# Patient Record
Sex: Male | Born: 1985 | Race: White | Hispanic: No | Marital: Married | Smoking: Never smoker
Health system: Southern US, Community
[De-identification: ages and names within clinical notes are randomized; demographics above are authoritative.]

## PROBLEM LIST (undated history)

## (undated) DIAGNOSIS — F419 Anxiety disorder, unspecified: Secondary | ICD-10-CM

## (undated) DIAGNOSIS — Z87442 Personal history of urinary calculi: Secondary | ICD-10-CM

## (undated) DIAGNOSIS — I10 Essential (primary) hypertension: Secondary | ICD-10-CM

## (undated) DIAGNOSIS — R112 Nausea with vomiting, unspecified: Secondary | ICD-10-CM

## (undated) DIAGNOSIS — F32A Depression, unspecified: Secondary | ICD-10-CM

## (undated) DIAGNOSIS — D649 Anemia, unspecified: Secondary | ICD-10-CM

## (undated) DIAGNOSIS — E559 Vitamin D deficiency, unspecified: Secondary | ICD-10-CM

## (undated) DIAGNOSIS — Z9889 Other specified postprocedural states: Secondary | ICD-10-CM

## (undated) DIAGNOSIS — E785 Hyperlipidemia, unspecified: Secondary | ICD-10-CM

## (undated) DIAGNOSIS — K219 Gastro-esophageal reflux disease without esophagitis: Secondary | ICD-10-CM

## (undated) HISTORY — DX: Hyperlipidemia, unspecified: E78.5

## (undated) HISTORY — PX: OTHER SURGICAL HISTORY: SHX169

## (undated) HISTORY — DX: Essential (primary) hypertension: I10

## (undated) HISTORY — PX: WISDOM TOOTH EXTRACTION: SHX21

## (undated) HISTORY — PX: KNEE SURGERY: SHX244

---

## 2007-01-22 ENCOUNTER — Emergency Department (HOSPITAL_COMMUNITY): Admission: EM | Admit: 2007-01-22 | Discharge: 2007-01-23 | Payer: Self-pay | Admitting: Emergency Medicine

## 2007-02-14 ENCOUNTER — Encounter: Admission: RE | Admit: 2007-02-14 | Discharge: 2007-02-14 | Payer: Self-pay | Admitting: Orthopedic Surgery

## 2009-05-25 ENCOUNTER — Ambulatory Visit: Payer: Self-pay | Admitting: Internal Medicine

## 2009-05-25 DIAGNOSIS — E669 Obesity, unspecified: Secondary | ICD-10-CM | POA: Insufficient documentation

## 2009-05-25 DIAGNOSIS — R519 Headache, unspecified: Secondary | ICD-10-CM | POA: Insufficient documentation

## 2009-05-25 DIAGNOSIS — E785 Hyperlipidemia, unspecified: Secondary | ICD-10-CM | POA: Insufficient documentation

## 2009-05-25 DIAGNOSIS — R030 Elevated blood-pressure reading, without diagnosis of hypertension: Secondary | ICD-10-CM

## 2009-05-25 DIAGNOSIS — R51 Headache: Secondary | ICD-10-CM

## 2009-05-25 DIAGNOSIS — J45909 Unspecified asthma, uncomplicated: Secondary | ICD-10-CM | POA: Insufficient documentation

## 2009-08-15 ENCOUNTER — Ambulatory Visit: Payer: Self-pay | Admitting: Internal Medicine

## 2009-08-15 DIAGNOSIS — R1011 Right upper quadrant pain: Secondary | ICD-10-CM

## 2009-08-18 ENCOUNTER — Encounter: Admission: RE | Admit: 2009-08-18 | Discharge: 2009-08-18 | Payer: Self-pay | Admitting: Internal Medicine

## 2009-08-31 ENCOUNTER — Ambulatory Visit: Payer: Self-pay | Admitting: Internal Medicine

## 2010-06-29 ENCOUNTER — Emergency Department (HOSPITAL_COMMUNITY)
Admission: EM | Admit: 2010-06-29 | Discharge: 2010-06-29 | Payer: Self-pay | Source: Home / Self Care | Admitting: Emergency Medicine

## 2010-07-03 LAB — URINALYSIS, ROUTINE W REFLEX MICROSCOPIC
Nitrite: NEGATIVE
Specific Gravity, Urine: 1.03 (ref 1.005–1.030)
Urine Glucose, Fasting: NEGATIVE mg/dL
pH: 6 (ref 5.0–8.0)

## 2010-07-13 NOTE — Assessment & Plan Note (Signed)
Summary: STOMACH PAIN /NWS   Vital Signs:  Patient profile:   25 year old male Height:      72 inches Weight:      292 pounds O2 Sat:      97 % on Room air Temp:     98.5 degrees F oral Pulse rate:   72 / minute Pulse rhythm:   regular Resp:     16 per minute BP sitting:   120 / 80  (right arm)  O2 Flow:  Room air  Primary Care Provider:  Etta Grandchild MD  CC:  Abdominal pain.  History of Present Illness:  Abdominal Pain      This is a 25 year old man who presents with Abdominal pain.  The symptoms began 3 days ago.  On a scale of 1 to 10, the intensity is described as a 3.  The patient denies nausea, vomiting, diarrhea, constipation, melena, hematochezia, anorexia, and hematemesis.  The location of the pain is epigastric and right upper quadrant.  The pain is described as intermittent, sharp, and cramping in quality.  The patient denies the following symptoms: fever, weight loss, dysuria, chest pain, jaundice, and dark urine.  The pain is worse with food.    Preventive Screening-Counseling & Management  Alcohol-Tobacco     Alcohol drinks/day: 0     Smoking Status: never  Hep-HIV-STD-Contraception     Hepatitis Risk: no risk noted     HIV Risk: no risk noted     STD Risk: no risk noted     SBE monthly: yes     SBE Education/Counseling: to perform regular SBE  Safety-Violence-Falls     Seat Belt Use: yes     Helmet Use: yes     Firearms in the Home: firearms in the home     Firearm Counseling: to practice firearm safety     Smoke Detectors: yes     Violence in the Home: no risk noted     Sexual Abuse: no      Sexual History:  not active.        Drug Use:  no.        Blood Transfusions:  no.    Medications Prior to Update: 1)  None  Current Medications (verified): 1)  None  Allergies (verified): No Known Drug Allergies  Past History:  Past Medical History: Reviewed history from 05/25/2009 and no changes  required. Asthma Headache-migraine Hyperlipidemia  Past Surgical History: Reviewed history from 05/25/2009 and no changes required. Denies surgical history  Family History: Reviewed history from 05/25/2009 and no changes required. Family History Diabetes 1st degree relative Family History High cholesterol Family History Hypertension  Social History: Reviewed history from 05/25/2009 and no changes required. Occupation: Emergency planning/management officer Single Never Smoked Alcohol use-no Drug use-no Regular exercise-yes Seat Belt Use:  yes Sexual History:  not active  Review of Systems       The patient complains of abdominal pain.  The patient denies anorexia, fever, weight loss, weight gain, chest pain, peripheral edema, prolonged cough, headaches, hemoptysis, melena, hematochezia, severe indigestion/heartburn, hematuria, genital sores, suspicious skin lesions, and enlarged lymph nodes.   GI:  Complains of abdominal pain; denies bloody stools, change in bowel habits, constipation, dark tarry stools, diarrhea, indigestion, loss of appetite, nausea, vomiting, vomiting blood, and yellowish skin color. GU:  Denies discharge, dysuria, hematuria, incontinence, nocturia, urinary frequency, and urinary hesitancy.  Physical Exam  General:  alert, well-developed, well-nourished, well-hydrated, appropriate dress, normal appearance, healthy-appearing,  cooperative to examination, good hygiene, and overweight-appearing.   Head:  normocephalic, atraumatic, no abnormalities observed, and no abnormalities palpated.   Eyes:  vision grossly intact, pupils equal, pupils round, and pupils reactive to light.  no icterus. Ears:  R ear normal and L ear normal.   Mouth:  Oral mucosa and oropharynx without lesions or exudates.  Teeth in good repair. Neck:  supple, full ROM, no masses, normal carotid upstroke, no carotid bruits, no cervical lymphadenopathy, and no neck tenderness.   Lungs:  normal respiratory effort, no  intercostal retractions, no accessory muscle use, normal breath sounds, no dullness, and no fremitus.   Heart:  normal rate, regular rhythm, no murmur, no gallop, no rub, no JVD, and no HJR.   Abdomen:  soft, non-tender, normal bowel sounds, no distention, no masses, no guarding, no rigidity, no rebound tenderness, no hepatomegaly, and no splenomegaly.   Rectal:  No external abnormalities noted. Normal sphincter tone. No rectal masses or tenderness. heme neg.stool. Genitalia:  circumcised, no hydrocele, no varicocele, no scrotal masses, no testicular masses or atrophy, no cutaneous lesions, and no urethral discharge.   Prostate:  Prostate gland firm and smooth, no enlargement, nodularity, tenderness, mass, asymmetry or induration. Msk:  No deformity or scoliosis noted of thoracic or lumbar spine.   Pulses:  R and L carotid,radial,femoral,dorsalis pedis and posterior tibial pulses are full and equal bilaterally Extremities:  No clubbing, cyanosis, edema, or deformity noted with normal full range of motion of all joints.   Neurologic:  No cranial nerve deficits noted. Station and gait are normal. Plantar reflexes are down-going bilaterally. DTRs are symmetrical throughout. Sensory, motor and coordinative functions appear intact. Skin:  Intact without suspicious lesions or rashes Cervical Nodes:  no anterior cervical adenopathy and no posterior cervical adenopathy.   Psych:  Cognition and judgment appear intact. Alert and cooperative with normal attention span and concentration. No apparent delusions, illusions, hallucinations   Impression & Recommendations:  Problem # 1:  ABDOMINAL PAIN, RIGHT UPPER QUADRANT (ICD-789.01) Assessment New this sounds like gallstones, he is feeling much better today so no treatment is needed at this time Orders: Radiology Referral (Radiology) Hemoccult Guaiac-1 spec.(in office) ([id number])  Problem # 2:  ELEVATED BLOOD PRESSURE WITHOUT DIAGNOSIS OF HYPERTENSION  (ICD-796.2) Assessment: Improved  BP today: 120/80 Prior BP: 130/90 (05/25/2009)  Instructed in low sodium diet (DASH Handout) and behavior modification.    Orders: Hemoccult Guaiac-1 spec.(in office) ([id number])  Patient Instructions: 1)  Please schedule a follow-up appointment in 2 weeks.

## 2010-07-13 NOTE — Assessment & Plan Note (Signed)
Summary: 2 wk f/u // # / cd   Vital Signs:  Patient profile:   25 year old male Height:      72 inches Weight:      285 pounds BMI:     38.79 O2 Sat:      96 % on Room air Temp:     98.2 degrees F oral Pulse rate:   74 / minute Pulse rhythm:   regular Resp:     16 per minute BP sitting:   120 / 86  (left arm) Cuff size:   large  Vitals Entered By: Rock Nephew CMA (August 31, 2009 3:34 PM)  Nutrition Counseling: Patient's BMI is greater than 25 and therefore counseled on weight management options.  O2 Flow:  Room air CC: follow-up visit 2wk Is Patient Diabetic? No Pain Assessment Patient in pain? no        Primary Care Provider:  Etta Grandchild MD  CC:  follow-up visit 2wk.  History of Present Illness: He returns for a recheck and he says that all of his symptoms have resolved and he feels well. His abd. u/s was normal.  Preventive Screening-Counseling & Management  Alcohol-Tobacco     Alcohol drinks/day: 0     Smoking Status: never  Hep-HIV-STD-Contraception     Hepatitis Risk: no risk noted     HIV Risk: no risk noted     STD Risk: no risk noted     SBE monthly: yes     SBE Education/Counseling: to perform regular SBE      Sexual History:  not active.        Drug Use:  no.        Blood Transfusions:  no.    Medications Prior to Update: 1)  None  Current Medications (verified): 1)  None  Allergies (verified): No Known Drug Allergies  Past History:  Past Medical History: Reviewed history from 05/25/2009 and no changes required. Asthma Headache-migraine Hyperlipidemia  Past Surgical History: Reviewed history from 05/25/2009 and no changes required. Denies surgical history  Family History: Reviewed history from 05/25/2009 and no changes required. Family History Diabetes 1st degree relative Family History High cholesterol Family History Hypertension  Social History: Reviewed history from 05/25/2009 and no changes  required. 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   GI:  Denies abdominal pain, bloody stools, change in bowel habits, diarrhea, gas, indigestion, loss of appetite, nausea, vomiting, and yellowish skin color.  Physical Exam  General:  alert, well-developed, well-nourished, well-hydrated, appropriate dress, normal appearance, healthy-appearing, cooperative to examination, good hygiene, and overweight-appearing.   Head:  normocephalic, atraumatic, no abnormalities observed, and no abnormalities palpated.   Eyes:  vision grossly intact, pupils equal, pupils round, and pupils reactive to light.  no icterus. Mouth:  Oral mucosa and oropharynx without lesions or exudates.  Teeth in good repair. Neck:  supple, full ROM, no masses, normal carotid upstroke, no carotid bruits, no cervical lymphadenopathy, and no neck tenderness.   Lungs:  normal respiratory effort, no intercostal retractions, no accessory muscle use, normal breath sounds, no dullness, and no fremitus.   Heart:  normal rate, regular rhythm, no murmur, no gallop, no rub, no JVD, and no HJR.   Abdomen:  soft, non-tender, normal bowel sounds, no distention, no masses, no  guarding, no rigidity, no rebound tenderness, no hepatomegaly, and no splenomegaly.   Msk:  No deformity or scoliosis noted of thoracic or lumbar spine.   Pulses:  R and L carotid,radial,femoral,dorsalis pedis and posterior tibial pulses are full and equal bilaterally Extremities:  No clubbing, cyanosis, edema, or deformity noted with normal full range of motion of all joints.   Neurologic:  No cranial nerve deficits noted. Station and gait are normal. Plantar reflexes are down-going bilaterally. DTRs  are symmetrical throughout. Sensory, motor and coordinative functions appear intact. Skin:  Intact without suspicious lesions or rashes Cervical Nodes:  no anterior cervical adenopathy and no posterior cervical adenopathy.   Psych:  Cognition and judgment appear intact. Alert and cooperative with normal attention span and concentration. No apparent delusions, illusions, hallucinations   Impression & Recommendations:  Problem # 1:  ABDOMINAL PAIN, RIGHT UPPER QUADRANT (ICD-789.01) Assessment Improved  his symptoms have resolved and there is no clear explanation so will follow for now and he will inform me of any symptoms.  Discussed symptom control with the patient.   Problem # 2:  ELEVATED BLOOD PRESSURE WITHOUT DIAGNOSIS OF HYPERTENSION (ICD-796.2) Assessment: Improved  BP today: 120/86 Prior BP: 120/80 (08/15/2009)  Instructed in low sodium diet (DASH Handout) and behavior modification.    Patient Instructions: 1)  Please schedule a follow-up appointment as needed. 2)  It is important that you exercise regularly at least 20 minutes 5 times a week. If you develop chest pain, have severe difficulty breathing, or feel very tired , stop exercising immediately and seek medical attention. 3)  You need to lose weight. Consider a lower calorie diet and regular exercise.  4)  Check your Blood Pressure regularly. If it is above 140/90: you should make an appointment.

## 2011-02-06 ENCOUNTER — Encounter: Payer: Self-pay | Admitting: Internal Medicine

## 2011-02-06 ENCOUNTER — Other Ambulatory Visit (INDEPENDENT_AMBULATORY_CARE_PROVIDER_SITE_OTHER): Payer: BC Managed Care – PPO

## 2011-02-06 ENCOUNTER — Ambulatory Visit (INDEPENDENT_AMBULATORY_CARE_PROVIDER_SITE_OTHER): Payer: BC Managed Care – PPO | Admitting: Internal Medicine

## 2011-02-06 ENCOUNTER — Other Ambulatory Visit: Payer: Self-pay | Admitting: Internal Medicine

## 2011-02-06 VITALS — BP 140/98 | HR 73 | Temp 97.5°F | Resp 16 | Wt 314.2 lb

## 2011-02-06 DIAGNOSIS — E785 Hyperlipidemia, unspecified: Secondary | ICD-10-CM

## 2011-02-06 DIAGNOSIS — F329 Major depressive disorder, single episode, unspecified: Secondary | ICD-10-CM | POA: Insufficient documentation

## 2011-02-06 DIAGNOSIS — F32A Depression, unspecified: Secondary | ICD-10-CM | POA: Insufficient documentation

## 2011-02-06 DIAGNOSIS — I10 Essential (primary) hypertension: Secondary | ICD-10-CM

## 2011-02-06 DIAGNOSIS — B360 Pityriasis versicolor: Secondary | ICD-10-CM

## 2011-02-06 DIAGNOSIS — E669 Obesity, unspecified: Secondary | ICD-10-CM

## 2011-02-06 LAB — LIPID PANEL
Cholesterol: 237 mg/dL — ABNORMAL HIGH (ref 0–200)
HDL: 38.1 mg/dL — ABNORMAL LOW (ref >39.00)
Total CHOL/HDL Ratio: 6
Triglycerides: 362 mg/dL — ABNORMAL HIGH (ref 0.0–149.0)
VLDL: 72.4 mg/dL — ABNORMAL HIGH (ref 0.0–40.0)

## 2011-02-06 LAB — COMPREHENSIVE METABOLIC PANEL
ALT: 28 U/L (ref 0–53)
AST: 21 U/L (ref 0–37)
Albumin: 4.3 g/dL (ref 3.5–5.2)
BUN: 24 mg/dL — ABNORMAL HIGH (ref 6–23)
CO2: 23 mEq/L (ref 19–32)
Chloride: 104 mEq/L (ref 96–112)
Glucose, Bld: 102 mg/dL — ABNORMAL HIGH (ref 70–99)
Total Protein: 7.3 g/dL (ref 6.0–8.3)

## 2011-02-06 LAB — URINALYSIS, ROUTINE W REFLEX MICROSCOPIC
Bilirubin Urine: NEGATIVE
Hgb urine dipstick: NEGATIVE
Nitrite: NEGATIVE
Specific Gravity, Urine: 1.025 (ref 1.000–1.030)
Total Protein, Urine: NEGATIVE
Urobilinogen, UA: 0.2 (ref 0.0–1.0)

## 2011-02-06 LAB — CBC WITH DIFFERENTIAL/PLATELET
Lymphocytes Relative: 42.4 % (ref 12.0–46.0)
Lymphs Abs: 2.9 10*3/uL (ref 0.7–4.0)
MCHC: 34 g/dL (ref 30.0–36.0)
Monocytes Relative: 6.7 % (ref 3.0–12.0)
Neutro Abs: 3.2 10*3/uL (ref 1.4–7.7)
Neutrophils Relative %: 46.4 % (ref 43.0–77.0)
RBC: 5.41 Mil/uL (ref 4.22–5.81)
WBC: 6.9 10*3/uL (ref 4.5–10.5)

## 2011-02-06 LAB — TSH: TSH: 2.33 u[IU]/mL (ref 0.35–5.50)

## 2011-02-06 MED ORDER — KETOCONAZOLE 200 MG PO TABS: 200.0000 mg | ORAL_TABLET | Freq: Every day | ORAL | Status: DC

## 2011-02-06 MED ORDER — OLMESARTAN MEDOXOMIL 40 MG PO TABS: 40.0000 mg | ORAL_TABLET | Freq: Every day | ORAL | Status: DC

## 2011-02-06 MED ORDER — DULOXETINE HCL 30 MG PO CPEP: 30.0000 mg | ORAL_CAPSULE | Freq: Every day | ORAL | Status: DC

## 2011-02-06 NOTE — Assessment & Plan Note (Signed)
Check labs, A1C

## 2011-02-06 NOTE — Patient Instructions (Signed)
Hypertension (High Blood Pressure) As your heart beats, it forces blood through your arteries. This force is your blood pressure. If the pressure is too high, it is called hypertension (HTN) or high blood pressure. HTN is dangerous because you may have it and not know it. High blood pressure may mean that your heart has to work harder to pump blood. Your arteries may be narrow or stiff. The extra work puts you at risk for heart disease, stroke, and other problems.  Blood pressure consists of two numbers, a higher number over a lower, 110/72, for example. It is stated as "110 over 72." The ideal is below 120 for the top number (systolic) and under 80 for the bottom (diastolic). Write down your blood pressure today. You should pay close attention to your blood pressure if you have certain conditions such as:  Heart failure.  Prior heart attack.   Diabetes   Chronic kidney disease.   Prior stroke.   Multiple risk factors for heart disease.   To see if you have HTN, your blood pressure should be measured while you are seated with your arm held at the level of the heart. It should be measured at least twice. A one-time elevated blood pressure reading (especially in the Emergency Department) does not mean that you need treatment. There may be conditions in which the blood pressure is different between your right and left arms. It is important to see your caregiver soon for a recheck. Most people have essential hypertension which means that there is not a specific cause. This type of high blood pressure may be lowered by changing lifestyle factors such as:  Stress.  Smoking.   Lack of exercise.   Excessive weight.  Drug/tobacco/alcohol use.   Eating less salt.   Most people do not have symptoms from high blood pressure until it has caused damage to the body. Effective treatment can often prevent, delay or reduce that damage. TREATMENT Treatment for high blood pressure, when a cause has been  identified, is directed at the cause. There are a large number of medications to treat HTN. These fall into several categories, and your caregiver will help you select the medicines that are best for you. Medications may have side effects. You should review side effects with your caregiver. If your blood pressure stays high after you have made lifestyle changes or started on medicines,   Your medication(s) may need to be changed.   Other problems may need to be addressed.   Be certain you understand your prescriptions, and know how and when to take your medicine.   Be sure to follow up with your caregiver within the time frame advised (usually within two weeks) to have your blood pressure rechecked and to review your medications.   If you are taking more than one medicine to lower your blood pressure, make sure you know how and at what times they should be taken. Taking two medicines at the same time can result in blood pressure that is too low.  SEEK IMMEDIATE MEDICAL CARE IF YOU DEVELOP:  A severe headache, blurred or changing vision, or confusion.   Unusual weakness or numbness, or a faint feeling.   Severe chest or abdominal pain, vomiting, or breathing problems.  MAKE SURE YOU:   Understand these instructions.   Will watch your condition.   Will get help right away if you are not doing well or get worse.  Document Released: 05/28/2005 Document Re-Released: 11/15/2009 [employer] Patient Information 2011 [employer]   [employer].Depression, Adolescent and Adult Depression is a true and treatable medical condition. In general there are two kinds of depression:  Depression we all experience in some form. For example depression from the death of a loved one, financial distress or natural disasters will trigger or increase depression.   Clinical depression, on the other hand, appears without an apparent cause or reason. This depression is a disease. Depression may be caused by chemical imbalance  in the body and brain or may come as a response to a physical illness. Alcohol and other drugs can cause depression.  DIAGNOSIS  The diagnosis of depression is usually based upon symptoms and medical history. TREATMENT Treatments for depression fall into three categories. These are:  Drug therapy. There are many medicines that treat depression. Responses may vary and sometimes trial and error is necessary to determine the best medicines and dosage for a particular patient.   Psychotherapy, also called talking treatments, helps people resolve their problems by looking at them from a different point of view and by giving people insight into their own personal makeup. Traditional psychotherapy looks at a childhood source of a problem. Other psychotherapy will look at current conflicts and move toward solving those. If the cause of depression is drug use, counseling is available to help abstain. In time the depression will usually improve. If there were underlying causes for the chemical use, they can be addressed.   ECT (electroconvulsive therapy) or shock treatment is not as commonly used today. It is a very effective treatment for severe suicidal depression. During ECT electrical impulses are applied to the head. These impulses cause a generalized seizure. It can be effective but causes a loss of memory for recent events. Sometimes this loss of memory may include the last several months.  Treat all depression or suicide threats as serious. Obtain professional help. Do not wait to see if serious depression will get better over time without help. Seek help for yourself or those around you. In the [place] the number to the National Suicide Help Lines With 24 Hour Help Are: 1-800-SUICIDE 240 591 9320 Document Released: 05/25/2000 Document Re-Released: 08/24/2008 [facility] Patient Information 2011 [place], [place].

## 2011-02-06 NOTE — Assessment & Plan Note (Signed)
Check LFT's and start ketoconazole

## 2011-02-06 NOTE — Assessment & Plan Note (Signed)
Start cymbalta and referred him to [facility]etch for psychotherapy

## 2011-02-06 NOTE — Assessment & Plan Note (Signed)
Check labs, FLP

## 2011-02-06 NOTE — Progress Notes (Signed)
Subjective:    Patient ID: Frederick Snyder, male    DOB: [birth year], 25 y.o.   MRN: [id number]  Hypertension This is a chronic problem. The current episode started more than 1 year ago. The problem has been gradually worsening since onset. The problem is uncontrolled. Associated symptoms include anxiety. Pertinent negatives include no blurred vision, chest pain, headaches, malaise/fatigue, neck pain, orthopnea, palpitations, peripheral edema, PND, shortness of breath or sweats. There are no associated agents to hypertension. Past treatments include nothing. Compliance problems include exercise and diet.   Rash This is a recurrent problem. The current episode started more than 1 year ago. The problem has been gradually worsening since onset. The rash is diffuse. The rash is characterized by itchiness. He was exposed to nothing. Pertinent negatives include no anorexia, congestion, cough, diarrhea, eye pain, fatigue, fever, joint pain, nail changes, rhinorrhea, shortness of breath, sore throat or vomiting. Past treatments include nothing. His past medical history is significant for asthma. There is no history of allergies, eczema or varicella.      Review of Systems  Constitutional: Positive for unexpected weight change. Negative for fever, chills, malaise/fatigue, diaphoresis, activity change, appetite change and fatigue.  HENT: Negative for congestion, sore throat, facial swelling, rhinorrhea, sneezing, drooling, mouth sores, trouble swallowing, neck pain, neck stiffness, dental problem, voice change, postnasal drip and sinus pressure.   Eyes: Negative for blurred vision, photophobia, pain, discharge, redness, itching and visual disturbance.  Respiratory: Negative for apnea, cough, choking, chest tightness, shortness of breath, wheezing and stridor.   Cardiovascular: Negative for chest pain, palpitations, orthopnea, leg swelling and PND.  Gastrointestinal: Negative for nausea, vomiting,  abdominal pain, diarrhea, blood in stool, abdominal distention, anal bleeding and anorexia.  Genitourinary: Negative for dysuria, urgency, frequency, hematuria, flank pain, decreased urine volume, enuresis and difficulty urinating.  Musculoskeletal: Negative for myalgias, back pain, joint pain, joint swelling, arthralgias and gait problem.  Skin: Positive for color change and rash. Negative for nail changes, pallor and wound.  Neurological: Negative for dizziness, tremors, seizures, syncope, facial asymmetry, speech difficulty, weakness, light-headedness, numbness and headaches.  Hematological: Negative for adenopathy. Does not bruise/bleed easily.  Psychiatric/Behavioral: Positive for behavioral problems (lack of focus), sleep disturbance (DFA), dysphoric mood (sadness and crying spells) and agitation (irritable and angry). Negative for suicidal ideas, hallucinations, confusion, self-injury and decreased concentration. The patient is nervous/anxious. The patient is not hyperactive.        Objective:   Physical Exam  Vitals reviewed. Constitutional: He is oriented to person, place, and time. He appears well-developed and well-nourished. No distress.  HENT:  Head: Normocephalic and atraumatic.  Right Ear: External ear normal.  Left Ear: External ear normal.  Nose: Nose normal.  Mouth/Throat: No oropharyngeal exudate.  Eyes: Conjunctivae are normal. Right eye exhibits no discharge. Left eye exhibits no discharge. No scleral icterus.  Neck: Normal range of motion. Neck supple. No JVD present. No tracheal deviation present. No thyromegaly present.  Cardiovascular: Normal rate, regular rhythm, normal heart sounds and intact distal pulses.  Exam reveals no gallop and no friction rub.   No murmur heard. Pulmonary/Chest: Effort normal and breath sounds normal. No stridor. No respiratory distress. He has no wheezes.  Abdominal: Soft. Bowel sounds are normal. He exhibits no distension and no mass.  There is no tenderness. There is no rebound and no guarding.  Musculoskeletal: Normal range of motion. He exhibits no edema and no tenderness.  Lymphadenopathy:    He has no cervical adenopathy.  Neurological: He  is alert and oriented to person, place, and time. He has normal reflexes. He displays normal reflexes. He exhibits normal muscle tone. Coordination normal.  Skin: Skin is warm, dry and intact. Rash noted. No abrasion, no bruising, no burn, no ecchymosis, no laceration, no lesion and no petechiae noted. Rash is macular. Rash is not papular, not maculopapular, not nodular, not pustular, not vesicular and not urticarial. He is not diaphoretic. No cyanosis or erythema. No pallor. Nails show no clubbing.       He has tan colored, slightly hyperpigmented macules covering his torso, back, and arms. There is no involvement of the palms or soles and no involvement of the web spaces.  Psychiatric: His behavior is normal. Judgment and thought content normal. His mood appears anxious. His affect is not angry, not blunt, not labile and not inappropriate. His speech is rapid and/or pressured. His speech is not delayed, not tangential and not slurred. He is not agitated, not aggressive, is not hyperactive, not slowed, not withdrawn, not actively hallucinating and not combative. Thought content is not paranoid and not delusional. Cognition and memory are normal. Cognition and memory are not impaired. He does not express impulsivity or inappropriate judgment. He exhibits a depressed mood. He expresses no homicidal and no suicidal ideation. He expresses no suicidal plans and no homicidal plans. He is communicative. He exhibits normal remote memory. He is attentive.          Assessment & Plan:

## 2011-02-06 NOTE — Assessment & Plan Note (Signed)
I will check labs to look for secondary causes and to look for end organ damage, also will start him on benicar

## 2011-02-07 ENCOUNTER — Encounter: Payer: Self-pay | Admitting: Internal Medicine

## 2011-03-12 ENCOUNTER — Ambulatory Visit (INDEPENDENT_AMBULATORY_CARE_PROVIDER_SITE_OTHER): Payer: BC Managed Care – PPO | Admitting: Internal Medicine

## 2011-03-12 ENCOUNTER — Encounter: Payer: Self-pay | Admitting: Internal Medicine

## 2011-03-12 ENCOUNTER — Other Ambulatory Visit: Payer: Self-pay | Admitting: Internal Medicine

## 2011-03-12 ENCOUNTER — Other Ambulatory Visit (INDEPENDENT_AMBULATORY_CARE_PROVIDER_SITE_OTHER): Payer: BC Managed Care – PPO

## 2011-03-12 DIAGNOSIS — E785 Hyperlipidemia, unspecified: Secondary | ICD-10-CM

## 2011-03-12 DIAGNOSIS — R5383 Other fatigue: Secondary | ICD-10-CM

## 2011-03-12 DIAGNOSIS — I10 Essential (primary) hypertension: Secondary | ICD-10-CM

## 2011-03-12 DIAGNOSIS — R5381 Other malaise: Secondary | ICD-10-CM

## 2011-03-12 DIAGNOSIS — F329 Major depressive disorder, single episode, unspecified: Secondary | ICD-10-CM

## 2011-03-12 LAB — LIPID PANEL
Cholesterol: 237 mg/dL — ABNORMAL HIGH (ref 0–200)
Triglycerides: 202 mg/dL — ABNORMAL HIGH (ref 0.0–149.0)

## 2011-03-12 MED ORDER — DULOXETINE HCL 60 MG PO CPEP: 60.0000 mg | ORAL_CAPSULE | Freq: Every day | ORAL | Status: DC

## 2011-03-12 MED ORDER — OLMESARTAN MEDOXOMIL 40 MG PO TABS: 40.0000 mg | ORAL_TABLET | Freq: Every day | ORAL | Status: DC

## 2011-03-12 NOTE — Assessment & Plan Note (Signed)
BP is well controlled 

## 2011-03-12 NOTE — Assessment & Plan Note (Signed)
I will recheck his FLP to see if he has made any improvements

## 2011-03-12 NOTE — Patient Instructions (Signed)
Hypertriglyceridemia    Diet for High blood levels of Triglycerides  Most fats in food are triglycerides. Triglycerides in your blood are stored as fat in your body. High levels of triglycerides in your blood may put you at a greater risk for heart disease and stroke.    Normal triglyceride levels are less than 150 mg/dL. Borderline high levels are 150-199 mg/dl. High levels are 200 - 499 mg/dL, and very high triglyceride levels are greater than 500 mg/dL. The decision to treat high triglycerides is generally based on the level. For people with borderline or high triglyceride levels, treatment includes weight loss and exercise. Drugs are recommended for people with very high triglyceride levels.  Many people who need treatment for high triglyceride levels have metabolic syndrome. This syndrome is a collection of disorders that often include: insulin resistance, high blood pressure, blood clotting problems, high cholesterol and triglycerides.  TESTING PROCEDURE FOR TRIGLYCERIDES   You should not eat 4 hours before getting your triglycerides measured. The normal range of triglycerides is between 10 and 250 milligrams per deciliter (mg/dl). Some people may have extreme levels (1000 or above), but your triglyceride level may be too high if it is above 150 mg/dl, depending on what other risk factors you have for heart disease.    People with high blood triglycerides may also have high blood cholesterol levels. If you have high blood cholesterol as well as high blood triglycerides, your risk for heart disease is probably greater than if you only had high triglycerides. High blood cholesterol is one of the main risk factors for heart disease.   CHANGING YOUR DIET     Your weight can affect your blood triglyceride level. If you are more than 20% above your ideal body weight, you may be able to lower your blood triglycerides by losing weight. Eating less and exercising regularly is the best way to combat this. Fat provides more calories than any other food. The best way to lose weight is to eat less fat. Only 30% of your total calories should come from fat. Less than 7% of your diet should come from saturated fat. A diet low in fat and saturated fat is the same as a diet to decrease blood cholesterol. By eating a diet lower in fat, you may lose weight, lower your blood cholesterol, and lower your blood triglyceride level.    Eating a diet low in fat, especially saturated fat, may also help you lower your blood triglyceride level. Ask your dietitian to help you figure how much fat you can eat based on the number of calories your caregiver has prescribed for you.    Exercise, in addition to helping with weight loss may also help lower triglyceride levels.    Alcohol can increase blood triglycerides. You may need to stop drinking alcoholic beverages.    Too much carbohydrate in your diet may also increase your blood triglycerides. Some complex carbohydrates are necessary in your diet. These may include bread, rice, potatoes, other starchy vegetables and cereals.    Reduce "simple" carbohydrates. These may include pure sugars, candy, honey, and jelly without losing other nutrients. If you have the kind of high blood triglycerides that is affected by the amount of carbohydrates in your diet, you will need to eat less sugar and less high-sugar foods. Your caregiver can help you with this.    Adding 2-4 grams of fish oil (EPA+ DHA) may also help lower triglycerides. Speak with your caregiver before adding any supplements to   your regimen.   Following the Diet    Maintain your ideal weight. Your caregivers can help you with a diet. Generally, eating less food and getting more exercise will help you lose weight. Joining a weight control group may also help. Ask your caregivers for a good weight control group in your area.    Eat low-fat foods instead of high-fat foods. This can help you lose weight too.    These foods are lower in fat. Eat MORE of these:    Dried beans, peas, and lentils.      Egg whites.      Low-fat cottage cheese.      Fish.     Lean cuts of meat, such as round, sirloin, rump, and flank (cut extra fat off meat you fix).     Whole grain breads, cereals and pasta.      Skim and nonfat dry milk.      Low-fat yogurt.      Poultry without the skin.      Cheese made with skim or part-skim milk, such as mozzarella, parmesan, farmers', ricotta, or pot cheese.      These are higher fat foods. Eat LESS of these:    Whole milk and foods made from whole milk, such as American, blue, cheddar, monterey jack, and swiss cheese    High-fat meats, such as luncheon meats, sausages, knockwurst, bratwurst, hot dogs, ribs, corned beef, ground pork, and regular ground beef.    Fried foods.   Limit saturated fats in your diet. Substituting unsaturated fat for saturated fat may decrease your blood triglyceride level. You will need to read package labels to know which products contain saturated fats.    These foods are high in saturated fat. Eat LESS of these:    Fried pork skins.    Whole milk.      Skin and fat from poultry.      Palm oil.      Butter.     Shortening.     Cream cheese.      Bacon.     Margarines and baked goods made from listed oils.      Vegetable shortenings.     Chitterlings.    Fat from meats.      Coconut oil.      Palm kernel oil.      Lard.     Cream.     Sour cream.      Fatback.     Coffee whiteners and non-dairy creamers made with these oils.      Cheese made from whole milk.       Use unsaturated fats (both polyunsaturated and monounsaturated) moderately. Remember, even though unsaturated fats are better than saturated fats; you still want a diet low in total fat.    These foods are high in unsaturated fat:    Canola oil.    Sunflower oil.      Mayonnaise.     Almonds.     Peanuts.     Pine nuts.      Margarines made with these oils.     Safflower oil.    Olive oil.      Avocados.     Cashews.     Peanut butter.      Sunflower seeds.     Soybean oil.    Peanut oil.      Olives.     Pecans.     Walnuts.     Pumpkin seeds.        Avoid sugar and other high-sugar foods. This will decrease carbohydrates without decreasing other nutrients. Sugar in your food goes rapidly to your blood. When there is excess sugar in your blood, your liver may use it to make more triglycerides. Sugar also contains calories without other important nutrients.    Eat LESS of these:    Sugar, brown sugar, powdered sugar, jam, jelly, preserves, honey, syrup, molasses, pies, candy, cakes, cookies, frosting, pastries, colas, soft drinks, punches, fruit drinks, and regular gelatin.    Avoid alcohol. Alcohol, even more than sugar, may increase blood triglycerides. In addition, alcohol is high in calories and low in nutrients. Ask for sparkling water, or a diet soft drink instead of an alcoholic beverage.   Suggestions for planning and preparing meals    Bake, broil, grill or roast meats instead of frying.    Remove fat from meats and skin from poultry before cooking.    Add spices, herbs, lemon juice or vinegar to vegetables instead of salt, rich sauces or gravies.    Use a non-stick skillet without fat or use no-stick sprays.    Cool and refrigerate stews and broth. Then remove the hardened fat floating on the surface before serving.    Refrigerate meat drippings and skim off fat to make low-fat gravies.    Serve more fish.     Use less butter, margarine and other high-fat spreads on bread or vegetables.    Use skim or reconstituted non-fat dry milk for cooking.    Cook with low-fat cheeses.    Substitute low-fat yogurt or cottage cheese for all or part of the sour cream in recipes for sauces, dips or congealed salads.    Use half yogurt/half mayonnaise in salad recipes.    Substitute evaporated skim milk for cream. Evaporated skim milk or reconstituted non-fat dry milk can be whipped and substituted for whipped cream in certain recipes.    Choose fresh fruits for dessert instead of high-fat foods such as pies or cakes. Fruits are naturally low in fat.   When Dining Out    Order low-fat appetizers such as fruit or vegetable juice, pasta with vegetables or tomato sauce.    Select clear, rather than cream soups.    Ask that dressings and gravies be served on the side. Then use less of them.    Order foods that are baked, broiled, poached, steamed, stir-fried, or roasted.    Ask for margarine instead of butter, and use only a small amount.    Drink sparkling water, unsweetened tea or coffee, or diet soft drinks instead of alcohol or other sweet beverages.   QUESTIONS AND ANSWERS ABOUT OTHER FATS IN THE BLOOD:   SATURATED FAT, TRANS FAT, AND CHOLESTEROL  What is trans fat?  Trans fat is a type of fat that is formed when vegetable oil is hardened through a process called hydrogenation. This process helps makes foods more solid, gives them shape, and prolongs their shelf life. Trans fats are also called hydrogenated or partially hydrogenated oils.    What do saturated fat, trans fat, and cholesterol in foods have to do with heart disease?  Saturated fat, trans fat, and cholesterol in the diet all raise the level of LDL "bad" cholesterol in the blood. The higher the LDL cholesterol, the greater the risk for coronary heart disease (CHD). Saturated fat and trans fat raise LDL similarly.     What foods contain saturated fat, trans fat, and cholesterol?  High amounts of saturated fat   are found in animal products, such as fatty cuts of meat, chicken skin, and full-fat dairy products like butter, whole milk, cream, and cheese, and in tropical vegetable oils such as palm, palm kernel, and coconut oil. Trans fat is found in some of the same foods as saturated fat, such as vegetable shortening, some margarines (especially hard or stick margarine), crackers, cookies, baked goods, fried foods, salad dressings, and other processed foods made with partially hydrogenated vegetable oils. Small amounts of trans fat also occur naturally in some animal products, such as milk products, beef, and lamb. Foods high in cholesterol include liver, other organ meats, egg yolks, shrimp, and full-fat dairy products.  How can I use the new food label to make heart-healthy food choices?  Check the Nutrition Facts panel of the food label. Choose foods lower in saturated fat, trans fat, and cholesterol. For saturated fat and cholesterol, you can also use the Percent Daily Value (%DV): 5% DV or less is low, and 20% DV or more is high. (There is no %DV for trans fat.) Use the Nutrition Facts panel to choose foods low in saturated fat and cholesterol, and if the trans fat is not listed, read the ingredients and limit products that list shortening or hydrogenated or partially hydrogenated vegetable oil, which tend to be high in trans fat.  POINTS TO REMEMBER: YOU NEED A LITTLE TLC (THERAPEUTIC LIFESTYLE CHANGES)   Discuss your risk for heart disease with your caregivers, and take steps to reduce risk factors.    Change your diet. Choose foods that are low in saturated fat, trans fat, and cholesterol.     Add exercise to your daily routine if it is not already being done. Participate in physical activity of moderate intensity, like brisk walking, for at least 30 minutes on most, and preferably all days of the week. No time? Break the 30 minutes into three, 10-minute segments during the day.    Stop smoking. If you do smoke, contact your caregiver to discuss ways in which they can help you quit.    Do not use street drugs.    Maintain a normal weight.    Maintain a healthy blood pressure.    Keep up with your blood work for checking the fats in your blood as directed by your caregiver.   Document Released: 03/15/2004 Document Re-Released: 11/15/2009  [employer] Patient Information 2011 [employer].

## 2011-03-12 NOTE — Assessment & Plan Note (Signed)
He has improved on the 30 mg dose of cymbalta, I will increase the dose to 60 mg to see if he will continue to improve

## 2011-03-12 NOTE — Progress Notes (Signed)
Subjective:    Patient ID: Frederick Snyder, male    DOB: [birth year], 25 y.o.   MRN: [id number]  Hypertension This is a chronic problem. The current episode started more than 1 year ago. The problem has been gradually improving since onset. The problem is controlled. Pertinent negatives include no anxiety, blurred vision, chest pain, headaches, malaise/fatigue, neck pain, orthopnea, palpitations, peripheral edema, PND, shortness of breath or sweats. Past treatments include angiotensin blockers. The current treatment provides moderate improvement. Compliance problems include exercise and diet.       Review of Systems  Constitutional: Positive for fatigue. Negative for fever, chills, malaise/fatigue, diaphoresis, activity change, appetite change and unexpected weight change.  HENT: Negative for sore throat, facial swelling, trouble swallowing, neck pain and voice change.   Eyes: Negative.  Negative for blurred vision.  Respiratory: Negative for apnea, cough, choking, chest tightness, shortness of breath, wheezing and stridor.   Cardiovascular: Negative for chest pain, palpitations, orthopnea, leg swelling and PND.  Gastrointestinal: Negative for nausea, vomiting, abdominal pain, diarrhea, constipation, blood in stool, abdominal distention, anal bleeding and rectal pain.  Genitourinary: Negative for dysuria, urgency, frequency, hematuria, flank pain, decreased urine volume, enuresis and difficulty urinating.  Musculoskeletal: Negative for myalgias, back pain, joint swelling, arthralgias and gait problem.  Skin: Negative for color change, pallor, rash and wound.  Neurological: Negative for dizziness, tremors, seizures, syncope, facial asymmetry, speech difficulty, weakness, light-headedness, numbness and headaches.  Hematological: Negative for adenopathy. Does not bruise/bleed easily.  Psychiatric/Behavioral: Positive for dysphoric mood. Negative for suicidal ideas, hallucinations, behavioral  problems, confusion, sleep disturbance, self-injury, decreased concentration and agitation. The patient is not nervous/anxious and is not hyperactive.        Objective:   Physical Exam  Vitals reviewed. Constitutional: He is oriented to person, place, and time. He appears well-developed and well-nourished. No distress.  HENT:  Mouth/Throat: Oropharynx is clear and moist. No oropharyngeal exudate.  Eyes: Conjunctivae are normal. Right eye exhibits no discharge. Left eye exhibits no discharge. No scleral icterus.  Neck: Normal range of motion. Neck supple. No JVD present. No tracheal deviation present. No thyromegaly present.  Cardiovascular: Normal rate, regular rhythm, normal heart sounds and intact distal pulses.  Exam reveals no gallop and no friction rub.   No murmur heard. Pulmonary/Chest: Effort normal and breath sounds normal. No stridor. No respiratory distress. He has no wheezes. He has no rales. He exhibits no tenderness.  Abdominal: Soft. Bowel sounds are normal. He exhibits no distension and no mass. There is no tenderness. There is no rebound and no guarding.  Musculoskeletal: Normal range of motion. He exhibits no edema and no tenderness.  Lymphadenopathy:    He has no cervical adenopathy.  Neurological: He is oriented to person, place, and time. He displays normal reflexes. He exhibits normal muscle tone. Coordination normal.  Skin: Skin is warm and dry. No rash noted. He is not diaphoretic. No erythema. No pallor.  Psychiatric: He has a normal mood and affect. His behavior is normal. Judgment and thought content normal.      Lab Results  Component Value Date   WBC 6.9 02/06/2011   HGB 15.6 02/06/2011   HCT 45.8 02/06/2011   PLT 241.0 02/06/2011   CHOL 237* 02/06/2011   TRIG 362.0* 02/06/2011   HDL 38.10* 02/06/2011   LDLDIRECT 141.2 02/06/2011   ALT 28 02/06/2011   AST 21 02/06/2011   NA 139 02/06/2011   K 3.8 02/06/2011   CL 104 02/06/2011   CREATININE  1.0 02/06/2011   BUN  24* 02/06/2011   CO2 23 02/06/2011   TSH 2.33 02/06/2011   HGBA1C 5.5 02/06/2011      Assessment & Plan:

## 2011-03-12 NOTE — Assessment & Plan Note (Signed)
I will check his testosterone level 

## 2011-03-13 ENCOUNTER — Encounter: Payer: Self-pay | Admitting: Internal Medicine

## 2011-03-13 LAB — TESTOSTERONE, FREE, TOTAL, SHBG
Sex Hormone Binding: 12 nmol/L — ABNORMAL LOW (ref 13–71)
Testosterone, Free: 81.4 pg/mL (ref 47.0–244.0)
Testosterone-% Free: 3 % — ABNORMAL HIGH (ref 1.6–2.9)

## 2011-03-14 ENCOUNTER — Encounter: Payer: Self-pay | Admitting: Internal Medicine

## 2011-06-29 ENCOUNTER — Ambulatory Visit: Payer: BC Managed Care – PPO | Admitting: Internal Medicine

## 2011-07-05 ENCOUNTER — Encounter: Payer: Self-pay | Admitting: Internal Medicine

## 2011-07-05 ENCOUNTER — Ambulatory Visit (INDEPENDENT_AMBULATORY_CARE_PROVIDER_SITE_OTHER): Payer: BC Managed Care – PPO | Admitting: Internal Medicine

## 2011-07-05 ENCOUNTER — Other Ambulatory Visit (INDEPENDENT_AMBULATORY_CARE_PROVIDER_SITE_OTHER): Payer: BC Managed Care – PPO

## 2011-07-05 DIAGNOSIS — R5381 Other malaise: Secondary | ICD-10-CM

## 2011-07-05 DIAGNOSIS — F329 Major depressive disorder, single episode, unspecified: Secondary | ICD-10-CM

## 2011-07-05 DIAGNOSIS — R5383 Other fatigue: Secondary | ICD-10-CM

## 2011-07-05 DIAGNOSIS — E669 Obesity, unspecified: Secondary | ICD-10-CM

## 2011-07-05 DIAGNOSIS — I10 Essential (primary) hypertension: Secondary | ICD-10-CM

## 2011-07-05 DIAGNOSIS — E785 Hyperlipidemia, unspecified: Secondary | ICD-10-CM

## 2011-07-05 LAB — COMPREHENSIVE METABOLIC PANEL
ALT: 22 U/L (ref 0–53)
Albumin: 4.2 g/dL (ref 3.5–5.2)
CO2: 26 mEq/L (ref 19–32)
Calcium: 9.2 mg/dL (ref 8.4–10.5)
Chloride: 105 mEq/L (ref 96–112)
GFR: 118.1 mL/min (ref >60.00)
Glucose, Bld: 97 mg/dL (ref 70–99)
Potassium: 4.1 mEq/L (ref 3.5–5.1)
Sodium: 137 mEq/L (ref 135–145)
Total Protein: 7.1 g/dL (ref 6.0–8.3)

## 2011-07-05 LAB — CBC WITH DIFFERENTIAL/PLATELET
Basophils Absolute: 0 10*3/uL (ref 0.0–0.1)
Basophils Relative: 0.5 % (ref 0.0–3.0)
Eosinophils Absolute: 0.2 10*3/uL (ref 0.0–0.7)
Eosinophils Relative: 3.7 % (ref 0.0–5.0)
HCT: 42.6 % (ref 39.0–52.0)
Hemoglobin: 14.8 g/dL (ref 13.0–17.0)
MCV: 84.8 fl (ref 78.0–100.0)
Monocytes Absolute: 0.4 10*3/uL (ref 0.1–1.0)
Neutrophils Relative %: 45.4 % (ref 43.0–77.0)
Platelets: 232 10*3/uL (ref 150.0–400.0)
RBC: 5.03 Mil/uL (ref 4.22–5.81)
RDW: 12.9 % (ref 11.5–14.6)
WBC: 5.8 10*3/uL (ref 4.5–10.5)

## 2011-07-05 LAB — LIPID PANEL: Triglycerides: 353 mg/dL — ABNORMAL HIGH (ref 0.0–149.0)

## 2011-07-05 LAB — TSH: TSH: 0.84 u[IU]/mL (ref 0.35–5.50)

## 2011-07-05 MED ORDER — VILAZODONE HCL 10 & 20 & 40 MG PO KIT: 1.0000 | PACK | Freq: Every day | ORAL | Status: DC

## 2011-07-05 NOTE — Assessment & Plan Note (Signed)
I will recheck his testosterone level and other labs that could explain his fatigue

## 2011-07-05 NOTE — Patient Instructions (Signed)
Hypertension As your heart beats, it forces blood through your arteries. This force is your blood pressure. If the pressure is too high, it is called hypertension (HTN) or high blood pressure. HTN is dangerous because you may have it and not know it. High blood pressure may mean that your heart has to work harder to pump blood. Your arteries may be narrow or stiff. The extra work puts you at risk for heart disease, stroke, and other problems.  Blood pressure consists of two numbers, a higher number over a lower, 110/72, for example. It is stated as "110 over 72." The ideal is below 120 for the top number (systolic) and under 80 for the bottom (diastolic). Write down your blood pressure today. You should pay close attention to your blood pressure if you have certain conditions such as:  Heart failure.   Prior heart attack.   Diabetes   Chronic kidney disease.   Prior stroke.   Multiple risk factors for heart disease.  To see if you have HTN, your blood pressure should be measured while you are seated with your arm held at the level of the heart. It should be measured at least twice. A one-time elevated blood pressure reading (especially in the Emergency Department) does not mean that you need treatment. There may be conditions in which the blood pressure is different between your right and left arms. It is important to see your caregiver soon for a recheck. Most people have essential hypertension which means that there is not a specific cause. This type of high blood pressure may be lowered by changing lifestyle factors such as:  Stress.   Smoking.   Lack of exercise.   Excessive weight.   Drug/tobacco/alcohol use.   Eating less salt.  Most people do not have symptoms from high blood pressure until it has caused damage to the body. Effective treatment can often prevent, delay or reduce that damage. TREATMENT  When a cause has been identified, treatment for high blood pressure is  directed at the cause. There are a large number of medications to treat HTN. These fall into several categories, and your caregiver will help you select the medicines that are best for you. Medications may have side effects. You should review side effects with your caregiver. If your blood pressure stays high after you have made lifestyle changes or started on medicines,   Your medication(s) may need to be changed.   Other problems may need to be addressed.   Be certain you understand your prescriptions, and know how and when to take your medicine.   Be sure to follow up with your caregiver within the time frame advised (usually within two weeks) to have your blood pressure rechecked and to review your medications.   If you are taking more than one medicine to lower your blood pressure, make sure you know how and at what times they should be taken. Taking two medicines at the same time can result in blood pressure that is too low.  SEEK IMMEDIATE MEDICAL CARE IF:  You develop a severe headache, blurred or changing vision, or confusion.   You have unusual weakness or numbness, or a faint feeling.   You have severe chest or abdominal pain, vomiting, or breathing problems.  MAKE SURE YOU:   Understand these instructions.   Will watch your condition.   Will get help right away if you are not doing well or get worse.  Document Released: 05/28/2005 Document Revised: 02/07/2011 Document Reviewed:   01/16/2008 Exit[employer] Patient Information 2012 [employer].Depression, Adolescent and Adult Depression is a true and treatable medical condition. In general there are two kinds of depression:  Depression we all experience in some form. For example depression from the death of a loved one, financial distress or natural disasters will trigger or increase depression.   Clinical depression, on the other hand, appears without an apparent cause or reason. This depression is a disease. Depression may be  caused by chemical imbalance in the body and brain or may come as a response to a physical illness. Alcohol and other drugs can cause depression.  DIAGNOSIS  The diagnosis of depression is usually based upon symptoms and medical history. TREATMENT  Treatments for depression fall into three categories. These are:  Drug therapy. There are many medicines that treat depression. Responses may vary and sometimes trial and error is necessary to determine the best medicines and dosage for a particular patient.   Psychotherapy, also called talking treatments, helps people resolve their problems by looking at them from a different point of view and by giving people insight into their own personal makeup. Traditional psychotherapy looks at a childhood source of a problem. Other psychotherapy will look at current conflicts and move toward solving those. If the cause of depression is drug use, counseling is available to help abstain. In time the depression will usually improve. If there were underlying causes for the chemical use, they can be addressed.   ECT (electroconvulsive therapy) or shock treatment is not as commonly used today. It is a very effective treatment for severe suicidal depression. During ECT electrical impulses are applied to the head. These impulses cause a generalized seizure. It can be effective but causes a loss of memory for recent events. Sometimes this loss of memory may include the last several months.  Treat all depression or suicide threats as serious. Obtain professional help. Do not wait to see if serious depression will get better over time without help. Seek help for yourself or those around you. In the [place] the number to the National Suicide Help Lines With 24 Hour Help Are: 1-800-SUICIDE 1-800-784-2433 Document Released: 05/25/2000 Document Revised: 02/07/2011 Document Reviewed: 01/14/2008 [employer] Patient Information 2012 [employer]. 

## 2011-07-05 NOTE — Assessment & Plan Note (Signed)
He wants to change his antidepressant so I obliged him and gave him samples of Viibryd

## 2011-07-05 NOTE — Assessment & Plan Note (Signed)
He has not been doing any lifestyle modifications so I encouraged him to start today

## 2011-07-05 NOTE — Progress Notes (Signed)
Subjective:    Patient ID: Frederick Snyder, male    DOB: September 02, 1985, 26 y.o.   MRN: [id number]  Hypertension This is a chronic problem. The current episode started more than 1 year ago. The problem is unchanged. The problem is controlled. Pertinent negatives include no anxiety, blurred vision, chest pain, headaches, malaise/fatigue, neck pain, orthopnea, palpitations, peripheral edema, PND, shortness of breath or sweats. Past treatments include angiotensin blockers. The current treatment provides significant improvement. Compliance problems include exercise and diet.       Review of Systems  Constitutional: Positive for fatigue and unexpected weight change (weight gain). Negative for fever, chills, malaise/fatigue, diaphoresis, activity change and appetite change.  HENT: Negative.  Negative for neck pain.   Eyes: Negative.  Negative for blurred vision.  Respiratory: Negative for cough, chest tightness, shortness of breath, wheezing and stridor.   Cardiovascular: Negative for chest pain, palpitations, orthopnea, leg swelling and PND.  Gastrointestinal: Negative for nausea, vomiting, abdominal pain, diarrhea, constipation, blood in stool and abdominal distention.  Genitourinary: Negative for dysuria, urgency, frequency, hematuria, flank pain, decreased urine volume, enuresis, difficulty urinating and genital sores.  Musculoskeletal: Negative for myalgias, back pain, joint swelling, arthralgias and gait problem.  Skin: Negative for color change, pallor, rash and wound.  Neurological: Negative for dizziness, tremors, seizures, syncope, facial asymmetry, speech difficulty, weakness, light-headedness, numbness and headaches.  Hematological: Negative for adenopathy. Does not bruise/bleed easily.  Psychiatric/Behavioral: Positive for dysphoric mood (feels sad but not hopeless or helpless) and agitation (irritable). Negative for suicidal ideas, hallucinations, behavioral problems, confusion, sleep  disturbance, self-injury and decreased concentration. The patient is not nervous/anxious and is not hyperactive.        Objective:   Physical Exam  Vitals reviewed. Constitutional: He is oriented to person, place, and time. He appears well-developed and well-nourished. No distress.  HENT:  Head: Normocephalic and atraumatic.  Mouth/Throat: Oropharynx is clear and moist. No oropharyngeal exudate.  Eyes: Conjunctivae are normal. Right eye exhibits no discharge. Left eye exhibits no discharge. No scleral icterus.  Neck: Normal range of motion. Neck supple. No JVD present. No tracheal deviation present. No thyromegaly present.  Cardiovascular: Normal rate, regular rhythm, normal heart sounds and intact distal pulses.  Exam reveals no gallop and no friction rub.   No murmur heard. Pulmonary/Chest: Effort normal and breath sounds normal. No stridor. No respiratory distress. He has no wheezes. He has no rales. He exhibits no tenderness.  Abdominal: Soft. Bowel sounds are normal. He exhibits no distension and no mass. There is no tenderness. There is no rebound and no guarding.  Musculoskeletal: Normal range of motion. He exhibits no edema and no tenderness.  Lymphadenopathy:    He has no cervical adenopathy.  Neurological: He is oriented to person, place, and time.  Skin: Skin is warm and dry. No rash noted. He is not diaphoretic. No erythema. No pallor.  Psychiatric: He has a normal mood and affect. His behavior is normal. Judgment and thought content normal.     Lab Results  Component Value Date   WBC 6.9 02/06/2011   HGB 15.6 02/06/2011   HCT 45.8 02/06/2011   PLT 241.0 02/06/2011   GLUCOSE 102* 02/06/2011   CHOL 237* 03/12/2011   TRIG 202.0* 03/12/2011   HDL 44.40 03/12/2011   LDLDIRECT 172.1 03/12/2011   ALT 28 02/06/2011   AST 21 02/06/2011   NA 139 02/06/2011   K 3.8 02/06/2011   CL 104 02/06/2011   CREATININE 1.0 02/06/2011   BUN  24* 02/06/2011   CO2 23 02/06/2011   TSH 2.33 02/06/2011    HGBA1C 5.5 02/06/2011       Assessment & Plan:

## 2011-07-05 NOTE — Assessment & Plan Note (Signed)
I will check his FLP 

## 2011-07-06 ENCOUNTER — Encounter: Payer: Self-pay | Admitting: Internal Medicine

## 2011-07-06 LAB — TESTOSTERONE, FREE, TOTAL, SHBG: Testosterone, Free: 57.2 pg/mL (ref 47.0–244.0)

## 2011-08-03 ENCOUNTER — Encounter: Payer: Self-pay | Admitting: Internal Medicine

## 2011-08-03 ENCOUNTER — Other Ambulatory Visit (INDEPENDENT_AMBULATORY_CARE_PROVIDER_SITE_OTHER): Payer: BC Managed Care – PPO

## 2011-08-03 ENCOUNTER — Ambulatory Visit (INDEPENDENT_AMBULATORY_CARE_PROVIDER_SITE_OTHER): Payer: BC Managed Care – PPO | Admitting: Internal Medicine

## 2011-08-03 VITALS — BP 124/70 | HR 71 | Temp 97.3°F | Resp 16 | Wt 337.0 lb

## 2011-08-03 DIAGNOSIS — I10 Essential (primary) hypertension: Secondary | ICD-10-CM

## 2011-08-03 DIAGNOSIS — E785 Hyperlipidemia, unspecified: Secondary | ICD-10-CM

## 2011-08-03 DIAGNOSIS — F32A Depression, unspecified: Secondary | ICD-10-CM

## 2011-08-03 DIAGNOSIS — F329 Major depressive disorder, single episode, unspecified: Secondary | ICD-10-CM

## 2011-08-03 DIAGNOSIS — E291 Testicular hypofunction: Secondary | ICD-10-CM

## 2011-08-03 DIAGNOSIS — R5383 Other fatigue: Secondary | ICD-10-CM

## 2011-08-03 LAB — LDL CHOLESTEROL, DIRECT: Direct LDL: 148.6 mg/dL

## 2011-08-03 LAB — LIPID PANEL
Cholesterol: 249 mg/dL — ABNORMAL HIGH (ref 0–200)
HDL: 40.6 mg/dL (ref >39.00)
Total CHOL/HDL Ratio: 6
Triglycerides: 311 mg/dL — ABNORMAL HIGH (ref 0.0–149.0)

## 2011-08-03 MED ORDER — TESTOSTERONE 30 MG/ACT TD SOLN: 2.0000 | Freq: Every day | TRANSDERMAL | Status: DC

## 2011-08-03 NOTE — Assessment & Plan Note (Signed)
He has improved on viibryd so will continue it for now

## 2011-08-03 NOTE — Assessment & Plan Note (Signed)
He was given an Rx of axiron, I think this will help with his mood disorder, weight gain, and fatigue

## 2011-08-03 NOTE — Assessment & Plan Note (Signed)
He has started lifestyle modifications, I will recheck his FLP today

## 2011-08-03 NOTE — Patient Instructions (Signed)
Hypertriglyceridemia  Diet for High blood levels of Triglycerides Most fats in food are triglycerides. Triglycerides in your blood are stored as fat in your body. High levels of triglycerides in your blood may put you at a greater risk for heart disease and stroke.  Normal triglyceride levels are less than 150 mg/dL. Borderline high levels are 150-199 mg/dl. High levels are 200 - 499 mg/dL, and very high triglyceride levels are greater than 500 mg/dL. The decision to treat high triglycerides is generally based on the level. For people with borderline or high triglyceride levels, treatment includes weight loss and exercise. Drugs are recommended for people with very high triglyceride levels. Many people who need treatment for high triglyceride levels have metabolic syndrome. This syndrome is a collection of disorders that often include: insulin resistance, high blood pressure, blood clotting problems, high cholesterol and triglycerides. TESTING PROCEDURE FOR TRIGLYCERIDES  You should not eat 4 hours before getting your triglycerides measured. The normal range of triglycerides is between 10 and 250 milligrams per deciliter (mg/dl). Some people may have extreme levels (1000 or above), but your triglyceride level may be too high if it is above 150 mg/dl, depending on what other risk factors you have for heart disease.   People with high blood triglycerides may also have high blood cholesterol levels. If you have high blood cholesterol as well as high blood triglycerides, your risk for heart disease is probably greater than if you only had high triglycerides. High blood cholesterol is one of the main risk factors for heart disease.  CHANGING YOUR DIET  Your weight can affect your blood triglyceride level. If you are more than 20% above your ideal body weight, you may be able to lower your blood triglycerides by losing weight. Eating less and exercising regularly is the best way to combat this. Fat provides  more calories than any other food. The best way to lose weight is to eat less fat. Only 30% of your total calories should come from fat. Less than 7% of your diet should come from saturated fat. A diet low in fat and saturated fat is the same as a diet to decrease blood cholesterol. By eating a diet lower in fat, you may lose weight, lower your blood cholesterol, and lower your blood triglyceride level.  Eating a diet low in fat, especially saturated fat, may also help you lower your blood triglyceride level. Ask your dietitian to help you figure how much fat you can eat based on the number of calories your caregiver has prescribed for you.  Exercise, in addition to helping with weight loss may also help lower triglyceride levels.   Alcohol can increase blood triglycerides. You may need to stop drinking alcoholic beverages.   Too much carbohydrate in your diet may also increase your blood triglycerides. Some complex carbohydrates are necessary in your diet. These may include bread, rice, potatoes, other starchy vegetables and cereals.   Reduce "simple" carbohydrates. These may include pure sugars, candy, honey, and jelly without losing other nutrients. If you have the kind of high blood triglycerides that is affected by the amount of carbohydrates in your diet, you will need to eat less sugar and less high-sugar foods. Your caregiver can help you with this.   Adding 2-4 grams of fish oil (EPA+ DHA) may also help lower triglycerides. Speak with your caregiver before adding any supplements to your regimen.  Following the Diet  Maintain your ideal weight. Your caregivers can help you with a diet. Generally,   eating less food and getting more exercise will help you lose weight. Joining a weight control group may also help. Ask your caregivers for a good weight control group in your area.  Eat low-fat foods instead of high-fat foods. This can help you lose weight too.  These foods are lower in fat. Eat MORE  of these:   Dried beans, peas, and lentils.   Egg whites.   Low-fat cottage cheese.   Fish.   Lean cuts of meat, such as round, sirloin, rump, and flank (cut extra fat off meat you fix).   Whole grain breads, cereals and pasta.   Skim and nonfat dry milk.   Low-fat yogurt.   Poultry without the skin.   Cheese made with skim or part-skim milk, such as mozzarella, parmesan, farmers', ricotta, or pot cheese.  These are higher fat foods. Eat LESS of these:   Whole milk and foods made from whole milk, such as American, blue, cheddar, monterey jack, and swiss cheese   High-fat meats, such as luncheon meats, sausages, knockwurst, bratwurst, hot dogs, ribs, corned beef, ground pork, and regular ground beef.   Fried foods.  Limit saturated fats in your diet. Substituting unsaturated fat for saturated fat may decrease your blood triglyceride level. You will need to read package labels to know which products contain saturated fats.  These foods are high in saturated fat. Eat LESS of these:   Fried pork skins.   Whole milk.   Skin and fat from poultry.   Palm oil.   Butter.   Shortening.   Cream cheese.   Bacon.   Margarines and baked goods made from listed oils.   Vegetable shortenings.   Chitterlings.   Fat from meats.   Coconut oil.   Palm kernel oil.   Lard.   Cream.   Sour cream.   Fatback.   Coffee whiteners and non-dairy creamers made with these oils.   Cheese made from whole milk.  Use unsaturated fats (both polyunsaturated and monounsaturated) moderately. Remember, even though unsaturated fats are better than saturated fats; you still want a diet low in total fat.  These foods are high in unsaturated fat:   Canola oil.   Sunflower oil.   Mayonnaise.   Almonds.   Peanuts.   Pine nuts.   Margarines made with these oils.   Safflower oil.   Olive oil.   Avocados.   Cashews.   Peanut butter.   Sunflower seeds.   Soybean oil.     Peanut oil.   Olives.   Pecans.   Walnuts.   Pumpkin seeds.  Avoid sugar and other high-sugar foods. This will decrease carbohydrates without decreasing other nutrients. Sugar in your food goes rapidly to your blood. When there is excess sugar in your blood, your liver may use it to make more triglycerides. Sugar also contains calories without other important nutrients.  Eat LESS of these:   Sugar, brown sugar, powdered sugar, jam, jelly, preserves, honey, syrup, molasses, pies, candy, cakes, cookies, frosting, pastries, colas, soft drinks, punches, fruit drinks, and regular gelatin.   Avoid alcohol. Alcohol, even more than sugar, may increase blood triglycerides. In addition, alcohol is high in calories and low in nutrients. Ask for sparkling water, or a diet soft drink instead of an alcoholic beverage.  Suggestions for planning and preparing meals   Bake, broil, grill or roast meats instead of frying.   Remove fat from meats and skin from poultry before cooking.   Add spices,   herbs, lemon juice or vinegar to vegetables instead of salt, rich sauces or gravies.   Use a non-stick skillet without fat or use no-stick sprays.   Cool and refrigerate stews and broth. Then remove the hardened fat floating on the surface before serving.   Refrigerate meat drippings and skim off fat to make low-fat gravies.   Serve more fish.   Use less butter, margarine and other high-fat spreads on bread or vegetables.   Use skim or reconstituted non-fat dry milk for cooking.   Cook with low-fat cheeses.   Substitute low-fat yogurt or cottage cheese for all or part of the sour cream in recipes for sauces, dips or congealed salads.   Use half yogurt/half mayonnaise in salad recipes.   Substitute evaporated skim milk for cream. Evaporated skim milk or reconstituted non-fat dry milk can be whipped and substituted for whipped cream in certain recipes.   Choose fresh fruits for dessert instead of  high-fat foods such as pies or cakes. Fruits are naturally low in fat.  When Dining Out   Order low-fat appetizers such as fruit or vegetable juice, pasta with vegetables or tomato sauce.   Select clear, rather than cream soups.   Ask that dressings and gravies be served on the side. Then use less of them.   Order foods that are baked, broiled, poached, steamed, stir-fried, or roasted.   Ask for margarine instead of butter, and use only a small amount.   Drink sparkling water, unsweetened tea or coffee, or diet soft drinks instead of alcohol or other sweet beverages.  QUESTIONS AND ANSWERS ABOUT OTHER FATS IN THE BLOOD: SATURATED FAT, TRANS FAT, AND CHOLESTEROL What is trans fat? Trans fat is a type of fat that is formed when vegetable oil is hardened through a process called hydrogenation. This process helps makes foods more solid, gives them shape, and prolongs their shelf life. Trans fats are also called hydrogenated or partially hydrogenated oils.  What do saturated fat, trans fat, and cholesterol in foods have to do with heart disease? Saturated fat, trans fat, and cholesterol in the diet all raise the level of LDL "bad" cholesterol in the blood. The higher the LDL cholesterol, the greater the risk for coronary heart disease (CHD). Saturated fat and trans fat raise LDL similarly.  What foods contain saturated fat, trans fat, and cholesterol? High amounts of saturated fat are found in animal products, such as fatty cuts of meat, chicken skin, and full-fat dairy products like butter, whole milk, cream, and cheese, and in tropical vegetable oils such as palm, palm kernel, and coconut oil. Trans fat is found in some of the same foods as saturated fat, such as vegetable shortening, some margarines (especially hard or stick margarine), crackers, cookies, baked goods, fried foods, salad dressings, and other processed foods made with partially hydrogenated vegetable oils. Small amounts of trans fat  also occur naturally in some animal products, such as milk products, beef, and lamb. Foods high in cholesterol include liver, other organ meats, egg yolks, shrimp, and full-fat dairy products. How can I use the new food label to make heart-healthy food choices? Check the Nutrition Facts panel of the food label. Choose foods lower in saturated fat, trans fat, and cholesterol. For saturated fat and cholesterol, you can also use the Percent Daily Value (%DV): 5% DV or less is low, and 20% DV or more is high. (There is no %DV for trans fat.) Use the Nutrition Facts panel to choose foods low in   saturated fat and cholesterol, and if the trans fat is not listed, read the ingredients and limit products that list shortening or hydrogenated or partially hydrogenated vegetable oil, which tend to be high in trans fat. POINTS TO REMEMBER: YOU NEED A LITTLE TLC (THERAPEUTIC LIFESTYLE CHANGES)  Discuss your risk for heart disease with your caregivers, and take steps to reduce risk factors.   Change your diet. Choose foods that are low in saturated fat, trans fat, and cholesterol.   Add exercise to your daily routine if it is not already being done. Participate in physical activity of moderate intensity, like brisk walking, for at least 30 minutes on most, and preferably all days of the week. No time? Break the 30 minutes into three, 10-minute segments during the day.   Stop smoking. If you do smoke, contact your caregiver to discuss ways in which they can help you quit.   Do not use street drugs.   Maintain a normal weight.   Maintain a healthy blood pressure.   Keep up with your blood work for checking the fats in your blood as directed by your caregiver.  Document Released: 03/15/2004 Document Revised: 02/07/2011 Document Reviewed: 10/11/2008 [employer] Patient Information 2012 [employer]. 

## 2011-08-03 NOTE — Assessment & Plan Note (Signed)
I think this is caused by the low T

## 2011-08-03 NOTE — Assessment & Plan Note (Signed)
His BP is well controlled 

## 2011-08-03 NOTE — Progress Notes (Signed)
Subjective:    Patient ID: Frederick Snyder, male    DOB: 1986-03-31, 26 y.o.   MRN: [id number]  Hyperlipidemia This is a recurrent problem. The current episode started more than 1 year ago. The problem is uncontrolled. Recent lipid tests were reviewed and are variable. Exacerbating diseases include obesity. He has no history of chronic renal disease, diabetes, hypothyroidism, liver disease or nephrotic syndrome. Factors aggravating his hyperlipidemia include fatty foods. Pertinent negatives include no chest pain, focal sensory loss, focal weakness, leg pain, myalgias or shortness of breath. He is currently on no antihyperlipidemic treatment. The current treatment provides no improvement of lipids. Compliance problems include adherence to exercise and adherence to diet.       Review of Systems  Constitutional: Positive for fatigue and unexpected weight change (weight gain). Negative for fever, chills, diaphoresis, activity change and appetite change.  HENT: Negative.   Eyes: Negative.   Respiratory: Negative for cough, chest tightness, shortness of breath, wheezing and stridor.   Cardiovascular: Negative for chest pain, palpitations and leg swelling.  Gastrointestinal: Negative for nausea, vomiting, abdominal pain, diarrhea, constipation, blood in stool, abdominal distention, anal bleeding and rectal pain.  Genitourinary: Negative.   Musculoskeletal: Negative for myalgias, back pain, joint swelling, arthralgias and gait problem.  Skin: Negative.   Neurological: Negative.  Negative for focal weakness.  Hematological: Negative.   Psychiatric/Behavioral: Negative for suicidal ideas, hallucinations, behavioral problems, confusion, sleep disturbance, self-injury, dysphoric mood, decreased concentration and agitation. The patient is not nervous/anxious and is not hyperactive.        Objective:   Physical Exam  Vitals reviewed. Constitutional: He is oriented to person, place, and time. He  appears well-developed and well-nourished. No distress.  HENT:  Head: Normocephalic and atraumatic.  Mouth/Throat: Oropharynx is clear and moist. No oropharyngeal exudate.  Eyes: Conjunctivae are normal. Right eye exhibits no discharge. Left eye exhibits no discharge. No scleral icterus.  Neck: Normal range of motion. Neck supple. No JVD present. No tracheal deviation present. No thyromegaly present.  Cardiovascular: Normal rate, regular rhythm, normal heart sounds and intact distal pulses.  Exam reveals no gallop and no friction rub.   No murmur heard. Pulmonary/Chest: Effort normal and breath sounds normal. No stridor. No respiratory distress. He has no wheezes. He has no rales. He exhibits no tenderness.  Abdominal: Soft. Bowel sounds are normal. He exhibits no distension and no mass. There is no tenderness. There is no rebound and no guarding.  Musculoskeletal: Normal range of motion. He exhibits no edema and no tenderness.  Lymphadenopathy:    He has no cervical adenopathy.  Neurological: He is oriented to person, place, and time.  Skin: Skin is warm and dry. No rash noted. He is not diaphoretic. No erythema. No pallor.  Psychiatric: He has a normal mood and affect. His behavior is normal. Judgment and thought content normal.      Lab Results  Component Value Date   WBC 5.8 07/05/2011   HGB 14.8 07/05/2011   HCT 42.6 07/05/2011   PLT 232.0 07/05/2011   GLUCOSE 97 07/05/2011   CHOL 243* 07/05/2011   TRIG 353.0* 07/05/2011   HDL 34.80* 07/05/2011   LDLDIRECT 141.7 07/05/2011   ALT 22 07/05/2011   AST 19 07/05/2011   NA 137 07/05/2011   K 4.1 07/05/2011   CL 105 07/05/2011   CREATININE 0.8 07/05/2011   BUN 17 07/05/2011   CO2 26 07/05/2011   TSH 0.84 07/05/2011   HGBA1C 5.5 02/06/2011  Assessment & Plan:

## 2016-07-31 ENCOUNTER — Encounter (HOSPITAL_COMMUNITY): Payer: Self-pay | Admitting: Emergency Medicine

## 2016-07-31 ENCOUNTER — Emergency Department (HOSPITAL_COMMUNITY)
Admission: EM | Admit: 2016-07-31 | Discharge: 2016-07-31 | Disposition: A | Discharge status: Home / Self Care | Payer: Managed Care, Other (non HMO) | Attending: Emergency Medicine | Admitting: Emergency Medicine

## 2016-07-31 DIAGNOSIS — M545 Low back pain, unspecified: Secondary | ICD-10-CM

## 2016-07-31 DIAGNOSIS — J45909 Unspecified asthma, uncomplicated: Secondary | ICD-10-CM | POA: Diagnosis not present

## 2016-07-31 DIAGNOSIS — Z79899 Other long term (current) drug therapy: Secondary | ICD-10-CM | POA: Insufficient documentation

## 2016-07-31 DIAGNOSIS — I10 Essential (primary) hypertension: Secondary | ICD-10-CM | POA: Insufficient documentation

## 2016-07-31 MED ORDER — HYDROCODONE-ACETAMINOPHEN 5-325 MG PO TABS
1.0000 | ORAL_TABLET | Freq: Once | ORAL | Status: AC
  Administered 2016-07-31: 1 via ORAL
  Filled 2016-07-31: qty 1

## 2016-07-31 MED ORDER — DIAZEPAM 5 MG PO TABS: 5.0000 mg | ORAL_TABLET | Freq: Two times a day (BID) | ORAL | 0 refills | Status: DC

## 2016-07-31 MED ORDER — HYDROCODONE-ACETAMINOPHEN 5-325 MG PO TABS: 1.0000 | ORAL_TABLET | ORAL | 0 refills | Status: DC | PRN

## 2016-07-31 MED ORDER — DIAZEPAM 5 MG PO TABS
5.0000 mg | ORAL_TABLET | Freq: Once | ORAL | Status: AC
  Administered 2016-07-31: 5 mg via ORAL
  Filled 2016-07-31: qty 1

## 2016-07-31 MED ORDER — KETOROLAC TROMETHAMINE 30 MG/ML IJ SOLN
30.0000 mg | Freq: Once | INTRAMUSCULAR | Status: AC
  Administered 2016-07-31: 30 mg via INTRAMUSCULAR
  Filled 2016-07-31: qty 1

## 2016-07-31 MED ORDER — IBUPROFEN 600 MG PO TABS: 600.0000 mg | ORAL_TABLET | Freq: Four times a day (QID) | ORAL | 0 refills | Status: DC | PRN

## 2016-07-31 NOTE — Discharge Instructions (Signed)
Take your medications as prescribed as needed for pain relief. I also recommended applied ice and/or heat to affected area for 15-20 minutes 3-4 times daily for additional relief. Refrain from doing any heavy lifting, squatting or repetitive movements of exacerbating your symptoms for the next few days. I recommend following up with your primary care provider in the next week if your symptoms have not improved. Please return to the Emergency Department if symptoms worsen or new onset of fever, numbness, tingling, groin anesthesia, loss of bowel or bladder, weakness.

## 2016-07-31 NOTE — ED Notes (Signed)
ED Provider at bedside. 

## 2016-07-31 NOTE — ED Provider Notes (Signed)
[facility] DEPT Provider Note   CSN: [id number] Arrival date & time: 07/31/16  1821     History   Chief Complaint Chief Complaint  Patient presents with  . Back Pain    HPI ODEL SCHMID is a 31 y.o. male.  HPI   Patient is a 31 year old male with history of hyperlipidemia presents the ED with complaint of low back pain, onset 1 hour prior to arrival. Patient reports he was at the store earlier this evening and notes when he was lifting a 30 pound bag of kitty lid or, as he bent and turned to throw the bag on the bottom of the grocery cart he began to have sharp sudden pain in his lower back that radiated up his entire back. Patient reports now having constant sharp tight pain to his left lower back. Pain is worse with movement or ambulation. Denies any medications prior to arrival. Denies prior history of back injuries. Pt denies fever, numbness, tingling, saddle anesthesia, loss of bowel or bladder, weakness, IVDU, cancer or recent spinal manipulation.   Past Medical History:  Diagnosis Date  . Asthma   . Hyperlipidemia   . Migraine     Patient Active Problem List   Diagnosis Date Noted  . Hypogonadism male 08/03/2011  . Fatigue 03/12/2011  . Depression, acute 02/06/2011  . Essential hypertension, benign 02/06/2011  . HYPERLIPIDEMIA 05/25/2009  . OBESITY 05/25/2009  . ASTHMA 05/25/2009    Past Surgical History:  Procedure Laterality Date  . KNEE SURGERY    . tubes in ears         Home Medications    Prior to Admission medications   Medication Sig Start Date End Date Taking? Authorizing Provider  diazepam (VALIUM) 5 MG tablet Take 1 tablet (5 mg total) by mouth 2 (two) times daily. 07/31/16   Nona Dell, PA-C  HYDROcodone-acetaminophen (NORCO/VICODIN) 5-325 MG tablet Take 1 tablet by mouth every 4 (four) hours as needed. 07/31/16   Nona Dell, PA-C  ibuprofen (ADVIL,MOTRIN) 600 MG tablet Take 1 tablet (600 mg total) by mouth  every 6 (six) hours as needed. 07/31/16   Nona Dell, PA-C  olmesartan (BENICAR) 40 MG tablet Take 1 tablet (40 mg total) by mouth daily. 03/12/11 03/11/12  Janith Lima, MD  Testosterone Hinda Kehr) 30 MG/ACT SOLN Place 2 Act onto the skin daily. 08/03/11   Janith Lima, MD  Vilazodone HCl (VIIBRYD) 10 & 20 & 40 MG KIT Take 1 tablet by mouth daily. 07/05/11   Janith Lima, MD    Family History Family History  Problem Relation Age of Onset  . Diabetes Other   . Hyperlipidemia Other   . Hypertension Other     Social History Social History  Substance Use Topics  . Smoking status: Never Smoker  . Smokeless tobacco: Not on file  . Alcohol use No     Allergies   Patient has no known allergies.   Review of Systems Review of Systems  Musculoskeletal: Positive for back pain.  All other systems reviewed and are negative.    Physical Exam Updated Vital Signs BP 148/99 (BP Location: Left Arm)   Pulse 65   Temp 97.7 F (36.5 C) (Oral)   Resp 16   SpO2 100%   Physical Exam  Constitutional: He is oriented to person, place, and time. He appears well-developed and well-nourished. No distress.  HENT:  Head: Normocephalic and atraumatic.  Eyes: Conjunctivae and EOM are normal. Right eye  exhibits no discharge. Left eye exhibits no discharge. No scleral icterus.  Neck: Normal range of motion. Neck supple.  Cardiovascular: Normal rate, regular rhythm, normal heart sounds and intact distal pulses.   Pulmonary/Chest: Effort normal and breath sounds normal. No respiratory distress. He has no wheezes. He has no rales. He exhibits no tenderness.  Abdominal: Soft. Bowel sounds are normal. He exhibits no distension and no mass. There is no tenderness. There is no rebound and no guarding. No hernia.  Musculoskeletal: Normal range of motion. He exhibits tenderness. He exhibits no edema or deformity.  No midline C, T, or L tenderness. Full range of motion of neck and back. Full range  of motion of bilateral upper and lower extremities, with 5/5 strength. Sensation intact. 2+ radial and PT pulses. Cap refill <2 seconds. Patient able to stand and ambulate without assistance but endorses pain.  Neurological: He is alert and oriented to person, place, and time. He has normal strength. He displays normal reflexes. No sensory deficit.  Skin: Skin is warm and dry. He is not diaphoretic.  Nursing note and vitals reviewed.    ED Treatments / Results  Labs (all labs ordered are listed, but only abnormal results are displayed) Labs Reviewed - No data to display  EKG  EKG Interpretation None       Radiology No results found.  Procedures Procedures (including critical care time)  Medications Ordered in ED Medications  HYDROcodone-acetaminophen (NORCO/VICODIN) 5-325 MG per tablet 1 tablet (1 tablet Oral Given 07/31/16 1934)  diazepam (VALIUM) tablet 5 mg (5 mg Oral Given 07/31/16 1934)  ketorolac (TORADOL) 30 MG/ML injection 30 mg (30 mg Intramuscular Given 07/31/16 2114)     Initial Impression / Assessment and Plan / ED Course  I have reviewed the triage vital signs and the nursing notes.  Pertinent labs & imaging results that were available during my care of the patient were reviewed by me and considered in my medical decision making (see chart for details).     Patient with back pain s/p lifting a 30lb bag of kitty litter earlier today.  No neurological deficits and normal neuro exam.  Patient can walk but states is painful.  No loss of bowel or bladder control.  No concern for cauda equina.  No fever, night sweats, weight loss, h/o cancer, IVDU.  RICE protocol and pain medicine indicated and discussed with patient.    Final Clinical Impressions(s) / ED Diagnoses   Final diagnoses:  Acute left-sided low back pain without sciatica    New Prescriptions New Prescriptions   DIAZEPAM (VALIUM) 5 MG TABLET    Take 1 tablet (5 mg total) by mouth 2 (two) times daily.    HYDROCODONE-ACETAMINOPHEN (NORCO/VICODIN) 5-325 MG TABLET    Take 1 tablet by mouth every 4 (four) hours as needed.   IBUPROFEN (ADVIL,MOTRIN) 600 MG TABLET    Take 1 tablet (600 mg total) by mouth every 6 (six) hours as needed.     Chesley Noon [place], [place] 07/31/16 2118    Leo Grosser, MD 08/02/16 [id number]

## 2016-07-31 NOTE — ED Notes (Signed)
Pt reports pain is better when he is not moving and is just sitting down, pain is still worse when attempting to get OOB.  Pt states initially pain in his back was still severe even when he was sitting down.

## 2016-07-31 NOTE — ED Notes (Signed)
Pt ambulated to the BR with steady gait, without assist.

## 2016-07-31 NOTE — ED Notes (Signed)
Pt laying on his L side, appears comfortable talking with family at bedside.

## 2016-07-31 NOTE — ED Triage Notes (Signed)
Patient reports that he was putting bag kitty litter underneath the grocery cart when lower back pain started. Patient very restless in triage chair.

## 2018-02-26 ENCOUNTER — Ambulatory Visit (INDEPENDENT_AMBULATORY_CARE_PROVIDER_SITE_OTHER): Payer: Managed Care, Other (non HMO) | Admitting: Endocrinology

## 2018-02-26 ENCOUNTER — Encounter: Payer: Self-pay | Admitting: Endocrinology

## 2018-02-26 DIAGNOSIS — E221 Hyperprolactinemia: Secondary | ICD-10-CM

## 2018-02-26 MED ORDER — TESTOSTERONE CYPIONATE 200 MG/ML IM SOLN: 50.0000 mg | INTRAMUSCULAR | 0 refills | Status: DC

## 2018-02-26 MED ORDER — BROMOCRIPTINE MESYLATE 2.5 MG PO TABS: 1.2500 mg | ORAL_TABLET | Freq: Every day | ORAL | 11 refills | Status: DC

## 2018-02-26 NOTE — Progress Notes (Signed)
Subjective:    Patient ID: Frederick Snyder, male    DOB: [birth year], 32 y.o.   MRN: [id number]  HPI Pt is referred by Marnette BurgessMonica Bartorelli, NP, for hypogonadism.  Pt reports he had puberty at the normal age.  He has no biological children.  He says he has never taken illicit androgens.  He took testosterone injections for a few months in 2011.  He then took Axiron for a few months.  He has been on testosterone injections since early 2019.  He does not take antiandrogens or opioids.  He denies any h/o infertility, XRT, or genital infection.  He has never had surgery, or a serious injury to the head or genital area. He has no h/o sleep apnea or DVT.  He does not consume alcohol excessively.  He reports moderate fatigue, and assoc weight gain.   He says he not desire fertility.  Since on testosterone injections, fatigue and depression are somewhat improved.   Past Medical History:  Diagnosis Date  . Asthma   . Hyperlipidemia   . Migraine     Past Surgical History:  Procedure Laterality Date  . KNEE SURGERY    . tubes in ears      Social History   Socioeconomic History  . Marital status: Single    Spouse name: Not on file  . Number of children: Not on file  . Years of education: Not on file  . Highest education level: Not on file  Occupational History  . Occupation: Civil engineer, contractingpolice officer    Employer: UNEMPLOYED  Social Needs  . Financial resource strain: Not on file  . Food insecurity:    Worry: Not on file    Inability: Not on file  . Transportation needs:    Medical: Not on file    Non-medical: Not on file  Tobacco Use  . Smoking status: Never Smoker  . Smokeless tobacco: Former Engineer, waterUser  Substance and Sexual Activity  . Alcohol use: No  . Drug use: No  . Sexual activity: Not Currently  Lifestyle  . Physical activity:    Days per week: Not on file    Minutes per session: Not on file  . Stress: Not on file  Relationships  . Social connections:    Talks on phone: Not on file     Gets together: Not on file    Attends religious service: Not on file    Active member of club or organization: Not on file    Attends meetings of clubs or organizations: Not on file    Relationship status: Not on file  . Intimate partner violence:    Fear of current or ex partner: Not on file    Emotionally abused: Not on file    Physically abused: Not on file    Forced sexual activity: Not on file  Other Topics Concern  . Not on file  Social History Narrative  . Not on file    Current Outpatient Medications on File Prior to Visit  Medication Sig Dispense Refill  . amphetamine-dextroamphetamine (ADDERALL) 10 MG tablet daily.  0  . hydrochlorothiazide (HYDRODIURIL) 25 MG tablet daily.  1  . sertraline (ZOLOFT) 25 MG tablet Take 75 mg by mouth daily.  0  . VYVANSE 70 MG capsule Take 70 mg by mouth daily.  0  . diazepam (VALIUM) 5 MG tablet Take 1 tablet (5 mg total) by mouth 2 (two) times daily. (Patient not taking: Reported on 02/26/2018) 10 tablet 0  .  olmesartan (BENICAR) 40 MG tablet Take 1 tablet (40 mg total) by mouth daily. 30 tablet 11   No current facility-administered medications on file prior to visit.     No Known Allergies  Family History  Problem Relation Age of Onset  . Diabetes Other   . Hyperlipidemia Other   . Hypertension Other   . Other Neg Hx        hypogonadism    BP 132/88   Pulse 76   Ht 6' (1.829 m)   Wt 297 lb 12.8 oz (135.1 kg)   SpO2 98%   BMI 40.39 kg/m     Review of Systems denies fever, numbness, decreased urinary stream, gynecomastia, muscle weakness, headache, easy bruising, sob, rash, blurry vision, rhinorrhea, and chest pain.  He has ED sxs.  Depression is well-controlled.       Objective:   Physical Exam VS: see vs page GEN: no distress HEAD: head: no deformity eyes: no periorbital swelling, no proptosis external nose and ears are normal mouth: no lesion seen NECK: supple, thyroid is not enlarged CHEST WALL: no  deformity LUNGS: clear to auscultation BREASTS:  No gynecomastia CV: reg rate and rhythm, no murmur ABD: abdomen is soft, nontender.  no hepatosplenomegaly.  not distended.  no hernia GENITALIA:  Normal male.   MUSCULOSKELETAL: muscle bulk and strength are grossly normal.  no obvious joint swelling.  gait is normal and steady EXTEMITIES: no leg edema PULSES: dorsalis pedis intact bilat.  no carotid bruit NEURO:  cn 2-12 grossly intact.   readily moves all 4's.  sensation is intact to touch on all 4's.  No tremor.  SKIN:  Normal texture and temperature.  No rash or suspicious lesion is visible.  Normal hair distribution NODES:  None palpable at the neck PSYCH: alert, well-oriented.  Does not appear anxious nor depressed.     Lab Results  Component Value Date   WBC 5.8 07/05/2011   HGB 14.8 07/05/2011   HCT 42.6 07/05/2011   MCV 84.8 07/05/2011   PLT 232.0 07/05/2011    outside test results are reviewed: Prolactin=16 TSH=1.9 Cortisol=16  I have reviewed outside records, and summarized: Pt was noted to have elevated prolactin, and referred here.  Other probs addressed were obesity, anxiety, and HTN.       Assessment & Plan:  Hypogonadism, new to me: uncertain etiology.   Hyperprolactinemia, mild.  Still, rx of this would be first line rx  Patient Instructions  Testosterone treatment has risks, including increased or decreased fertility (depending on the type of treatment), hair loss, prostate cancer, benign prostate enlargement, blood clots, liver problems, lower hdl ("good cholesterol"), polycythemia (opposite of anemia), sleep apnea, and behavior changes. Weight loss helps the testosterone, also.   I have sent a prescription to your pharmacy, to suppress the prolactin. Also, please reduce the testosterone to 50 mg every week. Please come back to the lab to do blood tests in approx 2 months.   Our goals will be normal levels and not to have the injections.

## 2018-02-26 NOTE — Patient Instructions (Addendum)
Testosterone treatment has risks, including increased or decreased fertility (depending on the type of treatment), hair loss, prostate cancer, benign prostate enlargement, blood clots, liver problems, lower hdl ("good cholesterol"), polycythemia (opposite of anemia), sleep apnea, and behavior changes. Weight loss helps the testosterone, also.   I have sent a prescription to your pharmacy, to suppress the prolactin. Also, please reduce the testosterone to 50 mg every week. Please come back to the lab to do blood tests in approx 2 months.   Our goals will be normal levels and not to have the injections.

## 2018-04-06 ENCOUNTER — Encounter: Payer: Self-pay | Admitting: Emergency Medicine

## 2018-04-06 DIAGNOSIS — F411 Generalized anxiety disorder: Secondary | ICD-10-CM | POA: Insufficient documentation

## 2018-04-06 DIAGNOSIS — F429 Obsessive-compulsive disorder, unspecified: Secondary | ICD-10-CM

## 2018-04-06 DIAGNOSIS — F909 Attention-deficit hyperactivity disorder, unspecified type: Secondary | ICD-10-CM | POA: Insufficient documentation

## 2018-04-14 ENCOUNTER — Ambulatory Visit (INDEPENDENT_AMBULATORY_CARE_PROVIDER_SITE_OTHER): Payer: 59 | Admitting: Psychiatry

## 2018-04-14 DIAGNOSIS — F429 Obsessive-compulsive disorder, unspecified: Secondary | ICD-10-CM

## 2018-04-14 DIAGNOSIS — F909 Attention-deficit hyperactivity disorder, unspecified type: Secondary | ICD-10-CM

## 2018-04-14 DIAGNOSIS — F411 Generalized anxiety disorder: Secondary | ICD-10-CM

## 2018-04-14 MED ORDER — AMPHETAMINE-DEXTROAMPHETAMINE 10 MG PO TABS: 10.0000 mg | ORAL_TABLET | Freq: Every day | ORAL | 0 refills | Status: DC

## 2018-04-14 MED ORDER — SERTRALINE HCL 25 MG PO TABS: 75.0000 mg | ORAL_TABLET | Freq: Every day | ORAL | 4 refills | Status: DC

## 2018-04-14 MED ORDER — VYVANSE 70 MG PO CAPS: 70.0000 mg | ORAL_CAPSULE | Freq: Every day | ORAL | 0 refills | Status: DC

## 2018-04-14 NOTE — Progress Notes (Signed)
[facility]  Patient ID: Frederick Snyder,  MRN: [id number]  PCP: Hal Morales, NP  Date of Evaluation: 04/14/2018 Time spent:20 minutes  Chief Complaint:   HISTORY/CURRENT STATUS: HPI 32 year old white male last seen 01/20/2018.  Was doing well at the time so we continue current medications. Currently patient is doing well.  He checks his blood pressure and pulse at home every so often.  Most recent was 2 weeks ago it is always 140/90 or less  pulse less than 100  Individual Medical History/ Review of Systems: Changes? :No   Allergies: Patient has no known allergies.  Current Medications:  Current Outpatient Medications:  .  [START ON 08/04/2018] amphetamine-dextroamphetamine (ADDERALL) 10 MG tablet, Take 1 tablet (10 mg total) by mouth daily., Disp: 30 tablet, Rfl: 0 .  bromocriptine (PARLODEL) 2.5 MG tablet, Take 0.5 tablets (1.25 mg total) by mouth at bedtime., Disp: 15 tablet, Rfl: 11 .  hydrochlorothiazide (HYDRODIURIL) 25 MG tablet, daily., Disp: , Rfl: 1 .  sertraline (ZOLOFT) 25 MG tablet, Take 3 tablets (75 mg total) by mouth daily., Disp: 90 tablet, Rfl: 4 .  testosterone cypionate (DEPOTESTOSTERONE CYPIONATE) 200 MG/ML injection, Inject 0.25 mLs (50 mg total) into the muscle once a week., Disp: 10 mL, Rfl: 0 .  [START ON 08/04/2018] VYVANSE 70 MG capsule, Take 1 capsule (70 mg total) by mouth daily., Disp: 30 capsule, Rfl: 0 .  diazepam (VALIUM) 5 MG tablet, Take 1 tablet (5 mg total) by mouth 2 (two) times daily. (Patient not taking: Reported on 02/26/2018), Disp: 10 tablet, Rfl: 0 .  olmesartan (BENICAR) 40 MG tablet, Take 1 tablet (40 mg total) by mouth daily., Disp: 30 tablet, Rfl: 11 Medication Side Effects: none  Family Medical/ Social History: Changes? No  MENTAL HEALTH EXAM:  There were no vitals taken for this visit.There is no height or weight on file to calculate BMI.  General Appearance: Obese  Eye Contact:  Good  Speech:  Normal Rate   Volume:  Normal  Mood:  Euthymic  Affect:  Appropriate  Thought Process:  Linear  Orientation:  Full (Time, Place, and Person)  Thought Content: Logical   Suicidal Thoughts:  No  Homicidal Thoughts:  No  Memory:  WNL  Judgement:  Good  Insight:  Good  Psychomotor Activity:  Normal  Concentration:  Concentration: Good  Recall:  Good  Fund of Knowledge: Good  Language: Good  Assets:  Desire for Improvement  ADL's:  Intact  Cognition: WNL  Prognosis:  Good    DIAGNOSES:    ICD-10-CM   1. Attention deficit hyperactivity disorder (ADHD), unspecified ADHD type F90.9   2. Obsessive-compulsive disorder, unspecified type F42.9   3. Anxiety state F41.1     Receiving Psychotherapy: No    RECOMMENDATIONS: Patient will continue the Vyvanse 70 mg a day and the Adderall 10 mg a day.  We will also can continue the Zoloft 75 mg a day.  Family physician follows his blood pressure.  We will see him again in 4 months.   Anne Fu, PA-C

## 2018-04-21 ENCOUNTER — Encounter: Payer: Self-pay | Admitting: Endocrinology

## 2018-04-21 NOTE — Telephone Encounter (Signed)
Please advise 

## 2018-04-28 ENCOUNTER — Ambulatory Visit: Payer: Managed Care, Other (non HMO) | Admitting: Endocrinology

## 2018-04-29 ENCOUNTER — Other Ambulatory Visit (INDEPENDENT_AMBULATORY_CARE_PROVIDER_SITE_OTHER): Payer: Managed Care, Other (non HMO)

## 2018-04-29 DIAGNOSIS — E221 Hyperprolactinemia: Secondary | ICD-10-CM | POA: Diagnosis not present

## 2018-04-29 NOTE — Addendum Note (Signed)
Addended by: Adline MangoSTONE-ELMORE, Wynell Halberg I on: 04/29/2018 04:31 PM   Modules accepted: Orders

## 2018-04-30 LAB — TESTOSTERONE,FREE AND TOTAL
TESTOSTERONE FREE: 29.2 pg/mL — AB (ref 8.7–25.1)
Testosterone: 694 ng/dL (ref 264–916)

## 2018-04-30 LAB — PROLACTIN: PROLACTIN: 12.7 ng/mL (ref 2.0–18.0)

## 2018-05-02 ENCOUNTER — Ambulatory Visit: Payer: Managed Care, Other (non HMO) | Admitting: Endocrinology

## 2018-05-21 ENCOUNTER — Telehealth: Payer: Self-pay | Admitting: Psychiatry

## 2018-05-21 NOTE — Telephone Encounter (Signed)
Pt. Called and said that he does not have any scripts of the vyvanse and adderrall. The last ones he had were in October You escribed some for him with a start date in February. Please escribe some for December and january

## 2018-05-22 ENCOUNTER — Other Ambulatory Visit: Payer: Self-pay | Admitting: Psychiatry

## 2018-05-22 MED ORDER — AMPHETAMINE-DEXTROAMPHETAMINE 10 MG PO TABS: 10.0000 mg | ORAL_TABLET | Freq: Every day | ORAL | 0 refills | Status: DC

## 2018-05-22 MED ORDER — LISDEXAMFETAMINE DIMESYLATE 70 MG PO CAPS: 70.0000 mg | ORAL_CAPSULE | Freq: Every day | ORAL | 0 refills | Status: DC

## 2018-05-22 NOTE — Telephone Encounter (Signed)
rx's faxed to [facility]

## 2018-07-03 ENCOUNTER — Encounter: Payer: Self-pay | Admitting: Endocrinology

## 2018-07-03 NOTE — Telephone Encounter (Signed)
Please advise 

## 2018-08-18 ENCOUNTER — Ambulatory Visit: Payer: 59 | Admitting: Psychiatry

## 2018-09-03 ENCOUNTER — Other Ambulatory Visit: Payer: Self-pay | Admitting: Psychiatry

## 2018-09-03 MED ORDER — VYVANSE 70 MG PO CAPS: 70.0000 mg | ORAL_CAPSULE | Freq: Every day | ORAL | 0 refills | Status: DC

## 2018-09-03 MED ORDER — AMPHETAMINE-DEXTROAMPHETAMINE 10 MG PO TABS: 10.0000 mg | ORAL_TABLET | Freq: Every day | ORAL | 0 refills | Status: DC

## 2018-10-08 ENCOUNTER — Other Ambulatory Visit: Payer: Self-pay

## 2018-10-08 NOTE — Telephone Encounter (Signed)
Sarah, please call him and make appt.  Then I'll send this in.  Let him know he must keep appt or this will be his last RF

## 2018-10-10 MED ORDER — AMPHETAMINE-DEXTROAMPHETAMINE 10 MG PO TABS: 10.0000 mg | ORAL_TABLET | Freq: Every day | ORAL | 0 refills | Status: DC

## 2018-10-10 MED ORDER — LISDEXAMFETAMINE DIMESYLATE 70 MG PO CAPS: 70.0000 mg | ORAL_CAPSULE | Freq: Every day | ORAL | 0 refills | Status: DC

## 2018-10-10 NOTE — Telephone Encounter (Signed)
He made an appointment for 5/12

## 2018-10-10 NOTE — Telephone Encounter (Signed)
Frederick Snyder did he make an appt?

## 2018-10-21 ENCOUNTER — Other Ambulatory Visit: Payer: Self-pay

## 2018-10-21 ENCOUNTER — Encounter: Payer: Self-pay | Admitting: Physician Assistant

## 2018-10-21 ENCOUNTER — Ambulatory Visit (INDEPENDENT_AMBULATORY_CARE_PROVIDER_SITE_OTHER): Payer: Managed Care, Other (non HMO) | Admitting: Physician Assistant

## 2018-10-21 DIAGNOSIS — F909 Attention-deficit hyperactivity disorder, unspecified type: Secondary | ICD-10-CM

## 2018-10-21 DIAGNOSIS — F429 Obsessive-compulsive disorder, unspecified: Secondary | ICD-10-CM

## 2018-10-21 MED ORDER — AMPHETAMINE-DEXTROAMPHETAMINE 10 MG PO TABS: 10.0000 mg | ORAL_TABLET | Freq: Every day | ORAL | 0 refills | Status: DC

## 2018-10-21 MED ORDER — LISDEXAMFETAMINE DIMESYLATE 70 MG PO CAPS: 70.0000 mg | ORAL_CAPSULE | Freq: Every day | ORAL | 0 refills | Status: DC

## 2018-10-21 MED ORDER — SERTRALINE HCL 25 MG PO TABS: 75.0000 mg | ORAL_TABLET | Freq: Every day | ORAL | 5 refills | Status: DC

## 2018-10-21 MED ORDER — VYVANSE 70 MG PO CAPS: 70.0000 mg | ORAL_CAPSULE | Freq: Every day | ORAL | 0 refills | Status: DC

## 2018-10-21 NOTE — Progress Notes (Signed)
[facility]  Patient ID: Frederick Snyder,  MRN: [id number]  PCP: Hal MoralesGunter, Tara G, NP  Date of Evaluation: 10/21/2018 Time spent:15 minutes  Chief Complaint:  Chief Complaint    Follow-up     Virtual Visit via Telephone Note  I connected with patient by a video enabled telemedicine application or telephone, with their informed consent, and verified patient privacy and that I am speaking with the correct person using two identifiers.  I am private, in my home and the patient is home.   I discussed the limitations, risks, security and privacy concerns of performing an evaluation and management service by telephone and the availability of in person appointments. I also discussed with the patient that there may be a patient responsible charge related to this service. The patient expressed understanding and agreed to proceed.   I discussed the assessment and treatment plan with the patient. The patient was provided an opportunity to ask questions and all were answered. The patient agreed with the plan and demonstrated an understanding of the instructions.   The patient was advised to call back or seek an in-person evaluation if the symptoms worsen or if the condition fails to improve as anticipated.  I provided 15 minutes of non-face-to-face time during this encounter.  HISTORY/CURRENT STATUS: HPI for 1784-month med check.  Patient states he is doing very well as far as the medications go.  He continues to take Vyvanse 70 mg every morning and Adderall in the afternoon and states his focus and concentration are good.  He denies easy distractibility. The Zoloft is also very helpful for OCD.  "I do not have any complaints.  I need to have my medicines refilled."  Patient denies loss of interest in usual activities and is able to enjoy things.  Denies decreased energy or motivation.  Appetite has not changed.  No extreme sadness, tearfulness, or feelings of hopelessness.  Denies  any changes in concentration, making decisions or remembering things.  Denies suicidal or homicidal thoughts.  States he is a little anxious.  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  States his supervisor did not like him for some reason.  She wrote him up several times for having wrinkles in his uniform.  Reports that he changed to a completely different uniform after ironing thoroughly and he was still let go.  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  Boat it is a little scary but exciting to."  Denies dizziness, syncope, seizures, numbness, tingling, tremor, tics, unsteady gait, slurred speech, confusion. Denies muscle or joint pain, stiffness, or dystonia.  Individual Medical History/ Review of Systems: Changes? :No    Past medications for mental health diagnoses include: unknown  Allergies: Patient has no known allergies.  Current Medications:  Current Outpatient Medications:  .  [START ON 12/08/2018] amphetamine-dextroamphetamine (ADDERALL) 10 MG tablet, Take 1 tablet (10 mg total) by mouth daily with lunch., Disp: 30 tablet, Rfl: 0 .  bromocriptine (PARLODEL) 2.5 MG tablet, Take 0.5 tablets (1.25 mg total) by mouth at bedtime., Disp: 15 tablet, Rfl: 11 .  hydrochlorothiazide (HYDRODIURIL) 25 MG tablet, daily., Disp: , Rfl: 1 .  [START ON 01/06/2019] lisdexamfetamine (VYVANSE) 70 MG capsule, Take 1 capsule (70 mg total) by mouth daily., Disp: 30 capsule, Rfl: 0 .  [START ON 11/08/2018] lisdexamfetamine (VYVANSE) 70 MG capsule, Take 1 capsule (70 mg total) by mouth daily., Disp: 30 capsule, Rfl: 0 .  sertraline (ZOLOFT) 25 MG tablet, Take 3  tablets (75 mg total) by mouth daily., Disp: 90 tablet, Rfl: 5 .  [START ON 12/08/2018] VYVANSE 70 MG capsule, Take 1 capsule (70 mg total) by mouth daily., Disp: 30 capsule, Rfl: 0 .  [START ON 11/08/2018] amphetamine-dextroamphetamine (ADDERALL) 10 MG tablet, Take 1 tablet (10 mg total) by mouth daily with lunch., Disp: 30  tablet, Rfl: 0 .  [START ON 01/06/2019] amphetamine-dextroamphetamine (ADDERALL) 10 MG tablet, Take 1 tablet (10 mg total) by mouth daily with lunch., Disp: 30 tablet, Rfl: 0 .  diazepam (VALIUM) 5 MG tablet, Take 1 tablet (5 mg total) by mouth 2 (two) times daily. (Patient not taking: Reported on 02/26/2018), Disp: 10 tablet, Rfl: 0 .  olmesartan (BENICAR) 40 MG tablet, Take 1 tablet (40 mg total) by mouth daily., Disp: 30 tablet, Rfl: 11 .  testosterone cypionate (DEPOTESTOSTERONE CYPIONATE) 200 MG/ML injection, Inject 0.25 mLs (50 mg total) into the muscle once a week. (Patient not taking: Reported on 10/21/2018), Disp: 10 mL, Rfl: 0 Medication Side Effects: none  Family Medical/ Social History: Changes? 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  MENTAL HEALTH EXAM:   There were no vitals taken for this visit.There is no height or weight on file to calculate BMI.  General Appearance: unable to assess  Eye Contact:  unable to assess  Speech:  Clear and Coherent  Volume:  Normal  Mood:  Euthymic  Affect:  Unable to assess  Thought Process:  Goal Directed  Orientation:  Full (Time, Place, and Person)  Thought Content: Logical   Suicidal Thoughts:  No  Homicidal Thoughts:  No  Memory:  WNL  Judgement:  Good  Insight:  Good  Psychomotor Activity:  Unable to assess  Concentration:  Concentration: Good and Attention Span: Good  Recall:  Good  Fund of Knowledge: Good  Language: Good  Assets:  Desire for Improvement  ADL's:  Intact  Cognition: WNL  Prognosis:  Good    DIAGNOSES:    ICD-10-CM   1. Attention deficit hyperactivity disorder (ADHD), unspecified ADHD type F90.9   2. Obsessive-compulsive disorder, unspecified type F42.9     Receiving Psychotherapy: No    RECOMMENDATIONS:  Continue Vyvanse 70 mg p.o. every morning. Continue Adderall 10 mg q. afternoon. Continue Zoloft 75 mg daily. Return in 6  months.  Melony Overly, PA-C   This record has been created using [employer].  Chart creation errors have been sought, but may not always have been located and corrected. Such creation errors do not reflect on the standard of medical care.

## 2019-02-05 ENCOUNTER — Other Ambulatory Visit: Payer: Self-pay | Admitting: Physician Assistant

## 2019-02-06 MED ORDER — AMPHETAMINE-DEXTROAMPHETAMINE 10 MG PO TABS: 10.0000 mg | ORAL_TABLET | Freq: Every day | ORAL | 0 refills | Status: DC

## 2019-04-23 ENCOUNTER — Ambulatory Visit: Payer: Self-pay | Admitting: Physician Assistant

## 2019-05-15 ENCOUNTER — Other Ambulatory Visit: Payer: Self-pay | Admitting: Physician Assistant

## 2019-05-17 NOTE — Telephone Encounter (Signed)
Last apt May, nothing scheduled

## 2019-07-25 ENCOUNTER — Other Ambulatory Visit: Payer: Self-pay | Admitting: Physician Assistant

## 2019-07-26 NOTE — Telephone Encounter (Signed)
Last apt 10/21/2018 due back 6 months, no apt scheduled

## 2019-08-06 ENCOUNTER — Other Ambulatory Visit: Payer: Self-pay | Admitting: Physician Assistant

## 2019-09-07 ENCOUNTER — Other Ambulatory Visit: Payer: Self-pay | Admitting: Physician Assistant

## 2019-09-07 NOTE — Telephone Encounter (Signed)
Last refill 02/15  Last apt 10/2018 due back Nov.

## 2019-10-23 ENCOUNTER — Other Ambulatory Visit: Payer: Self-pay | Admitting: Physician Assistant

## 2019-10-23 NOTE — Telephone Encounter (Signed)
Patient needs to schedule apt, last visit 10/2018

## 2020-04-27 ENCOUNTER — Other Ambulatory Visit: Payer: Self-pay | Admitting: Physician Assistant

## 2020-04-27 ENCOUNTER — Telehealth: Payer: Self-pay | Admitting: *Deleted

## 2020-04-27 NOTE — Telephone Encounter (Signed)
Spoke with patient who is requesting appointment in cardiology for chest pain/tightness/sob with activities.  Resolve with activities. Symptoms have been going on for at least several months.   He has no recent visits in primary care or urgent care.  Is trying to get in with primary care but has not gotten an appointment yet.  After reviewing with Dr. Clifton James I have scheduled him as a new patient for 05/09/20.

## 2020-05-09 ENCOUNTER — Other Ambulatory Visit: Payer: Self-pay

## 2020-05-09 ENCOUNTER — Ambulatory Visit: Payer: Managed Care, Other (non HMO) | Admitting: Cardiovascular Disease

## 2020-05-09 ENCOUNTER — Encounter: Payer: Self-pay | Admitting: Cardiovascular Disease

## 2020-05-09 VITALS — BP 134/104 | HR 80 | Ht 72.0 in | Wt 389.4 lb

## 2020-05-09 DIAGNOSIS — R079 Chest pain, unspecified: Secondary | ICD-10-CM | POA: Diagnosis not present

## 2020-05-09 DIAGNOSIS — R072 Precordial pain: Secondary | ICD-10-CM | POA: Diagnosis not present

## 2020-05-09 MED ORDER — METOPROLOL TARTRATE 100 MG PO TABS: ORAL_TABLET | ORAL | 0 refills | Status: DC

## 2020-05-09 NOTE — Progress Notes (Signed)
Chief Complaint  Patient presents with  . New Patient (Initial Visit)    chest pain   History of Present Illness: 34 yo male with history of morbid obesity, HTN, hypogonadism, asthma and migraine headaches who is here today as a new patient for the evaluation of chest pain. He tells me today that he has had several episodes of chest pain and dyspnea. It is generally when he is working and getting his heart rate up. The pain is usually associated with shortness of breath. This happened several weeks ago when cutting up a tree and happens when he climbs stairs. No LE edema, dizziness, near syncope or syncope.   Primary Care Physician: Hal Morales, NP   Past Medical History:  Diagnosis Date  . Asthma   . Hyperlipidemia   . Migraine     Past Surgical History:  Procedure Laterality Date  . KNEE SURGERY    . tubes in ears      Current Outpatient Medications  Medication Sig Dispense Refill  . hydrochlorothiazide (HYDRODIURIL) 25 MG tablet daily.  1  . sertraline (ZOLOFT) 25 MG tablet Take 3 tablets (75 mg total) by mouth daily. 90 tablet 5  . metoprolol tartrate (LOPRESSOR) 100 MG tablet Take 1 tablet (100 mg total) two hours prior to CT scan. 1 tablet 0   No current facility-administered medications for this visit.    No Known Allergies  Social History   Socioeconomic History  . Marital status: Married    Spouse name: Not on file  . Number of children: Not on file  . Years of education: Not on file  . Highest education level: Not on file  Occupational History  . Occupation: Firefighter parts.   Tobacco Use  . Smoking status: Never Smoker  . Smokeless tobacco: Former Engineer, water and Sexual Activity  . Alcohol use: No  . Drug use: No  . Sexual activity: Not Currently  Other Topics Concern  . Not on file  Social History Narrative  . Not on file   Social Determinants of Health   Financial Resource Strain:   . Difficulty of Paying Living Expenses: Not on  file  Food Insecurity:   . Worried About Programme researcher, broadcasting/film/video in the Last Year: Not on file  . Ran Out of Food in the Last Year: Not on file  Transportation Needs:   . Lack of Transportation (Medical): Not on file  . Lack of Transportation (Non-Medical): Not on file  Physical Activity:   . Days of Exercise per Week: Not on file  . Minutes of Exercise per Session: Not on file  Stress:   . Feeling of Stress : Not on file  Social Connections:   . Frequency of Communication with Friends and Family: Not on file  . Frequency of Social Gatherings with Friends and Family: Not on file  . Attends Religious Services: Not on file  . Active Member of Clubs or Organizations: Not on file  . Attends Banker Meetings: Not on file  . Marital Status: Not on file  Intimate Partner Violence:   . Fear of Current or Ex-Partner: Not on file  . Emotionally Abused: Not on file  . Physically Abused: Not on file  . Sexually Abused: Not on file    Family History  Problem Relation Age of Onset  . Diabetes Other   . Hyperlipidemia Other   . Hypertension Other   . Diabetes Mother   . Heart disease  Mother   . Diabetes Father   . Heart disease Father   . Hypertension Father   . Other Neg Hx        hypogonadism    Review of Systems:  As stated in the HPI and otherwise negative.   BP (!) 134/104   Pulse 80   Ht 6' (1.829 m)   Wt (!) 389 lb 6.4 oz (176.6 kg)   SpO2 97%   BMI 52.81 kg/m   Physical Examination: General: Well developed, well nourished, NAD  HEENT: OP clear, mucus membranes moist  SKIN: warm, dry. No rashes. Neuro: No focal deficits  Musculoskeletal: Muscle strength 5/5 all ext  Psychiatric: Mood and affect normal  Neck: No JVD, no carotid bruits, no thyromegaly, no lymphadenopathy.  Lungs:Clear bilaterally, no wheezes, rhonci, crackles Cardiovascular: Regular rate and rhythm. No murmurs, gallops or rubs. Abdomen:Soft. Bowel sounds present. Non-tender.  Extremities:  No lower extremity edema. Pulses are 2 + in the bilateral DP/PT.  EKG:  EKG is ordered today. The ekg ordered today demonstrates sinus, rate 80 bpm  Recent Labs: No results found for requested labs within last 8760 hours.   Lipid Panel    Component Value Date/Time   CHOL 249 (H) 08/03/2011 1335   TRIG 311.0 (H) 08/03/2011 1335   HDL 40.60 08/03/2011 1335   CHOLHDL 6 08/03/2011 1335   VLDL 62.2 (H) 08/03/2011 1335   LDLDIRECT 148.6 08/03/2011 1335     Wt Readings from Last 3 Encounters:  05/09/20 (!) 389 lb 6.4 oz (176.6 kg)  02/26/18 297 lb 12.8 oz (135.1 kg)  08/03/11 (!) 337 lb (152.9 kg)      Assessment and Plan:   1. Chest pain: He has exertional chest pain. Risk factors for CAD include HTN, Obesity with FH of CAD in his father. EKG shows no ischemic changes. I will plan a gated cardiac CTA to exclude CAD.   Current medicines are reviewed at length with the patient today.  The patient does not have concerns regarding medicines.  The following changes have been made:  no change  Labs/ tests ordered today include:   Orders Placed This Encounter  Procedures  . CT CORONARY MORPH W/CTA COR W/SCORE W/CA W/CM &/OR WO/CM  . CT CORONARY FRACTIONAL FLOW RESERVE DATA PREP  . CT CORONARY FRACTIONAL FLOW RESERVE FLUID ANALYSIS  . Basic metabolic panel  . EKG 12-Lead     Disposition:   FU with me in 8 weeks.    Signed, Verne Carrow, MD 05/09/2020 4:09 PM    [facility] [address] [place], [place], [place]  [address] Phone: [phone]; Fax: [phone]

## 2020-05-09 NOTE — Patient Instructions (Addendum)
Medication Instructions:  Your physician recommends that you continue on your current medications as directed. Please refer to the Current Medication list given to you today.  *If you need a refill on your cardiac medications before your next appointment, please call your pharmacy*   Testing/Procedures: Your provider recommends that you have a Coronary CTA scan. Please see next page for further instructions.    Follow-Up: At [facility], you and your health needs are our priority.  As part of our continuing mission to provide you with exceptional heart care, we have created designated Provider Care Teams.  These Care Teams include your primary Cardiologist (physician) and Advanced Practice Providers (APPs -  Physician Assistants and Nurse Practitioners) who all work together to provide you with the care you need, when you need it.  Your next appointment:   2 month(s)  The format for your next appointment:   In Person  Provider:   You may see Lauree Chandler, MD or one of the following Advanced Practice Providers on your designated Care Team:    Melina Copa, PA-C  Ermalinda Barrios, PA-C  Other Instructions Your cardiac CT will be scheduled at:   [facility] [address] [place], [place] [address] [phone]  Please arrive at the [facility] main entrance of [facility] 30 minutes prior to test start time. Proceed to the [facility] Radiology Department (first floor) to check-in and test prep.  Please follow these instructions carefully (unless otherwise directed):  Hold all erectile dysfunction medications at least 3 days (72 hrs) prior to test.  On the Night Before the Test: . Be sure to Drink plenty of water. . Do not consume any caffeinated/decaffeinated beverages or chocolate 12 hours prior to your test. . Do not take any antihistamines 12 hours prior to your test.  On the Day of the Test: . Drink plenty of water. Do not drink any water  within one hour of the test. . Do not eat any food 4 hours prior to the test. . You may take your regular medications prior to the test.  . Take metoprolol (Lopressor) two hours prior to test. . HOLD Hydrochlorothiazide morning of the test.  After the Test: . Drink plenty of water. . After receiving IV contrast, you may experience a mild flushed feeling. This is normal. . On occasion, you may experience a mild rash up to 24 hours after the test. This is not dangerous. If this occurs, you can take Benadryl 25 mg and increase your fluid intake. . If you experience trouble breathing, this can be serious. If it is severe call 911 IMMEDIATELY. If it is mild, please call our office. . If you take any of these medications: Glipizide/Metformin, Avandament, Glucavance, please do not take 48 hours after completing test unless otherwise instructed.   Once we have confirmed authorization from your insurance company, we will call you to set up a date and time for your test. Based on how quickly your insurance processes prior authorizations requests, please allow up to 4 weeks to be contacted for scheduling your Cardiac CT appointment. Be advised that routine Cardiac CT appointments could be scheduled as many as 8 weeks after your provider has ordered it.  For non-scheduling related questions, please contact the cardiac imaging nurse navigator should you have any questions/concerns: Marchia Bond, Cardiac Imaging Nurse Navigator Burley Saver, Interim Cardiac Imaging Nurse [place] [facility] Direct Office Dial: [phone]   For scheduling needs, including  cancellations and rescheduling, please call [place], [phone].

## 2020-05-23 ENCOUNTER — Ambulatory Visit: Payer: Self-pay | Admitting: Nurse Practitioner

## 2020-05-26 ENCOUNTER — Telehealth (HOSPITAL_COMMUNITY): Payer: Self-pay | Admitting: Emergency Medicine

## 2020-05-26 NOTE — Telephone Encounter (Signed)
Attempted to call patient regarding upcoming cardiac CT appointment. °Left message on voicemail with name and callback number °Ameria Sanjurjo RN Navigator Cardiac Imaging °[facility] °[phone] Office °[phone] Cell ° °

## 2020-05-27 ENCOUNTER — Telehealth: Payer: Self-pay | Admitting: *Deleted

## 2020-05-27 NOTE — Telephone Encounter (Signed)
Patient called the office to verify that "medication to be taken prior to CT scan had been sent to his pharmacy". He was advised that  Metoprolol tartrate (Lopressor) 100 mg has been sent to pharmacy.

## 2020-05-30 ENCOUNTER — Ambulatory Visit (HOSPITAL_COMMUNITY)
Admission: RE | Admit: 2020-05-30 | Discharge: 2020-05-30 | Disposition: A | Discharge status: Home / Self Care | Payer: 59 | Source: Ambulatory Visit | Attending: Cardiovascular Disease | Admitting: Cardiovascular Disease

## 2020-05-30 ENCOUNTER — Encounter: Payer: 59 | Admitting: *Deleted

## 2020-05-30 ENCOUNTER — Other Ambulatory Visit: Payer: Self-pay

## 2020-05-30 DIAGNOSIS — Z006 Encounter for examination for normal comparison and control in clinical research program: Secondary | ICD-10-CM

## 2020-05-30 DIAGNOSIS — R072 Precordial pain: Secondary | ICD-10-CM

## 2020-05-30 MED ORDER — NITROGLYCERIN 0.4 MG SL SUBL
SUBLINGUAL_TABLET | SUBLINGUAL | Status: AC
  Filled 2020-05-30: qty 2

## 2020-05-30 MED ORDER — IOHEXOL 350 MG/ML SOLN
100.0000 mL | Freq: Once | INTRAVENOUS | Status: AC | PRN
  Administered 2020-05-30: 100 mL via INTRAVENOUS

## 2020-05-30 MED ORDER — NITROGLYCERIN 0.4 MG SL SUBL
0.8000 mg | SUBLINGUAL_TABLET | Freq: Once | SUBLINGUAL | Status: AC
  Administered 2020-05-30: 12:00:00 0.8 mg via SUBLINGUAL

## 2020-05-30 NOTE — Research (Signed)
Subject Name: Frederick Snyder  Subject met inclusion and exclusion criteria.  The informed consent form, study requirements and expectations were reviewed with the subject and questions and concerns were addressed prior to the signing of the consent form.  The subject verbalized understanding of the trial requirements.  The subject agreed to participate in the IDENTIFY trial and signed the informed consent at 1056 on 05/30/20  The informed consent was obtained prior to performance of any protocol-specific procedures for the subject.  A copy of the signed informed consent was given to the subject and a copy was placed in the subject's medical record.   Timoteo Gaul

## 2020-06-23 ENCOUNTER — Other Ambulatory Visit: Payer: Self-pay | Admitting: Physician Assistant

## 2020-06-23 NOTE — Telephone Encounter (Signed)
Okay to refuse? Last apt 0/2020 is what I show

## 2020-07-14 ENCOUNTER — Ambulatory Visit: Payer: 59 | Admitting: Cardiovascular Disease

## 2020-07-14 NOTE — Progress Notes (Deleted)
No chief complaint on file.  History of Present Illness: 35 yo male with history of morbid obesity, HTN, hypogonadism, asthma and migraine headaches who is here today for follow up. I saw him as a new patient 05/09/20 for the evaluation of chest pain. He described several episodes of chest pain and dyspnea. It is generally when he is working and getting his heart rate up. The pain is usually associated with shortness of breath. This happened when cutting up a tree and happens when he climbs stairs. No LE edema, dizziness, near syncope or syncope. Gated cardiac cTA 06/01/20 with calcium score zero, no evidence of CAD. 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   He is here today for follow up. The patient denies any chest pain, dyspnea, palpitations, lower extremity edema, orthopnea, PND, dizziness, near syncope or syncope.    Primary Care Physician: Patient, No Pcp Per   Past Medical History:  Diagnosis Date  . Asthma   . Hyperlipidemia   . Migraine     Past Surgical History:  Procedure Laterality Date  . KNEE SURGERY    . tubes in ears      Current Outpatient Medications  Medication Sig Dispense Refill  . hydrochlorothiazide (HYDRODIURIL) 25 MG tablet daily.  1  . metoprolol tartrate (LOPRESSOR) 100 MG tablet Take 1 tablet (100 mg total) two hours prior to CT scan. 1 tablet 0  . sertraline (ZOLOFT) 25 MG tablet Take 3 tablets (75 mg total) by mouth daily. 90 tablet 5   No current facility-administered medications for this visit.    No Known Allergies  Social History   Socioeconomic History  . Marital status: Married    Spouse name: Not on file  . Number of children: Not on file  . Years of education: Not on file  . Highest education level: Not on file  Occupational History  . Occupation: Firefighter parts.   Tobacco Use  . Smoking status: Never Smoker  . Smokeless tobacco: Former Engineer, water and Sexual Activity  . Alcohol use: No  . Drug use: No  .  Sexual activity: Not Currently  Other Topics Concern  . Not on file  Social History Narrative  . Not on file   Social Determinants of Health   Financial Resource Strain: Not on file  Food Insecurity: Not on file  Transportation Needs: Not on file  Physical Activity: Not on file  Stress: Not on file  Social Connections: Not on file  Intimate Partner Violence: Not on file    Family History  Problem Relation Age of Onset  . Diabetes Other   . Hyperlipidemia Other   . Hypertension Other   . Diabetes Mother   . Heart disease Mother   . Diabetes Father   . Heart disease Father   . Hypertension Father   . Other Neg Hx        hypogonadism    Review of Systems:  As stated in the HPI and otherwise negative.   There were no vitals taken for this visit.  Physical Examination: General: Well developed, well nourished, NAD  HEENT: OP clear, mucus membranes moist  SKIN: warm, dry. No rashes. Neuro: No focal deficits  Musculoskeletal: Muscle strength 5/5 all ext  Psychiatric: Mood and affect normal  Neck: No JVD, no carotid bruits, no thyromegaly, no lymphadenopathy.  Lungs:Clear bilaterally, no wheezes, rhonci, crackles Cardiovascular: Regular rate and rhythm. No murmurs, gallops or rubs. Abdomen:Soft. Bowel sounds present. Non-tender.  Extremities: No lower extremity edema. Pulses are 2 + in the bilateral DP/PT.  EKG:  EKG is not *** ordered today. The ekg ordered today demonstrates   Recent Labs: No results found for requested labs within last 8760 hours.   Lipid Panel    Component Value Date/Time   CHOL 249 (H) 08/03/2011 1335   TRIG 311.0 (H) 08/03/2011 1335   HDL 40.60 08/03/2011 1335   CHOLHDL 6 08/03/2011 1335   VLDL 62.2 (H) 08/03/2011 1335   LDLDIRECT 148.6 08/03/2011 1335     Wt Readings from Last 3 Encounters:  05/09/20 (!) 389 lb 6.4 oz (176.6 kg)  02/26/18 297 lb 12.8 oz (135.1 kg)  08/03/11 (!) 337 lb (152.9 kg)      Assessment and Plan:   1.  Chest pain: His chest pain is not felt to be cardiac related. Gated cardiac CTA with no evidence of CAD. Calcium score of zero.    Current medicines are reviewed at length with the patient today.  The patient does not have concerns regarding medicines.  The following changes have been made:  no change  Labs/ tests ordered today include:   No orders of the defined types were placed in this encounter.    Disposition:   FU with me in 8 weeks.    Signed, Verne Carrow, MD 07/14/2020 8:26 AM    [facility] [address] [place], [place], [place]  [address] Phone: [phone]; Fax: [phone]

## 2020-09-07 ENCOUNTER — Ambulatory Visit: Payer: 59 | Admitting: Internal Medicine

## 2020-09-07 ENCOUNTER — Other Ambulatory Visit: Payer: Self-pay

## 2020-09-07 ENCOUNTER — Encounter: Payer: Self-pay | Admitting: Internal Medicine

## 2020-09-07 VITALS — BP 164/106 | HR 97 | Temp 98.4°F | Resp 16 | Ht 72.0 in | Wt 393.0 lb

## 2020-09-07 DIAGNOSIS — K4090 Unilateral inguinal hernia, without obstruction or gangrene, not specified as recurrent: Secondary | ICD-10-CM | POA: Diagnosis not present

## 2020-09-07 DIAGNOSIS — I10 Essential (primary) hypertension: Secondary | ICD-10-CM | POA: Diagnosis not present

## 2020-09-07 DIAGNOSIS — Z23 Encounter for immunization: Secondary | ICD-10-CM

## 2020-09-07 DIAGNOSIS — Z6841 Body Mass Index (BMI) 40.0 and over, adult: Secondary | ICD-10-CM

## 2020-09-07 DIAGNOSIS — E559 Vitamin D deficiency, unspecified: Secondary | ICD-10-CM

## 2020-09-07 DIAGNOSIS — Z0000 Encounter for general adult medical examination without abnormal findings: Secondary | ICD-10-CM | POA: Diagnosis not present

## 2020-09-07 DIAGNOSIS — R0683 Snoring: Secondary | ICD-10-CM

## 2020-09-07 DIAGNOSIS — Z0001 Encounter for general adult medical examination with abnormal findings: Secondary | ICD-10-CM

## 2020-09-07 LAB — URINALYSIS, ROUTINE W REFLEX MICROSCOPIC
Bilirubin Urine: NEGATIVE
Hgb urine dipstick: NEGATIVE
Ketones, ur: NEGATIVE
Leukocytes,Ua: NEGATIVE
Nitrite: NEGATIVE
RBC / HPF: NONE SEEN (ref 0–?)
Specific Gravity, Urine: >=1.03 — AB (ref 1.000–1.030)
Total Protein, Urine: NEGATIVE
Urine Glucose: NEGATIVE
Urobilinogen, UA: 0.2 (ref 0.0–1.0)
WBC, UA: NONE SEEN (ref 0–?)
pH: 5.5 (ref 5.0–8.0)

## 2020-09-07 LAB — BASIC METABOLIC PANEL
BUN: 14 mg/dL (ref 6–23)
CO2: 27 mEq/L (ref 19–32)
Calcium: 9.5 mg/dL (ref 8.4–10.5)
Chloride: 101 mEq/L (ref 96–112)
Creatinine, Ser: 0.99 mg/dL (ref 0.40–1.50)
GFR: 99.45 mL/min (ref >60.00)
Glucose, Bld: 96 mg/dL (ref 70–99)
Potassium: 3.8 mEq/L (ref 3.5–5.1)
Sodium: 138 mEq/L (ref 135–145)

## 2020-09-07 LAB — LIPID PANEL
Cholesterol: 215 mg/dL — ABNORMAL HIGH (ref 0–200)
HDL: 31.7 mg/dL — ABNORMAL LOW (ref >39.00)
NonHDL: 183.02
Total CHOL/HDL Ratio: 7
Triglycerides: 308 mg/dL — ABNORMAL HIGH (ref 0.0–149.0)
VLDL: 61.6 mg/dL — ABNORMAL HIGH (ref 0.0–40.0)

## 2020-09-07 LAB — CBC WITH DIFFERENTIAL/PLATELET
Basophils Absolute: 0.1 10*3/uL (ref 0.0–0.1)
Basophils Relative: 1.1 % (ref 0.0–3.0)
Eosinophils Absolute: 0.4 10*3/uL (ref 0.0–0.7)
Eosinophils Relative: 5.1 % — ABNORMAL HIGH (ref 0.0–5.0)
HCT: 44.8 % (ref 39.0–52.0)
Hemoglobin: 15.2 g/dL (ref 13.0–17.0)
Lymphocytes Relative: 35.1 % (ref 12.0–46.0)
Lymphs Abs: 2.9 10*3/uL (ref 0.7–4.0)
MCHC: 34 g/dL (ref 30.0–36.0)
MCV: 78.3 fl (ref 78.0–100.0)
Monocytes Absolute: 0.7 10*3/uL (ref 0.1–1.0)
Monocytes Relative: 8.6 % (ref 3.0–12.0)
Neutro Abs: 4.1 10*3/uL (ref 1.4–7.7)
Neutrophils Relative %: 50.1 % (ref 43.0–77.0)
Platelets: 262 10*3/uL (ref 150.0–400.0)
RBC: 5.72 Mil/uL (ref 4.22–5.81)
RDW: 15.4 % (ref 11.5–15.5)
WBC: 8.2 10*3/uL (ref 4.0–10.5)

## 2020-09-07 LAB — TSH: TSH: 1.48 u[IU]/mL (ref 0.35–4.50)

## 2020-09-07 LAB — HEPATIC FUNCTION PANEL
ALT: 17 U/L (ref 0–53)
AST: 13 U/L (ref 0–37)
Albumin: 4.6 g/dL (ref 3.5–5.2)
Alkaline Phosphatase: 89 U/L (ref 39–117)
Bilirubin, Direct: 0 mg/dL (ref 0.0–0.3)
Total Bilirubin: 0.5 mg/dL (ref 0.2–1.2)
Total Protein: 7.8 g/dL (ref 6.0–8.3)

## 2020-09-07 LAB — VITAMIN D 25 HYDROXY (VIT D DEFICIENCY, FRACTURES): VITD: 9.34 ng/mL — ABNORMAL LOW (ref 30.00–100.00)

## 2020-09-07 LAB — LDL CHOLESTEROL, DIRECT: Direct LDL: 114 mg/dL

## 2020-09-07 LAB — HEMOGLOBIN A1C: Hgb A1c MFr Bld: 5.8 % (ref 4.6–6.5)

## 2020-09-07 MED ORDER — EDARBYCLOR 40-12.5 MG PO TABS
1.0000 | ORAL_TABLET | Freq: Every day | ORAL | 0 refills | Status: DC
Start: 2020-09-07 — End: 2020-10-16

## 2020-09-07 MED ORDER — CHOLECALCIFEROL 1.25 MG (50000 UT) PO CAPS: 50000.0000 [IU] | ORAL_CAPSULE | ORAL | 1 refills | Status: DC

## 2020-09-07 NOTE — Progress Notes (Signed)
Subjective:  Patient ID: Frederick Snyder, male    DOB: [birth year]  Age: 35 y.o. MRN: [id number]  CC: Annual Exam and Hypertension  This visit occurred during the SARS-CoV-2 public health emergency.  Safety protocols were in place, including screening questions prior to the visit, additional usage of staff PPE, and extensive cleaning of exam room while observing appropriate contact time as indicated for disinfecting solutions.    HPI Frederick Snyder presents for a CPX.  For several months he has had intermittent discomfort in his left groin.  He thinks he has an inguinal hernia.  He has a history of hypertension but is not currently taking an antihypertensive.  He briefly took hydrochlorothiazide but complains that it caused frequent urination so he no longer takes anything.  His wife complains about his snoring.  He feels like his sleep is not restorative and he complains of fatigue and weight gain.  He is active and denies CP, DOE, palpitations, dizziness, lightheadedness, or diaphoresis.   History Frederick Snyder has a past medical history of Asthma, Hyperlipidemia, and Migraine.   He has a past surgical history that includes Knee surgery and tubes in ears.   His family history includes Diabetes in his father, mother, and another family member; Heart disease in his father and mother; Hyperlipidemia in an other family member; Hypertension in his father and another family member.He reports that he has never smoked. He has quit using smokeless tobacco. He reports that he does not drink alcohol and does not use drugs.  Outpatient Medications Prior to Visit  Medication Sig Dispense Refill  . hydrochlorothiazide (HYDRODIURIL) 25 MG tablet daily.  1  . metoprolol tartrate (LOPRESSOR) 100 MG tablet Take 1 tablet (100 mg total) two hours prior to CT scan. 1 tablet 0  . sertraline (ZOLOFT) 25 MG tablet Take 3 tablets (75 mg total) by mouth daily. 90 tablet 5   No facility-administered  medications prior to visit.    ROS Review of Systems  Constitutional: Positive for fatigue and unexpected weight change. Negative for chills, diaphoresis and fever.  HENT: Negative.   Eyes: Negative.   Respiratory: Negative for cough, chest tightness, shortness of breath and wheezing.   Cardiovascular: Negative for chest pain, palpitations and leg swelling.  Gastrointestinal: Negative for abdominal pain, blood in stool, constipation, diarrhea, nausea and vomiting.  Endocrine: Negative.   Genitourinary: Negative.  Negative for difficulty urinating, dysuria, hematuria, scrotal swelling, testicular pain and urgency.  Musculoskeletal: Negative for arthralgias and myalgias.  Skin: Negative.   Neurological: Negative.  Negative for dizziness, weakness, light-headedness, numbness and headaches.  Hematological: Negative for adenopathy. Does not bruise/bleed easily.  Psychiatric/Behavioral: Negative.     Objective:  BP (!) 164/106 (BP Location: Right Arm, Patient Position: Sitting, Cuff Size: Large)   Pulse 97   Temp 98.4 F (36.9 C) (Oral)   Resp 16   Ht 6' (1.829 m)   Wt (!) 393 lb (178.3 kg)   SpO2 99%   BMI 53.30 kg/m   Physical Exam Vitals reviewed.  Constitutional:      General: He is not in acute distress.    Appearance: He is obese. He is not toxic-appearing or diaphoretic.  HENT:     Nose: Nose normal.     Mouth/Throat:     Mouth: Mucous membranes are moist.  Eyes:     General: No scleral icterus.    Conjunctiva/sclera: Conjunctivae normal.  Cardiovascular:     Rate and Rhythm: Normal rate and regular rhythm.  Pulses: Normal pulses.     Comments: EKG- NSR, 100 bpm Low voltage c/w habitus No LVH Pulmonary:     Effort: Pulmonary effort is normal.     Breath sounds: No stridor. No wheezing, rhonchi or rales.  Abdominal:     General: Abdomen is protuberant. There is no distension.     Palpations: Abdomen is soft. There is no hepatomegaly, splenomegaly or mass.      Tenderness: There is no abdominal tenderness.     Hernia: A hernia is present. Hernia is present in the left inguinal area. There is no hernia in the right inguinal area.  Genitourinary:    Penis: Normal.      Testes: Normal.     Epididymis:     Right: Normal.     Left: Normal.  Musculoskeletal:        General: Normal range of motion.     Cervical back: Neck supple.     Right lower leg: No edema.     Left lower leg: No edema.  Lymphadenopathy:     Lower Body: No right inguinal adenopathy. No left inguinal adenopathy.  Skin:    General: Skin is warm and dry.     Coloration: Skin is not pale.  Neurological:     General: No focal deficit present.     Mental Status: He is alert.  Psychiatric:        Mood and Affect: Mood normal.        Behavior: Behavior normal.     Lab Results  Component Value Date   WBC 8.2 09/07/2020   HGB 15.2 09/07/2020   HCT 44.8 09/07/2020   PLT 262.0 09/07/2020   GLUCOSE 96 09/07/2020   CHOL 215 (H) 09/07/2020   TRIG 308.0 (H) 09/07/2020   HDL 31.70 (L) 09/07/2020   LDLDIRECT 114.0 09/07/2020   ALT 17 09/07/2020   AST 13 09/07/2020   NA 138 09/07/2020   K 3.8 09/07/2020   CL 101 09/07/2020   CREATININE 0.99 09/07/2020   BUN 14 09/07/2020   CO2 27 09/07/2020   TSH 1.48 09/07/2020   HGBA1C 5.8 09/07/2020    Assessment & Plan:   Frederick Snyder was seen today for annual exam and hypertension.  Diagnoses and all orders for this visit:  Primary hypertension- His blood pressure is not adequately well controlled.  His EKG is negative for LVH.  Labs are negative for secondary causes or endorgan damage.  Will start treating this with an ARB and thiazide diuretic. -     Aldosterone + renin activity w/ ratio; Future -     Basic metabolic panel; Future -     TSH; Future -     Urinalysis, Routine w reflex microscopic; Future -     VITAMIN D 25 Hydroxy (Vit-D Deficiency, Fractures); Future -     Hepatic function panel; Future -     CBC with  Differential/Platelet; Future -     EKG 12-Lead -     CBC with Differential/Platelet -     Hepatic function panel -     VITAMIN D 25 Hydroxy (Vit-D Deficiency, Fractures) -     Urinalysis, Routine w reflex microscopic -     TSH -     Basic metabolic panel -     Aldosterone + renin activity w/ ratio -     Azilsartan-Chlorthalidone (EDARBYCLOR) 40-12.5 MG TABS; Take 1 tablet by mouth daily.  Morbid obesity with BMI of 50.0-59.9, adult [facility])- Labs are negative for  secondary causes or complications.  I have asked him to improve his lifestyle modifications. -     Basic metabolic panel; Future -     TSH; Future -     Hemoglobin A1c; Future -     Hemoglobin A1c -     TSH -     Basic metabolic panel  Encounter for general adult medical examination with abnormal findings- Exam completed, labs reviewed, vaccines reviewed and updated, no cancer screenings are indicated, patient education was given. -     Lipid panel; Future -     Hepatitis C antibody; Future -     HIV Antibody (routine testing w rflx); Future -     HIV Antibody (routine testing w rflx) -     Hepatitis C antibody -     Lipid panel  Loud snoring -     Ambulatory referral to Sleep Studies  Left inguinal hernia -     Ambulatory referral to General Surgery  Vitamin D deficiency disease -     Cholecalciferol 1.25 MG (50000 UT) capsule; Take 1 capsule (50,000 Units total) by mouth once a week.  Other orders -     LDL cholesterol, direct   I have discontinued Lesia Sago. Lecomte's hydrochlorothiazide, sertraline, and metoprolol tartrate. I am also having him start on Cholecalciferol and Edarbyclor.  Meds ordered this encounter  Medications  . Cholecalciferol 1.25 MG (50000 UT) capsule    Sig: Take 1 capsule (50,000 Units total) by mouth once a week.    Dispense:  12 capsule    Refill:  1  . Azilsartan-Chlorthalidone (EDARBYCLOR) 40-12.5 MG TABS    Sig: Take 1 tablet by mouth daily.    Dispense:  90 tablet    Refill:   0     Follow-up: Return in about 6 weeks (around 10/19/2020).  Sanda Linger, MD

## 2020-09-07 NOTE — Patient Instructions (Signed)

## 2020-09-12 NOTE — Addendum Note (Signed)
Addended by: Darryll Capers on: 09/12/2020 03:42 PM   Modules accepted: Orders

## 2020-09-15 ENCOUNTER — Encounter: Payer: Self-pay | Admitting: Internal Medicine

## 2020-09-15 LAB — HEPATITIS C ANTIBODY
Hepatitis C Ab: NONREACTIVE
SIGNAL TO CUT-OFF: 0.04 (ref <1.00)

## 2020-09-15 LAB — HIV ANTIBODY (ROUTINE TESTING W REFLEX): HIV 1&2 Ab, 4th Generation: NONREACTIVE

## 2020-09-15 LAB — ALDOSTERONE + RENIN ACTIVITY W/ RATIO
ALDO / PRA Ratio: 3.1 Ratio (ref 0.9–28.9)
Aldosterone: 11 ng/dL
Renin Activity: 3.55 ng/mL/h (ref 0.25–5.82)

## 2020-09-16 ENCOUNTER — Other Ambulatory Visit: Payer: Self-pay | Admitting: Internal Medicine

## 2020-09-16 DIAGNOSIS — J4541 Moderate persistent asthma with (acute) exacerbation: Secondary | ICD-10-CM

## 2020-09-16 MED ORDER — METHYLPREDNISOLONE 4 MG PO TBPK: ORAL_TABLET | ORAL | 0 refills | Status: AC

## 2020-09-16 MED ORDER — TRELEGY ELLIPTA 200-62.5-25 MCG/INH IN AEPB: 1.0000 | INHALATION_SPRAY | Freq: Every day | RESPIRATORY_TRACT | 1 refills | Status: DC

## 2020-09-16 MED ORDER — MONTELUKAST SODIUM 10 MG PO TABS: 10.0000 mg | ORAL_TABLET | Freq: Every day | ORAL | 1 refills | Status: DC

## 2020-10-16 ENCOUNTER — Other Ambulatory Visit: Payer: Self-pay | Admitting: Internal Medicine

## 2020-10-16 DIAGNOSIS — I10 Essential (primary) hypertension: Secondary | ICD-10-CM

## 2020-10-17 ENCOUNTER — Other Ambulatory Visit: Payer: Self-pay | Admitting: Surgery

## 2020-10-17 MED ORDER — EDARBYCLOR 40-12.5 MG PO TABS
1.0000 | ORAL_TABLET | Freq: Every day | ORAL | 0 refills | Status: DC
Start: 2020-10-17 — End: 2020-11-25

## 2020-10-20 ENCOUNTER — Telehealth: Payer: Self-pay

## 2020-10-20 DIAGNOSIS — Z006 Encounter for examination for normal comparison and control in clinical research program: Secondary | ICD-10-CM

## 2020-10-20 NOTE — Telephone Encounter (Signed)
I called patient for his 90-day Identify Study follow up phone call. Patient is doing well with no cardiac symptoms at this time. I reminded patient I would call him in Dec. for 1 year follow-up. 

## 2020-10-24 ENCOUNTER — Encounter: Payer: Self-pay | Admitting: Neurology

## 2020-10-24 ENCOUNTER — Ambulatory Visit: Payer: 59 | Admitting: Neurology

## 2020-10-24 ENCOUNTER — Other Ambulatory Visit: Payer: Self-pay

## 2020-10-24 VITALS — BP 129/90 | HR 91 | Ht 72.0 in | Wt 392.6 lb

## 2020-10-24 DIAGNOSIS — G4719 Other hypersomnia: Secondary | ICD-10-CM

## 2020-10-24 DIAGNOSIS — R0681 Apnea, not elsewhere classified: Secondary | ICD-10-CM

## 2020-10-24 DIAGNOSIS — R0683 Snoring: Secondary | ICD-10-CM

## 2020-10-24 DIAGNOSIS — Z6841 Body Mass Index (BMI) 40.0 and over, adult: Secondary | ICD-10-CM

## 2020-10-24 DIAGNOSIS — R030 Elevated blood-pressure reading, without diagnosis of hypertension: Secondary | ICD-10-CM

## 2020-10-24 DIAGNOSIS — Z820 Family history of epilepsy and other diseases of the nervous system: Secondary | ICD-10-CM

## 2020-10-24 DIAGNOSIS — R351 Nocturia: Secondary | ICD-10-CM

## 2020-10-24 DIAGNOSIS — R519 Headache, unspecified: Secondary | ICD-10-CM

## 2020-10-24 NOTE — Patient Instructions (Signed)

## 2020-10-24 NOTE — Progress Notes (Signed)
Subjective:    Patient ID: Frederick Snyder is a 35 y.o. male.  HPI     Frederick Foley, MD, PhD [facility] [address], [address] [address] [place], [place] [address]  Dear Dr. Yetta Barre,   I saw your patient, Frederick Snyder, upon your kind request in my sleep clinic today for initial consultation of his sleep disorder, in particular, concern for underlying obstructive sleep apnea.  The patient is unaccompanied today.  As you know, Frederick Snyder is a 35 year old right-handed gentleman with an underlying medical history of asthma, migraine headaches, vitamin D deficiency, suboptimally controlled hypertension, and morbid obesity with a BMI of over 50, who reports snoring and excessive daytime somnolence.  I reviewed your note from 09/07/2020.  His Epworth sleepiness score is 9 out of 24, fatigue severity score is 53 out of 63.  He reports difficulty maintaining sleep and waking up in the middle of the night, he has woken up with a sense of gasping for air and his wife has noted apneas while he is asleep.  His parents had sleep apnea, mom also had congestive heart failure and passed away at age 8 and father passed away at 26.  His dad had weight loss surgery.  Patient has considered weight loss surgery.  He is scheduled to have left inguinal hernia surgery next month and wants to talk to his general surgeon about bariatric surgery as well.  His weight has increased in the past few years but stable in the past year.  He lives with his wife and 4 year old daughter.  He also has an older daughter, age 35.  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  They have cats in the household, no TV in the bedroom.  He is a non-smoker and drinks alcohol rarely, maybe twice a year and quite a few servings of caffeine in the form of coffee, tea and soda, he estimates about 4-5 servings per day on average.  He has woken up occasionally with a dull, achy headache, different from his  typical migraines.  He has nocturia about twice per average night.  His Past Medical History Is Significant For: Past Medical History:  Diagnosis Date  . Asthma   . Hyperlipidemia   . Hypertension   . Migraine     His Past Surgical History Is Significant For: Past Surgical History:  Procedure Laterality Date  . KNEE SURGERY    . tubes in ears    . WISDOM TOOTH EXTRACTION      His Family History Is Significant For: Family History  Problem Relation Age of Onset  . Diabetes Other   . Hyperlipidemia Other   . Hypertension Other   . Diabetes Mother   . Heart disease Mother   . Diabetes Father   . Heart disease Father   . Hypertension Father   . Other Neg Hx        hypogonadism    His Social History Is Significant For: Social History   Socioeconomic History  . Marital status: Married    Spouse name: Not on file  . Number of children: Not on file  . Years of education: Not on file  . Highest education level: Not on file  Occupational History  . Occupation: Firefighter parts.   Tobacco Use  . Smoking status: Never Smoker  . Smokeless tobacco: Former Engineer, water and Sexual Activity  . Alcohol use: No  . Drug use: No  . Sexual  activity: Yes    Partners: Female  Other Topics Concern  . Not on file  Social History Narrative   Lives at home with wife and child   Social Determinants of Health   Financial Resource Strain: Not on file  Food Insecurity: Not on file  Transportation Needs: Not on file  Physical Activity: Not on file  Stress: Not on file  Social Connections: Not on file    His Allergies Are:  No Known Allergies:   His Current Medications Are:  Outpatient Encounter Medications as of 10/24/2020  Medication Sig  . Azilsartan-Chlorthalidone (EDARBYCLOR) 40-12.5 MG TABS Take 1 tablet by mouth daily.  . Cholecalciferol 1.25 MG (50000 UT) capsule Take 1 capsule (50,000 Units total) by mouth once a week.  . Fluticasone-Umeclidin-Vilant (TRELEGY  ELLIPTA) 200-62.5-25 MCG/INH AEPB Inhale 1 puff into the lungs daily.  . montelukast (SINGULAIR) 10 MG tablet Take 1 tablet (10 mg total) by mouth at bedtime.   No facility-administered encounter medications on file as of 10/24/2020.  :   Review of Systems:  Out of a complete 14 point review of systems, all are reviewed and negative with the exception of these symptoms as listed below:  Review of Systems  Neurological:       Here for sleep consult. Pt reports no prior sleep study. Reports he does snore at night and daytime fatigue is present. He also sts he wakes up multiple times during the night.   Epworth Sleepiness Scale 0= would never doze 1= slight chance of dozing 2= moderate chance of dozing 3= high chance of dozing  Sitting and reading:2 Watching TV:0 Sitting inactive in a public place (ex. Theater or meeting):1 As a passenger in a car for an hour without a break:1 Lying down to rest in the afternoon:3 Sitting and talking to someone:0 Sitting quietly after lunch (no alcohol):2 In a car, while stopped in traffic:0 Total:9     Objective:  Neurological Exam  Physical Exam Physical Examination:   Vitals:   10/24/20 1332  BP: 129/90  Pulse: 91    General Examination: The patient is a very pleasant 35 y.o. male in no acute distress. He appears well-developed and well-nourished and well groomed.   HEENT: Normocephalic, atraumatic, pupils are equal, round and reactive to light, extraocular tracking is good without limitation to gaze excursion or nystagmus noted. Hearing is grossly intact. Face is symmetric with normal facial animation. Speech is clear with no dysarthria noted. There is no hypophonia. There is no lip, neck/head, jaw or voice tremor. Neck is supple with full range of passive and active motion. There are no carotid bruits on auscultation. Oropharynx exam reveals: mild mouth dryness, adequate dental hygiene and mild airway crowding, due to small airway  entry, Mallampati class II, tonsils small, tongue protrudes centrally and palate elevates symmetrically, neck circumference of 18-7/8 inches.  He has a mild overbite.  Evidence of tooth grinding noted.  Chest: Clear to auscultation without wheezing, rhonchi or crackles noted.  Heart: S1+S2+0, regular and normal without murmurs, rubs or gallops noted.   Abdomen: Soft, non-tender and non-distended with normal bowel sounds appreciated on auscultation.  Extremities: There is no pitting edema in the distal lower extremities bilaterally.   Skin: Warm and dry without trophic changes noted.   Musculoskeletal: exam reveals no obvious joint deformities, tenderness or joint swelling or erythema.   Neurologically:  Mental status: The patient is awake, alert and oriented in all 4 spheres. His immediate and remote memory, attention, language  skills and fund of knowledge are appropriate. There is no evidence of aphasia, agnosia, apraxia or anomia. Speech is clear with normal prosody and enunciation. Thought process is linear. Mood is normal and affect is normal.  Cranial nerves II - XII are as described above under HEENT exam.  Motor exam: Normal bulk, strength and tone is noted. There is no tremor, fine motor skills and coordination: grossly intact.  Cerebellar testing: No dysmetria or intention tremor. There is no truncal or gait ataxia.  Sensory exam: intact to light touch in the upper and lower extremities.  Gait, station and balance: He stands easily. No veering to one side is noted. No leaning to one side is noted. Posture is age-appropriate and stance is narrow based. Gait shows normal stride length and normal pace. No problems turning are noted.   Assessment and Plan:   In summary, Frederick Snyder is a very pleasant 35 y.o.-year old male with an underlying medical history of asthma, migraine headaches, vitamin D deficiency, suboptimally controlled hypertension, and morbid obesity with a BMI of  over 50, whose history and physical exam are concerning for obstructive sleep apnea (OSA). I had a long chat with the patient about my findings and the diagnosis of OSA, its prognosis and treatment options. We talked about medical treatments, surgical interventions and non-pharmacological approaches. I explained in particular the risks and ramifications of untreated moderate to severe OSA, especially with respect to developing cardiovascular disease down the Road, including congestive heart failure, difficult to treat hypertension, cardiac arrhythmias, or stroke. Even type 2 diabetes has, in part, been linked to untreated OSA. Symptoms of untreated OSA include daytime sleepiness, memory problems, mood irritability and mood disorder such as depression and anxiety, lack of energy, as well as recurrent headaches, especially morning headaches. We talked about trying to maintain a healthy lifestyle in general, as well as the importance of weight control. We also talked about the importance of good sleep hygiene. I recommended the following at this time: sleep study.   I explained the sleep test procedure to the patient and also outlined possible surgical and non-surgical treatment options of OSA, including the use of a custom-made dental device (which would require a referral to a specialist dentist or oral surgeon), upper airway surgical options, such as traditional UPPP or a novel less invasive surgical option in the form of Inspire hypoglossal nerve stimulation (which would involve a referral to an ENT surgeon). I also explained the CPAP treatment option to the patient, who indicated that he would be willing to try CPAP if the need arises. I explained the importance of being compliant with PAP treatment, not only for insurance purposes but primarily to improve His symptoms, and for the patient's long term health benefit, including to reduce His cardiovascular risks. I answered all his questions today and the  patient was in agreement. I plan to see him back after the sleep study is completed and encouraged him to call with any interim questions, concerns, problems or updates.   Thank you very much for allowing me to participate in the care of this nice patient. If I can be of any further assistance to you please do not hesitate to call me at [phone].  Sincerely,   Frederick Foley, MD, PhD

## 2020-11-11 ENCOUNTER — Other Ambulatory Visit (HOSPITAL_COMMUNITY): Payer: 59

## 2020-11-14 ENCOUNTER — Ambulatory Visit (HOSPITAL_BASED_OUTPATIENT_CLINIC_OR_DEPARTMENT_OTHER): Admit: 2020-11-14 | Payer: 59 | Admitting: Surgery

## 2020-11-14 ENCOUNTER — Encounter (HOSPITAL_BASED_OUTPATIENT_CLINIC_OR_DEPARTMENT_OTHER): Payer: Self-pay

## 2020-11-14 SURGERY — REPAIR, HERNIA, INGUINAL, ADULT
Anesthesia: General | Laterality: Left

## 2020-11-18 ENCOUNTER — Telehealth: Payer: Self-pay

## 2020-11-18 NOTE — Telephone Encounter (Signed)
LVM for pt to call me back to schedule sleep study  

## 2020-11-22 ENCOUNTER — Telehealth: Payer: Self-pay

## 2020-11-22 NOTE — Telephone Encounter (Signed)
Key: [employer]

## 2020-11-22 NOTE — Telephone Encounter (Signed)
Key: [id number]

## 2020-11-24 NOTE — Telephone Encounter (Signed)
Per CoverMyMeds-  This medication or product is on your plan's list of covered drugs. Prior authorization is not required at this time. 

## 2020-11-24 NOTE — Telephone Encounter (Signed)
Per CoverMyMeds:  [id number] has been terminated for Edarbyclor Tab 40-12.5, use as directed (30 per month), for the following reason: Prior authorization is not required at this time

## 2020-11-25 ENCOUNTER — Other Ambulatory Visit: Payer: Self-pay | Admitting: Internal Medicine

## 2020-11-25 ENCOUNTER — Encounter: Payer: Self-pay | Admitting: Internal Medicine

## 2020-11-25 DIAGNOSIS — I10 Essential (primary) hypertension: Secondary | ICD-10-CM

## 2020-11-25 DIAGNOSIS — E559 Vitamin D deficiency, unspecified: Secondary | ICD-10-CM

## 2020-11-25 DIAGNOSIS — J4541 Moderate persistent asthma with (acute) exacerbation: Secondary | ICD-10-CM

## 2020-11-25 MED ORDER — MONTELUKAST SODIUM 10 MG PO TABS: 10.0000 mg | ORAL_TABLET | Freq: Every day | ORAL | 1 refills | Status: DC

## 2020-11-25 MED ORDER — TRELEGY ELLIPTA 200-62.5-25 MCG/INH IN AEPB: 1.0000 | INHALATION_SPRAY | Freq: Every day | RESPIRATORY_TRACT | 1 refills | Status: DC

## 2020-11-25 MED ORDER — EDARBYCLOR 40-12.5 MG PO TABS
1.0000 | ORAL_TABLET | Freq: Every day | ORAL | 0 refills | Status: DC
Start: 2020-11-25 — End: 2021-01-06

## 2020-11-25 MED ORDER — CHOLECALCIFEROL 1.25 MG (50000 UT) PO CAPS: 50000.0000 [IU] | ORAL_CAPSULE | ORAL | 0 refills | Status: DC

## 2020-12-05 ENCOUNTER — Other Ambulatory Visit (HOSPITAL_COMMUNITY)
Admission: RE | Admit: 2020-12-05 | Discharge: 2020-12-05 | Disposition: A | Discharge status: Home / Self Care | Payer: 59 | Source: Ambulatory Visit | Attending: Surgery | Admitting: Surgery

## 2020-12-05 DIAGNOSIS — Z20822 Contact with and (suspected) exposure to covid-19: Secondary | ICD-10-CM | POA: Insufficient documentation

## 2020-12-05 DIAGNOSIS — Z01812 Encounter for preprocedural laboratory examination: Secondary | ICD-10-CM | POA: Diagnosis present

## 2020-12-06 LAB — SARS CORONAVIRUS 2 (TAT 6-24 HRS): SARS Coronavirus 2: NEGATIVE

## 2020-12-07 ENCOUNTER — Encounter (HOSPITAL_COMMUNITY): Payer: Self-pay | Admitting: Surgery

## 2020-12-07 NOTE — H&P (Signed)
Frederick Snyder  Location: [facility] Patient #: [id number] DOB: [birth year] Married / Language: English / Race: White Male   History of Present Illness  The patient is a 35 year old male who presents with an inguinal hernia.  Chief complaint: Left inguinal hernia  This is a pleasant 35 year old gentleman who is referred here for evaluation of a left inguinal hernia.  He has been lifting at work when he felt a small pulling sensation.  Eventually noticed a small bulge in the left inguinal area with only mild discomfort and no obstructive symptoms.  He is asymptomatic today.  He is otherwise without complaints.  He is otherwise doing well.   Past Surgical History Santiago Glad, CMA; Knee Surgery   Left. Oral Surgery    Diagnostic Studies History Santiago Glad, [place]; Colonoscopy   never  Allergies  No Known Drug Allergies  Allergies Reconciled    Medication History  Montelukast Sodium  (10MG  Tablet, Oral) Active. Azilsartan-Chlorthalidone  (40-12.5MG  Tablet, Oral) Active. Cholecalciferol  (1.25 MG(50000 UT) Tablet, Oral) Active. Fluticasone-Umeclidin-Vilant  (100-62.5-25MCG/INH Aero Pow Br Act, Inhalation) Active. Medications Reconciled   Social History 07-10-1985, Santiago Glad; Alcohol use   Occasional alcohol use. Caffeine use   Carbonated beverages, Coffee, Tea. No drug use   Tobacco use   Never smoker.  Family History [place], Santiago Glad; Alcohol Abuse   Father, Mother. Bleeding disorder   Mother. Cancer   Father. Cerebrovascular Accident   Mother. Colon Polyps   Father. Depression   Mother. Diabetes Mellitus   Father, Mother. Heart Disease   Father, Mother. Heart disease in male family member before age 35   Respiratory Condition   Mother.  Other Problems 76, CMA; Anxiety Disorder   Asthma   Back Pain   Chest pain   High blood pressure   Hypercholesterolemia   Inguinal Hernia   Kidney Stone   Migraine Headache    Thyroid Disease      Review of Systems  General Present- Fatigue and Weight Gain. Not Present- Appetite Loss, Chills, Fever, Night Sweats and Weight Loss. Skin Not Present- Change in Wart/Mole, Dryness, Hives, Jaundice, New Lesions, Non-Healing Wounds, Rash and Ulcer. HEENT Present- Ringing in the Ears, Seasonal Allergies and Wears glasses/contact lenses. Not Present- Earache, Hearing Loss, Hoarseness, Nose Bleed, Oral Ulcers, Sinus Pain, Sore Throat, Visual Disturbances and Yellow Eyes. Respiratory Present- Chronic Cough and Snoring. Not Present- Bloody sputum, Difficulty Breathing and Wheezing. Male Genitourinary Present- Frequency and Nocturia. Not Present- Blood in Urine, Change in Urinary Stream, Impotence, Painful Urination, Urgency and Urine Leakage. Musculoskeletal Present- Back Pain, Joint Pain and Joint Stiffness. Not Present- Muscle Pain, Muscle Weakness and Swelling of Extremities. Neurological Present- Headaches, Numbness and Tingling. Not Present- Decreased Memory, Fainting, Seizures, Tremor, Trouble walking and Weakness. Hematology Present- Gland problems. Not Present- Blood Thinners, Easy Bruising, Excessive bleeding, HIV and Persistent Infections.  Vitals   Weight: 387.6 lb   Height: 72 in  Body Surface Area: 2.82 m   Body Mass Index: 52.57 kg/m   Temp.: 98 F    Pulse: 96 (Regular)    BP: 136/88(Sitting, Left Arm, Standard)     Physical Exam  The physical exam findings are as follows: Note:  On physical examination he is in no acute distress.  His abdomen is soft and obese. There is a small, easily reducible, minimally tender left inguinal hernia. There is no evidence of umbilical hernia or right inguinal hernia.  Lungs clear  CV RRR  Skin  without rashes  Neuro grossly intact    Assessment & Plan   LEFT INGUINAL HERNIA (K40.90)  Impression: I reviewed his notes in the electronic medical records including the notes from his primary care provider and  cardiology. He does have a left inguinal hernia. I discussed abdominal wall anatomy with him. As he is symptomatic, repair of the hernias recommended. I discussed reasons for this with him in detail including the risks of incarceration without surgery. We next discussed both the laparoscopic and open techniques. Given his obesity as well as a short distance from his umbilicus to pubis, I believe he would better be certain with an open left inguinal hernia repair with mesh versus laparoscopic. I discussed this with him as well. We discussed the surgical procedure. We discussed the risks which includes but is not limited to bleeding, infection, injury to surrounding structures, nerve entrapment, chronic pain, the use of mesh, hernia recurrence, postoperative recovery, etc. He understands and wishes to proceed with surgery which will be scheduled.

## 2020-12-07 NOTE — Anesthesia Preprocedure Evaluation (Addendum)
Anesthesia Evaluation  Patient identified by MRN, date of birth, ID band Patient awake    Reviewed: Allergy & Precautions, NPO status , Patient's Chart, lab work & pertinent test results  Airway Mallampati: III  TM Distance: >3 FB Neck ROM: Full    Dental  (+) Teeth Intact   Pulmonary    breath sounds clear to auscultation       Cardiovascular hypertension,  Rhythm:Regular Rate:Normal     Neuro/Psych    GI/Hepatic   Endo/Other    Renal/GU      Musculoskeletal   Abdominal (+) + obese,   Peds  Hematology   Anesthesia Other Findings   Reproductive/Obstetrics                            Anesthesia Physical Anesthesia Plan  ASA: 3  Anesthesia Plan: General   Post-op Pain Management:    Induction: Intravenous  PONV Risk Score and Plan: Ondansetron, Dexamethasone and Propofol infusion  Airway Management Planned: Oral ETT and Video Laryngoscope Planned  Additional Equipment:   Intra-op Plan:   Post-operative Plan: Extubation in OR  Informed Consent: I have reviewed the patients History and Physical, chart, labs and discussed the procedure including the risks, benefits and alternatives for the proposed anesthesia with the patient or authorized representative who has indicated his/her understanding and acceptance.     Dental advisory given  Plan Discussed with: CRNA and Anesthesiologist  Anesthesia Plan Comments: (Hypertension Obesity H/O post-op nausea and vomiting  Asthma- controlled  Plan GA with glide scope intubation TAP block Propofol infusion  Kipp Brood)      Anesthesia Quick Evaluation

## 2020-12-07 NOTE — Progress Notes (Signed)
Anesthesia Chart Review:   Case: [id number] Date/Time: 12/08/20 1045   Procedure: OPEN HERNIA REPAIR INGUINAL ADULT WITH MESH (Left) - TAP BLOCK AND LMA.   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 Evaluated with gated cardiac CTA that showed no evidence of CAD   LABS: Will be obtained day of surgery    IMAGES: CT coronary morphology over-read 05/30/20:  1. No acute findings in the imaged extracardiac chest. 2. Pulmonary artery enlargement suggests pulmonary arterial hypertension. 3. Distal esophageal wall thickening, suggesting esophagitis. 4. Mild hepatic steatosis. 5. Aortic Atherosclerosis   EKG 09/07/20: NSR   CV: CT coronary morphology 05/30/20:  1. Poor image quality due to noise artifact from obesity and sub-optimal vasodilation reduce the sensitivity of this study to detect plaque.  2. There is no definite coronary artery disease in the left main coronary artery or proximal LAD, RCA or L circumflex artery. CADRADS= 0. No obstructive coronary artery lesions. 3.  Coronary calcium score of 0. 4.  Normal coronary origin with right dominance. 5.  Moderately dilated main pulmonary artery, 38 mm. 6.  Mildly dilated mid ascending aorta, 40 mm.   Past Medical History:  Diagnosis Date   Asthma    Hyperlipidemia    diet controlled - no meds   Hypertension    Migraine    PONV (postoperative nausea and vomiting)    Vitamin D deficiency     Past Surgical History:  Procedure Laterality Date   KNEE SURGERY     tubes in ears     WISDOM TOOTH EXTRACTION      MEDICATIONS: No current  facility-administered medications for this encounter.    Azilsartan-Chlorthalidone (EDARBYCLOR) 40-12.5 MG TABS   Cholecalciferol 1.25 MG (50000 UT) capsule   Fluticasone-Umeclidin-Vilant (TRELEGY ELLIPTA) 200-62.5-25 MCG/INH AEPB   montelukast (SINGULAIR) 10 MG tablet    If labs acceptable day of surgery, I anticipate pt can proceed with surgery as scheduled.  Rica Mast, PhD, FNP-BC [facility]/Anesthesiology Phone: [phone] 12/07/2020 10:49 AM

## 2020-12-07 NOTE — Progress Notes (Signed)
DUE TO COVID-19 ONLY ONE VISITOR IS ALLOWED TO COME WITH YOU AND STAY IN THE WAITING ROOM ONLY DURING PRE OP AND PROCEDURE DAY OF SURGERY.   Patient denies shortness of breath, fever, cough or chest pain.  PCP - Dr Sanda Linger Cardiologist - Dr Clifton James (Saw once)  Chest x-ray - n/a EKG - 09/07/20 Stress Test - n/a ECHO - n/a Cardiac Cath - n/a  Sleep Study -  n/a (test scheduled on 01/04/21) CPAP - none  ERAS: Clear liquids til 8 am on DOS.  Anesthesia review: Yes  STOP now taking any Aspirin (unless otherwise instructed by your surgeon), Aleve, Naproxen, Ibuprofen, Motrin, Advil, Goody's, BC's, all herbal medications, fish oil, and all vitamins.   Coronavirus Screening Covid test on 12/05/20 was negative.  Do you have any of the following symptoms:  Cough yes/no: No Fever (>100.64F)  yes/no: No Runny nose yes/no: No Sore throat yes/no: No Difficulty breathing/shortness of breath  yes/no: No  Have you traveled in the last 14 days and where? yes/no: No  Patient verbalized understanding of instructions that were given via phone.

## 2020-12-08 ENCOUNTER — Ambulatory Visit (HOSPITAL_COMMUNITY)
Admission: RE | Admit: 2020-12-08 | Discharge: 2020-12-08 | Disposition: A | Discharge status: Home / Self Care | Payer: 59 | Attending: Surgery | Admitting: Surgery

## 2020-12-08 ENCOUNTER — Encounter (HOSPITAL_COMMUNITY)
Admission: RE | Disposition: A | Discharge status: Home / Self Care | Payer: Self-pay | Source: Home / Self Care | Attending: Surgery

## 2020-12-08 ENCOUNTER — Ambulatory Visit (HOSPITAL_COMMUNITY): Payer: 59 | Admitting: Emergency Medicine

## 2020-12-08 ENCOUNTER — Encounter (HOSPITAL_COMMUNITY): Payer: Self-pay | Admitting: Surgery

## 2020-12-08 DIAGNOSIS — Z836 Family history of other diseases of the respiratory system: Secondary | ICD-10-CM | POA: Insufficient documentation

## 2020-12-08 DIAGNOSIS — Z809 Family history of malignant neoplasm, unspecified: Secondary | ICD-10-CM | POA: Insufficient documentation

## 2020-12-08 DIAGNOSIS — Z832 Family history of diseases of the blood and blood-forming organs and certain disorders involving the immune mechanism: Secondary | ICD-10-CM | POA: Insufficient documentation

## 2020-12-08 DIAGNOSIS — Z823 Family history of stroke: Secondary | ICD-10-CM | POA: Insufficient documentation

## 2020-12-08 DIAGNOSIS — K4090 Unilateral inguinal hernia, without obstruction or gangrene, not specified as recurrent: Secondary | ICD-10-CM | POA: Diagnosis present

## 2020-12-08 DIAGNOSIS — Z833 Family history of diabetes mellitus: Secondary | ICD-10-CM | POA: Diagnosis not present

## 2020-12-08 DIAGNOSIS — Z8249 Family history of ischemic heart disease and other diseases of the circulatory system: Secondary | ICD-10-CM | POA: Diagnosis not present

## 2020-12-08 DIAGNOSIS — Z79899 Other long term (current) drug therapy: Secondary | ICD-10-CM | POA: Insufficient documentation

## 2020-12-08 HISTORY — DX: Other specified postprocedural states: Z98.890

## 2020-12-08 HISTORY — PX: INSERTION OF MESH: SHX5868

## 2020-12-08 HISTORY — PX: INGUINAL HERNIA REPAIR: SHX194

## 2020-12-08 HISTORY — DX: Nausea with vomiting, unspecified: R11.2

## 2020-12-08 HISTORY — DX: Vitamin D deficiency, unspecified: E55.9

## 2020-12-08 LAB — CBC
HCT: 43.9 % (ref 39.0–52.0)
Hemoglobin: 14.4 g/dL (ref 13.0–17.0)
MCH: 26.7 pg (ref 26.0–34.0)
MCHC: 32.8 g/dL (ref 30.0–36.0)
MCV: 81.4 fL (ref 80.0–100.0)
Platelets: 352 10*3/uL (ref 150–400)
RBC: 5.39 MIL/uL (ref 4.22–5.81)
RDW: 14.7 % (ref 11.5–15.5)
WBC: 7 10*3/uL (ref 4.0–10.5)
nRBC: 0 % (ref 0.0–0.2)

## 2020-12-08 LAB — BASIC METABOLIC PANEL WITH GFR
Anion gap: 10 (ref 5–15)
BUN: 14 mg/dL (ref 6–20)
CO2: 20 mmol/L — ABNORMAL LOW (ref 22–32)
Calcium: 8.6 mg/dL — ABNORMAL LOW (ref 8.9–10.3)
Chloride: 100 mmol/L (ref 98–111)
Creatinine, Ser: 0.83 mg/dL (ref 0.61–1.24)
GFR, Estimated: >60 mL/min
Glucose, Bld: 106 mg/dL — ABNORMAL HIGH (ref 70–99)
Potassium: >7.5 mmol/L (ref 3.5–5.1)
Sodium: 130 mmol/L — ABNORMAL LOW (ref 135–145)

## 2020-12-08 SURGERY — REPAIR, HERNIA, INGUINAL, ADULT
Anesthesia: General | Site: Inguinal | Laterality: Left

## 2020-12-08 MED ORDER — 0.9 % SODIUM CHLORIDE (POUR BTL) OPTIME
TOPICAL | Status: DC | PRN
  Administered 2020-12-08: 1000 mL

## 2020-12-08 MED ORDER — MIDAZOLAM HCL 2 MG/2ML IJ SOLN
2.0000 mg | Freq: Once | INTRAMUSCULAR | Status: AC
  Filled 2020-12-08: qty 2

## 2020-12-08 MED ORDER — OXYCODONE HCL 5 MG PO TABS: 5.0000 mg | ORAL_TABLET | Freq: Four times a day (QID) | ORAL | 0 refills | Status: DC | PRN

## 2020-12-08 MED ORDER — ENSURE PRE-SURGERY PO LIQD: 296.0000 mL | Freq: Once | ORAL | Status: DC

## 2020-12-08 MED ORDER — LACTATED RINGERS IV SOLN: INTRAVENOUS | Status: DC

## 2020-12-08 MED ORDER — FENTANYL CITRATE (PF) 100 MCG/2ML IJ SOLN
100.0000 ug | Freq: Once | INTRAMUSCULAR | Status: AC
Start: 2020-12-08 — End: 2020-12-08
  Filled 2020-12-08: qty 2

## 2020-12-08 MED ORDER — CHLORHEXIDINE GLUCONATE CLOTH 2 % EX PADS: 6.0000 | MEDICATED_PAD | Freq: Once | CUTANEOUS | Status: DC

## 2020-12-08 MED ORDER — MIDAZOLAM HCL 2 MG/2ML IJ SOLN
INTRAMUSCULAR | Status: AC
  Filled 2020-12-08: qty 2

## 2020-12-08 MED ORDER — DEXAMETHASONE SODIUM PHOSPHATE 10 MG/ML IJ SOLN
INTRAMUSCULAR | Status: DC | PRN
  Administered 2020-12-08: 5 mg via INTRAVENOUS

## 2020-12-08 MED ORDER — DEXMEDETOMIDINE HCL IN NACL 80 MCG/20ML IV SOLN
INTRAVENOUS | Status: AC
  Filled 2020-12-08: qty 20

## 2020-12-08 MED ORDER — BUPIVACAINE-EPINEPHRINE 0.5% -1:200000 IJ SOLN
INTRAMUSCULAR | Status: AC
  Filled 2020-12-08: qty 1

## 2020-12-08 MED ORDER — OXYCODONE HCL 5 MG PO TABS: 5.0000 mg | ORAL_TABLET | Freq: Once | ORAL | Status: DC | PRN

## 2020-12-08 MED ORDER — ONDANSETRON HCL 4 MG/2ML IJ SOLN
INTRAMUSCULAR | Status: AC
  Filled 2020-12-08: qty 2

## 2020-12-08 MED ORDER — MIDAZOLAM HCL 2 MG/2ML IJ SOLN
1.0000 mg | Freq: Once | INTRAMUSCULAR | Status: AC
  Administered 2020-12-08: 1 mg via INTRAVENOUS

## 2020-12-08 MED ORDER — FENTANYL CITRATE (PF) 250 MCG/5ML IJ SOLN
INTRAMUSCULAR | Status: AC
  Filled 2020-12-08: qty 5

## 2020-12-08 MED ORDER — CEFAZOLIN IN SODIUM CHLORIDE 3-0.9 GM/100ML-% IV SOLN
INTRAVENOUS | Status: AC
  Filled 2020-12-08: qty 100

## 2020-12-08 MED ORDER — FENTANYL CITRATE (PF) 100 MCG/2ML IJ SOLN
25.0000 ug | INTRAMUSCULAR | Status: DC | PRN
  Administered 2020-12-08: 50 ug via INTRAVENOUS

## 2020-12-08 MED ORDER — ONDANSETRON HCL 4 MG/2ML IJ SOLN
INTRAMUSCULAR | Status: DC | PRN
  Administered 2020-12-08: 4 mg via INTRAVENOUS

## 2020-12-08 MED ORDER — FENTANYL CITRATE (PF) 100 MCG/2ML IJ SOLN: 50.0000 ug | Freq: Once | INTRAMUSCULAR | Status: DC

## 2020-12-08 MED ORDER — ORAL CARE MOUTH RINSE: 15.0000 mL | Freq: Once | OROMUCOSAL | Status: AC

## 2020-12-08 MED ORDER — FENTANYL CITRATE (PF) 250 MCG/5ML IJ SOLN
INTRAMUSCULAR | Status: DC | PRN
  Administered 2020-12-08: 50 ug via INTRAVENOUS
  Administered 2020-12-08: 100 ug via INTRAVENOUS
  Administered 2020-12-08 (×2): 25 ug via INTRAVENOUS
  Administered 2020-12-08: 50 ug via INTRAVENOUS

## 2020-12-08 MED ORDER — FENTANYL CITRATE (PF) 100 MCG/2ML IJ SOLN
INTRAMUSCULAR | Status: AC
  Administered 2020-12-08: 100 ug via INTRAVENOUS
  Filled 2020-12-08: qty 2

## 2020-12-08 MED ORDER — MIDAZOLAM HCL 2 MG/2ML IJ SOLN
INTRAMUSCULAR | Status: AC
  Administered 2020-12-08: 2 mg via INTRAVENOUS
  Filled 2020-12-08: qty 2

## 2020-12-08 MED ORDER — BUPIVACAINE-EPINEPHRINE 0.5% -1:200000 IJ SOLN
INTRAMUSCULAR | Status: DC | PRN
  Administered 2020-12-08: 10 mL

## 2020-12-08 MED ORDER — OXYCODONE HCL 5 MG/5ML PO SOLN: 5.0000 mg | Freq: Once | ORAL | Status: DC | PRN

## 2020-12-08 MED ORDER — PROPOFOL 10 MG/ML IV BOLUS
INTRAVENOUS | Status: AC
  Filled 2020-12-08: qty 40

## 2020-12-08 MED ORDER — ACETAMINOPHEN 500 MG PO TABS
ORAL_TABLET | ORAL | Status: AC
  Administered 2020-12-08: 1000 mg via ORAL
  Filled 2020-12-08: qty 2

## 2020-12-08 MED ORDER — LIDOCAINE 2% (20 MG/ML) 5 ML SYRINGE
INTRAMUSCULAR | Status: AC
  Filled 2020-12-08: qty 5

## 2020-12-08 MED ORDER — PROPOFOL 10 MG/ML IV BOLUS
INTRAVENOUS | Status: DC | PRN
  Administered 2020-12-08: 200 mg via INTRAVENOUS
  Administered 2020-12-08: 100 mg via INTRAVENOUS

## 2020-12-08 MED ORDER — ACETAMINOPHEN 500 MG PO TABS: 1000.0000 mg | ORAL_TABLET | ORAL | Status: AC

## 2020-12-08 MED ORDER — LIDOCAINE 2% (20 MG/ML) 5 ML SYRINGE
INTRAMUSCULAR | Status: DC | PRN
  Administered 2020-12-08: 40 mg via INTRAVENOUS

## 2020-12-08 MED ORDER — SUGAMMADEX SODIUM 500 MG/5ML IV SOLN
INTRAVENOUS | Status: DC | PRN
  Administered 2020-12-08: 350 mg via INTRAVENOUS

## 2020-12-08 MED ORDER — FENTANYL CITRATE (PF) 100 MCG/2ML IJ SOLN
INTRAMUSCULAR | Status: AC
  Filled 2020-12-08: qty 2

## 2020-12-08 MED ORDER — ROCURONIUM BROMIDE 10 MG/ML (PF) SYRINGE
PREFILLED_SYRINGE | INTRAVENOUS | Status: DC | PRN
  Administered 2020-12-08: 60 mg via INTRAVENOUS

## 2020-12-08 MED ORDER — CHLORHEXIDINE GLUCONATE 0.12 % MT SOLN
OROMUCOSAL | Status: AC
  Administered 2020-12-08: 15 mL via OROMUCOSAL
  Filled 2020-12-08: qty 15

## 2020-12-08 MED ORDER — CHLORHEXIDINE GLUCONATE 0.12 % MT SOLN: 15.0000 mL | Freq: Once | OROMUCOSAL | Status: AC

## 2020-12-08 MED ORDER — ONDANSETRON HCL 4 MG/2ML IJ SOLN
4.0000 mg | Freq: Once | INTRAMUSCULAR | Status: AC | PRN
  Administered 2020-12-08: 4 mg via INTRAVENOUS

## 2020-12-08 MED ORDER — CEFAZOLIN IN SODIUM CHLORIDE 3-0.9 GM/100ML-% IV SOLN
3.0000 g | INTRAVENOUS | Status: AC
  Administered 2020-12-08: 3 g via INTRAVENOUS

## 2020-12-08 SURGICAL SUPPLY — 24 items
BLADE CLIPPER SENSICLIP SURGIC (BLADE) ×2 IMPLANT
BLADE SURG 10 STRL SS (BLADE) ×3 IMPLANT
BLADE SURG 15 STRL LF DISP TIS (BLADE) ×2 IMPLANT
BLADE SURG 15 STRL SS (BLADE) ×3
CHLORAPREP W/TINT 26 (MISCELLANEOUS) ×3 IMPLANT
DERMABOND ADVANCED (GAUZE/BANDAGES/DRESSINGS) ×1
DERMABOND ADVANCED .7 DNX12 (GAUZE/BANDAGES/DRESSINGS) ×2 IMPLANT
DRAIN PENROSE 0.5X18 (DRAIN) ×2 IMPLANT
DRAPE LAPAROTOMY TRNSV 102X78 (DRAPES) ×3 IMPLANT
ELECT REM PT RETURN 9FT ADLT (ELECTROSURGICAL) ×3
ELECTRODE REM PT RTRN 9FT ADLT (ELECTROSURGICAL) ×2 IMPLANT
GOWN STRL REUS W/TWL LRG LVL3 (GOWN DISPOSABLE) ×3 IMPLANT
MESH PARIETEX PROGRIP LEFT (Mesh General) ×2 IMPLANT
PENCIL SMOKE EVACUATOR (MISCELLANEOUS) ×3 IMPLANT
SPONGE T-LAP 18X18 ~~LOC~~+RFID (SPONGE) ×4 IMPLANT
SPONGE T-LAP 4X18 ~~LOC~~+RFID (SPONGE) ×3 IMPLANT
SUT MNCRL AB 4-0 PS2 18 (SUTURE) ×3 IMPLANT
SUT SILK 2 0 SH (SUTURE) ×2 IMPLANT
SUT VIC AB 2-0 CT1 27 (SUTURE) ×6
SUT VIC AB 2-0 CT1 TAPERPNT 27 (SUTURE) ×3 IMPLANT
SUT VIC AB 3-0 CT1 27 (SUTURE) ×3
SUT VIC AB 3-0 CT1 TAPERPNT 27 (SUTURE) ×2 IMPLANT
SYR CONTROL 10ML LL (SYRINGE) ×3 IMPLANT
TOWEL OR 17X26 10 PK STRL BLUE (TOWEL DISPOSABLE) ×3 IMPLANT

## 2020-12-08 NOTE — Anesthesia Procedure Notes (Addendum)
Anesthesia Regional Block: TAP block   Pre-Anesthetic Checklist: , timeout performed,  Correct Patient, Correct Site, Correct Laterality,  Correct Procedure, Correct Position, site marked,  Risks and benefits discussed,  Pre-op evaluation,  At surgeon's request and post-op pain management  Laterality: Left  Prep: Maximum Sterile Barrier Precautions used, chloraprep       Needles:  Injection technique: Single-shot  Needle Type: Echogenic Stimulator Needle     Needle Length: 9cm  Needle Gauge: 21     Additional Needles:   Narrative:  Start time: 12/08/2020 10:25 AM End time: 12/08/2020 10:30 AM Injection made incrementally with aspirations every 5 mL. Anesthesiologist: Kipp Brood, MD  Additional Notes: 30 cc 0.25% Bupivacaine with 1:200 epi

## 2020-12-08 NOTE — Transfer of Care (Signed)
Immediate Anesthesia Transfer of Care Note  Patient: Frederick Snyder  Procedure(s) Performed: OPEN HERNIA REPAIR INGUINAL ADULT WITH MESH (Left: Inguinal)  Patient Location: PACU  Anesthesia Type:General and Regional  Level of Consciousness: awake and alert   Airway & Oxygen Therapy: Patient Spontanous Breathing and Patient connected to nasal cannula oxygen  Post-op Assessment: Report given to RN and Post -op Vital signs reviewed and stable  Post vital signs: Reviewed and stable  Last Vitals:  Vitals Value Taken Time  BP 126/64 12/08/20 1205  Temp    Pulse 97 12/08/20 1206  Resp 13 12/08/20 1206  SpO2 97 % 12/08/20 1206  Vitals shown include unvalidated device data.  Last Pain:  Vitals:   12/08/20 1033  TempSrc:   PainSc: 0-No pain         Complications: No notable events documented.

## 2020-12-08 NOTE — Discharge Instructions (Signed)
[facility] _______[facility]  UMBILICAL OR INGUINAL HERNIA REPAIR: POST OP INSTRUCTIONS  Always review your discharge instruction sheet given to you by the facility where your surgery was performed. IF YOU HAVE DISABILITY OR FAMILY LEAVE FORMS, YOU MUST BRING THEM TO THE OFFICE FOR PROCESSING.   DO NOT GIVE THEM TO YOUR DOCTOR.  1. A  prescription for pain medication may be given to you upon discharge.  Take your pain medication as prescribed, if needed.  If narcotic pain medicine is not needed, then you may take acetaminophen (Tylenol) or ibuprofen (Advil) as needed. 2. Take your usually prescribed medications unless otherwise directed. If you need a refill on your pain medication, please contact your pharmacy.  They will contact our office to request authorization. Prescriptions will not be filled after 5 pm or on week-ends. 3. You should follow a light diet the first 24 hours after arrival home, such as soup and crackers, etc.  Be sure to include lots of fluids daily.  Resume your normal diet the day after surgery. 4.Most patients will experience some swelling and bruising around the umbilicus or in the groin and scrotum.  Ice packs and reclining will help.  Swelling and bruising can take several days to resolve.  6. It is common to experience some constipation if taking pain medication after surgery.  Increasing fluid intake and taking a stool softener (such as Colace) will usually help or prevent this problem from occurring.  A mild laxative (Milk of Magnesia or Miralax) should be taken according to package directions if there are no bowel movements after 48 hours. 7. Unless discharge instructions indicate otherwise, you may remove your bandages 24-48 hours after surgery, and you may shower at that time.  You may have steri-strips (small skin tapes) in place directly over the incision.  These strips should be left on the skin for 7-10 days.  If your surgeon used skin glue on the  incision, you may shower in 24 hours.  The glue will flake off over the next 2-3 weeks.  Any sutures or staples will be removed at the office during your follow-up visit. 8. ACTIVITIES:  You may resume regular (light) daily activities beginning the next day--such as daily self-care, walking, climbing stairs--gradually increasing activities as tolerated.  You may have sexual intercourse when it is comfortable.  Refrain from any heavy lifting or straining until approved by your doctor.  a.You may drive when you are no longer taking prescription pain medication, you can comfortably wear a seatbelt, and you can safely maneuver your car and apply brakes. b.RETURN TO WORK:   _____________________________________________  9.You should see your doctor in the office for a follow-up appointment approximately 2-3 weeks after your surgery.  Make sure that you call for this appointment within a day or two after you arrive home to insure a convenient appointment time. 10.OTHER INSTRUCTIONS: __OK TO SHOWER STARTING TOMORROW ICE PACK, TYLENOL, AND IBUPROFEN ALSO FOR PAIN NO LIFTING MORE THAN 15 TO 20 POUNDS FOR 4-6 WEEKS_______________________    _____________________________________  WHEN TO CALL YOUR DOCTOR: Fever over 101.0 Inability to urinate Nausea and/or vomiting Extreme swelling or bruising Continued bleeding from incision. Increased pain, redness, or drainage from the incision  The clinic staff is available to answer your questions during regular business hours.  Please don't hesitate to call and ask to speak to one of the nurses for clinical concerns.  If you have a medical emergency, go to the nearest emergency room or call  911.  A surgeon from [facility] is always on call at the hospital   [address], [address], [place], [place]  [address] ?  [address] [address], [place], [place]   [address] [phone] ? [phone] ? FAX [phone] Web site:  [email or web address]

## 2020-12-08 NOTE — Interval H&P Note (Signed)
History and Physical Interval Note: no change in H and P  12/08/2020 9:15 AM  Frederick Snyder  has presented today for surgery, with the diagnosis of left inguinal hernia.  The various methods of treatment have been discussed with the patient and family. After consideration of risks, benefits and other options for treatment, the patient has consented to  Procedure(s) with comments: OPEN HERNIA REPAIR INGUINAL ADULT WITH MESH (Left) - TAP BLOCK AND LMA. as a surgical intervention.  The patient's history has been reviewed, patient examined, no change in status, stable for surgery.  I have reviewed the patient's chart and labs.  Questions were answered to the patient's satisfaction.     Abigail Miyamoto

## 2020-12-08 NOTE — Anesthesia Procedure Notes (Addendum)
Procedure Name: Intubation Date/Time: 12/08/2020 11:00 AM Performed by: Nils Pyle, CRNA Pre-anesthesia Checklist: Patient identified, Emergency Drugs available, Suction available and Patient being monitored Patient Re-evaluated:Patient Re-evaluated prior to induction Oxygen Delivery Method: Circle System Utilized Preoxygenation: Pre-oxygenation with 100% oxygen Induction Type: IV induction Ventilation: Oral airway inserted - appropriate to patient size, Two handed mask ventilation required and Mask ventilation with difficulty Laryngoscope Size: Glidescope and 4 Grade View: Grade I Tube type: Oral Tube size: 7.5 mm Number of attempts: 1 Airway Equipment and Method: Stylet and Oral airway Placement Confirmation: ETT inserted through vocal cords under direct vision, positive ETCO2 and breath sounds checked- equal and bilateral Secured at: 21 cm Tube secured with: Tape Dental Injury: Teeth and Oropharynx as per pre-operative assessment  Comments: Placed by Stann Ore, SRNA

## 2020-12-08 NOTE — Op Note (Signed)
OPEN HERNIA REPAIR INGUINAL ADULT WITH MESH  Procedure Note  Frederick Snyder 12/08/2020   Pre-op Diagnosis: left inguinal hernia     Post-op Diagnosis: same  Procedure(s): OPEN HERNIA REPAIR INGUINAL ADULT WITH MESH  Surgeon(s): Abigail Miyamoto, MD  Anesthesia: General  Staff:  Circulator: Rogers Seeds, RN Relief Circulator: Marcy Panning, RN Relief Scrub: Iantha Fallen, RN Scrub Person: Audie Pinto D, RN  Estimated Blood Loss: Minimal               Findings: The patient was found to have a direct left inguinal hernia without evidence of indirect hernia.  It was repaired with a piece of large Prolene ProGrip mesh from [employer]  Procedure: The patient is brought to operating identifies correct patient.  He is placed upon the operating table general anesthesia was induced.  His abdomen was then prepped and draped in usual sterile fashion.  I anesthetized the skin in the left inguinal area with Marcaine.  I then made a longitudinal incision with a scalpel.  I then dissected down through the thick large subcutaneous tissue with electrocautery.  I took this down through Scarpa's fascia and then identified the external bleak fascia.  Given his morbid obesity, I did make the incision larger in order to control the fatty tissue.  I then opened up the external bleak toward the internal/external rings.  The patient had a very weak inguinal floor with a direct hernia defect.  Identified the testicular cord and controlled with a Penrose drain.  There is no evidence of indirect hernia.  I then brought a piece of large ProGrip Prolene mesh onto the field.  I placed it against the pubic tubercle and then brought around the cord structures widely covering the inguinal floor.  I then sutured the mesh in place with several interrupted 2-0 Vicryl sutures to the shelving edge of the inguinal ligament, the pubic tubercle, and the transversalis.  I then remove the Penrose drain.  I then closed  external beak fascia over the top of the mesh with a running 2-0 Vicryl suture.  Scarpa's fascia then closed interrupted 3-0 Vicryl sutures and skin was closed with running 4-0 Monocryl.  Dermabond was then applied.  The patient tolerated the procedure well.  All the counts were correct at the end of the procedure.  The patient was then extubated in the operating room and taken in a stable condition to the recovery room.          Abigail Miyamoto   Date: 12/08/2020  Time: 11:56 AM

## 2020-12-09 ENCOUNTER — Encounter (HOSPITAL_COMMUNITY): Payer: Self-pay | Admitting: Surgery

## 2020-12-10 NOTE — Anesthesia Postprocedure Evaluation (Signed)
Anesthesia Post Note  Patient: GORGE ALMANZA  Procedure(s) Performed: OPEN HERNIA REPAIR INGUINAL ADULT (Left: Inguinal) INSERTION OF MESH (Left: Groin)     Patient location during evaluation: PACU Anesthesia Type: General Level of consciousness: awake and alert Pain management: pain level controlled Vital Signs Assessment: post-procedure vital signs reviewed and stable Respiratory status: spontaneous breathing, nonlabored ventilation, respiratory function stable and patient connected to nasal cannula oxygen Cardiovascular status: blood pressure returned to baseline and stable Postop Assessment: no apparent nausea or vomiting Anesthetic complications: no   No notable events documented.  Last Vitals:  Vitals:   12/08/20 1250 12/08/20 1305  BP: 113/81 107/68  Pulse: 89 85  Resp: (!) 9 11  Temp:  36.7 C  SpO2: 97% 97%    Last Pain:  Vitals:   12/08/20 1305  TempSrc:   PainSc: Asleep                 Yaremi Stahlman COKER

## 2020-12-13 ENCOUNTER — Encounter: Payer: Self-pay | Admitting: Internal Medicine

## 2020-12-14 ENCOUNTER — Other Ambulatory Visit: Payer: Self-pay | Admitting: Internal Medicine

## 2020-12-14 DIAGNOSIS — B370 Candidal stomatitis: Secondary | ICD-10-CM | POA: Insufficient documentation

## 2020-12-14 MED ORDER — FLUCONAZOLE 150 MG PO TABS: 150.0000 mg | ORAL_TABLET | Freq: Every day | ORAL | 0 refills | Status: AC

## 2020-12-29 NOTE — Addendum Note (Signed)
Addendum  created 12/29/20 1118 by Marcene Duos, MD   Clinical Note Signed, Intraprocedure Blocks edited, SmartForm saved

## 2021-01-04 ENCOUNTER — Other Ambulatory Visit: Payer: Self-pay

## 2021-01-04 ENCOUNTER — Ambulatory Visit (INDEPENDENT_AMBULATORY_CARE_PROVIDER_SITE_OTHER): Payer: 59 | Admitting: Neurology

## 2021-01-04 DIAGNOSIS — G4719 Other hypersomnia: Secondary | ICD-10-CM

## 2021-01-04 DIAGNOSIS — G4733 Obstructive sleep apnea (adult) (pediatric): Secondary | ICD-10-CM

## 2021-01-04 DIAGNOSIS — R519 Headache, unspecified: Secondary | ICD-10-CM

## 2021-01-04 DIAGNOSIS — G4720 Circadian rhythm sleep disorder, unspecified type: Secondary | ICD-10-CM

## 2021-01-04 DIAGNOSIS — Z820 Family history of epilepsy and other diseases of the nervous system: Secondary | ICD-10-CM

## 2021-01-04 DIAGNOSIS — R351 Nocturia: Secondary | ICD-10-CM

## 2021-01-04 DIAGNOSIS — Z6841 Body Mass Index (BMI) 40.0 and over, adult: Secondary | ICD-10-CM

## 2021-01-04 DIAGNOSIS — R0681 Apnea, not elsewhere classified: Secondary | ICD-10-CM

## 2021-01-04 DIAGNOSIS — R030 Elevated blood-pressure reading, without diagnosis of hypertension: Secondary | ICD-10-CM

## 2021-01-04 DIAGNOSIS — R0683 Snoring: Secondary | ICD-10-CM

## 2021-01-06 ENCOUNTER — Other Ambulatory Visit: Payer: Self-pay | Admitting: Internal Medicine

## 2021-01-06 DIAGNOSIS — I10 Essential (primary) hypertension: Secondary | ICD-10-CM

## 2021-01-09 ENCOUNTER — Other Ambulatory Visit: Payer: Self-pay | Admitting: Internal Medicine

## 2021-01-09 DIAGNOSIS — J4541 Moderate persistent asthma with (acute) exacerbation: Secondary | ICD-10-CM

## 2021-01-12 NOTE — Addendum Note (Signed)
Addended by: [name] on: 01/12/2021 06:24 PM   Modules accepted: Orders

## 2021-01-12 NOTE — Procedures (Signed)
PATIENT'S NAME:  Frederick, Snyder DOB:      26-Aug-1985      MR#:    [id number]     DATE OF RECORDING: 01/04/2021 REFERRING M.D.:  Sanda Linger, MD Study Performed:  Split-Night Titration Study  HISTORY: 35 year old man with a history of asthma, migraine headaches, vitamin D deficiency, suboptimally controlled hypertension, and morbid obesity with a BMI of over 50, who reports snoring and excessive daytime somnolence.  The patient endorsed the Epworth Sleepiness Scale at 9 points. The patient's weight 392 pounds with a height of 72 (inches), resulting in a BMI of 53.2 kg/m2. The patient's neck circumference measured 18-7/8 inches.  CURRENT MEDICATIONS: Cholecalciferol, Singulair, Trelegy Ellipta, Edarbyclor   PROCEDURE:  This is a multichannel digital polysomnogram utilizing the Somnostar 11.2 system.  Electrodes and sensors were applied and monitored per AASM Specifications.   EEG, EOG, Chin and Limb EMG, were sampled at 200 Hz.  ECG, Snore and Nasal Pressure, Thermal Airflow, Respiratory Effort, CPAP Flow and Pressure, Oximetry was sampled at 50 Hz. Digital video and audio were recorded.      BASELINE STUDY WITHOUT CPAP RESULTS:  Lights Out was at 21:03 and Lights On at 05:22 for the night, patient fulfilled emergency split criteria and was started on positive airway pressure treatment at 00:38, epoch 439.  Total recording time (TRT) was 219, with a total sleep time (TST) of 138 minutes.   The patient's sleep latency was 29 minutes.  REM sleep was absent prior to CPAP initiation. The sleep efficiency was 63. %.    SLEEP ARCHITECTURE: WASO (Wake after sleep onset) was 18 minutes, Stage N1 was 64.5 minutes, Stage N2 was 62.5 minutes, Stage N3 was 11 minutes and Stage R (REM sleep) was 0 minutes.  The percentages were Stage N1 46.7%, which is markedly increased, stage N2 45.3%, Stage N3 8.% and Stage R was absent.    RESPIRATORY ANALYSIS:  There were a total of 234 respiratory events:  18  obstructive apneas, 0 central apneas and 0 mixed apneas with a total of 18 apneas and an apnea index (AI) of 7.8. There were 216 hypopneas with a hypopnea index of 93.9. The patient also had 0 respiratory event related arousals (RERAs).      The total APNEA/HYPOPNEA INDEX (AHI) was 101.7/hour and the total RESPIRATORY DISTURBANCE INDEX was 101.7 /hour.  0 events occurred in REM sleep and 432 events in NREM. The REM AHI was n/a versus a non-REM AHI of 101.7 /hour. The patient spent 13 minutes sleep time in the supine position 358 minutes in non-supine. The supine AHI was 0.0 /hour versus a non-supine AHI of 101.7 /hour.  OXYGEN SATURATION & C02:  The wake baseline 02 saturation was 90%, with the lowest being 81%. Time spent below 89% saturation equaled 72 minutes.  PERIODIC LIMB MOVEMENTS: The patient had a total of 0 Periodic Limb Movements.  The Periodic Limb Movement (PLM) index was 0 /hour and the PLM Arousal index was 0 /hour.   Audio and video analysis did not show any abnormal or unusual movements, behaviors, phonations or vocalizations.  The patient took 1 bathroom break for the night.  Moderate to loud snoring was noted.  The EKG was in keeping with normal sinus rhythm (NSR).  TITRATION STUDY WITH CPAP RESULTS:   The patient was fitted with a medium Simplus fullface mask from [employer].  CPAP was initiated at 5 cm H20 with heated humidity per AASM split night standards and pressure was advanced to  11 cm H20 because of hypopneas, apneas and desaturations.  At a PAP pressure of 11 cmH20, there was a reduction of the AHI to 4.3/h, O2 nadir 87% with brief supine NREM sleep achieved.   Total recording time (TRT) was 280 minutes, with a total sleep time (TST) of 233 minutes. The patient's sleep latency was 20.5 minutes. REM latency was 96 minutes.  The sleep efficiency was 83.2 %.    SLEEP ARCHITECTURE: Wake after sleep was 32.5 minutes, Stage N1 35.5 minutes, Stage N2 155.5 minutes, Stage N3 0  minutes and Stage R (REM sleep) 42 minutes. The percentages were: Stage N1 15.2%, Stage N2 66.7%, Stage N3 0% and Stage R (REM sleep) 18.%. The arousals were noted as: 24 were spontaneous, 0 were associated with PLMs, 2 were associated with respiratory events.  RESPIRATORY ANALYSIS:  There were a total of 36 respiratory events: 0 obstructive apneas, 1 central apneas and 0 mixed apneas with a total of 1 apneas and an apnea index (AI) of .3. There were 35 hypopneas with a hypopnea index of 9. /hour. The patient also had 0 respiratory event related arousals (RERAs).      The total APNEA/HYPOPNEA INDEX  (AHI) was 9.3 /hour and the total RESPIRATORY DISTURBANCE INDEX was 9.3 /hour.  1 events occurred in REM sleep and 35 events in NREM. The REM AHI was 1.4 /hour versus a non-REM AHI of 11. /hour. REM sleep was achieved on a pressure of  cm/h2o (AHI was  .) The patient spent 6% of total sleep time in the supine position. The supine AHI was 23.1 /hour, versus a non-supine AHI of 8.5/hour.  OXYGEN SATURATION & C02:  The wake baseline 02 saturation was 96%, with the lowest being 85%. Time spent below 89% saturation equaled 3 minutes.  PERIODIC LIMB MOVEMENTS:  The patient had a total of 38 Periodic Limb Movements. The Periodic Limb Movement (PLM) index was 9.8 /hour and the PLM Arousal index was 0 /hour.  Post-study, the patient indicated that sleep was better than usual.    POLYSOMNOGRAPHY IMPRESSION :   Severe obstructive sleep apnea (OSA)  Dysfunctions associated with sleep stages or arousals from sleep  RECOMMENDATIONS:  This patient has severe obstructive sleep apnea (the absence of REM sleep during the baseline portion of the study may underestimate his AHI and O2 nadir) and responded well on CPAP therapy.  Given the absence of supine REM sleep achieved on the final titration pressure of 11 cm and a brief desaturation nadir of 87%, I will recommend a home CPAP level of 12 cm.  The patient will be  advised to be fully compliant with PAP therapy to improve sleep related symptoms and decrease long term cardiovascular risks. Please note that untreated obstructive sleep apnea carries additional perioperative morbidity. Patients with significant obstructive sleep apnea should receive perioperative PAP therapy and the surgeons and particularly the anesthesiologist should be informed of the diagnosis and the severity of the sleep disordered breathing.  This study shows sleep fragmentation and abnormal sleep stage percentages; these are nonspecific findings and per se do not signify an intrinsic sleep disorder or a cause for the patient's sleep-related symptoms. Causes include (but are not limited to) the first night effect of the sleep study, circadian rhythm disturbances, medication effect or an underlying mood disorder or medical problem.  The patient should be cautioned not to drive, work at heights, or operate dangerous or heavy equipment when tired or sleepy. Review and reiteration of good sleep  hygiene measures should be pursued with any patient.  The patient will be seen in follow-up in the sleep clinic at [facility] for discussion of the test results, symptom and treatment compliance review, further management strategies, etc. The referring provider will be notified of the test results.  I certify that I have reviewed the entire raw data recording prior to the issuance of this report in accordance with the Standards of Accreditation of the American Academy of Sleep Medicine (AASM)   Castiel Lauricella,MD,PhD Diplomat, American Board of Neurology and Sleep Medicine (Neurology and Sleep Medicine)

## 2021-01-19 ENCOUNTER — Telehealth: Payer: Self-pay

## 2021-01-19 NOTE — Telephone Encounter (Signed)
I called pt. I advised pt that Dr. Frances Furbish reviewed their sleep study results and found that pt has severe osa. Dr. Frances Furbish recommends that pt start on CPAP for treatment. I reviewed PAP compliance expectations with the pt. Pt is agreeable to starting an CPAP. I advised pt that an order will be sent to a DME, [facility], and [facility] will call the pt within about one week after they file with the pt's insurance. [facility] will show the pt how to use the machine, fit for masks, and troubleshoot the CPAP if needed. Once the pt has started on his CPAP he will call back so we can schedule his 31-90 day f/u. Pt also understanding insurance require this f/u along with 4 hours a night for use. A letter with all of this information in it will be mailed to the pt as a reminder. I verified with the pt that the address we have on file is correct. Pt verbalized understanding of results. Pt had no questions at this time but was encouraged to call back if questions arise. I have sent the order to [facility] and have received confirmation that they have received the order.

## 2021-03-11 ENCOUNTER — Other Ambulatory Visit: Payer: Self-pay | Admitting: Internal Medicine

## 2021-03-11 DIAGNOSIS — J4541 Moderate persistent asthma with (acute) exacerbation: Secondary | ICD-10-CM

## 2021-03-29 ENCOUNTER — Other Ambulatory Visit: Payer: Self-pay | Admitting: Internal Medicine

## 2021-03-29 DIAGNOSIS — I10 Essential (primary) hypertension: Secondary | ICD-10-CM

## 2021-04-06 ENCOUNTER — Encounter: Payer: Self-pay | Admitting: Neurology

## 2021-04-06 ENCOUNTER — Other Ambulatory Visit: Payer: Self-pay | Admitting: Internal Medicine

## 2021-04-06 DIAGNOSIS — J4541 Moderate persistent asthma with (acute) exacerbation: Secondary | ICD-10-CM

## 2021-04-17 ENCOUNTER — Ambulatory Visit (INDEPENDENT_AMBULATORY_CARE_PROVIDER_SITE_OTHER): Payer: 59 | Admitting: Neurology

## 2021-05-03 ENCOUNTER — Encounter: Payer: Self-pay | Admitting: Internal Medicine

## 2021-05-03 ENCOUNTER — Other Ambulatory Visit: Payer: Self-pay

## 2021-05-03 ENCOUNTER — Ambulatory Visit (INDEPENDENT_AMBULATORY_CARE_PROVIDER_SITE_OTHER): Payer: 59 | Admitting: Internal Medicine

## 2021-05-03 VITALS — BP 128/86 | HR 94 | Temp 98.2°F | Ht 72.0 in | Wt >= 6400 oz

## 2021-05-03 DIAGNOSIS — E559 Vitamin D deficiency, unspecified: Secondary | ICD-10-CM

## 2021-05-03 DIAGNOSIS — I10 Essential (primary) hypertension: Secondary | ICD-10-CM

## 2021-05-03 DIAGNOSIS — Z6841 Body Mass Index (BMI) 40.0 and over, adult: Secondary | ICD-10-CM | POA: Diagnosis not present

## 2021-05-03 DIAGNOSIS — E781 Pure hyperglyceridemia: Secondary | ICD-10-CM

## 2021-05-03 DIAGNOSIS — Z23 Encounter for immunization: Secondary | ICD-10-CM | POA: Diagnosis not present

## 2021-05-03 DIAGNOSIS — E221 Hyperprolactinemia: Secondary | ICD-10-CM

## 2021-05-03 DIAGNOSIS — E291 Testicular hypofunction: Secondary | ICD-10-CM

## 2021-05-03 DIAGNOSIS — R7303 Prediabetes: Secondary | ICD-10-CM

## 2021-05-03 DIAGNOSIS — Z9989 Dependence on other enabling machines and devices: Secondary | ICD-10-CM

## 2021-05-03 DIAGNOSIS — G4733 Obstructive sleep apnea (adult) (pediatric): Secondary | ICD-10-CM

## 2021-05-03 HISTORY — DX: Dependence on other enabling machines and devices: Z99.89

## 2021-05-03 HISTORY — DX: Obstructive sleep apnea (adult) (pediatric): G47.33

## 2021-05-03 LAB — BASIC METABOLIC PANEL
BUN: 12 mg/dL (ref 6–23)
CO2: 28 mEq/L (ref 19–32)
Calcium: 9.3 mg/dL (ref 8.4–10.5)
Chloride: 100 mEq/L (ref 96–112)
Creatinine, Ser: 1.06 mg/dL (ref 0.40–1.50)
GFR: 91.2 mL/min (ref >60.00)
Glucose, Bld: 88 mg/dL (ref 70–99)
Potassium: 3.6 mEq/L (ref 3.5–5.1)
Sodium: 137 mEq/L (ref 135–145)

## 2021-05-03 LAB — CBC WITH DIFFERENTIAL/PLATELET
Basophils Absolute: 0.1 10*3/uL (ref 0.0–0.1)
Basophils Relative: 0.9 % (ref 0.0–3.0)
Eosinophils Absolute: 0.1 10*3/uL (ref 0.0–0.7)
Eosinophils Relative: 1.2 % (ref 0.0–5.0)
HCT: 39.4 % (ref 39.0–52.0)
Hemoglobin: 12.9 g/dL — ABNORMAL LOW (ref 13.0–17.0)
Lymphocytes Relative: 34.6 % (ref 12.0–46.0)
Lymphs Abs: 3.1 10*3/uL (ref 0.7–4.0)
MCHC: 32.8 g/dL (ref 30.0–36.0)
MCV: 81.5 fl (ref 78.0–100.0)
Monocytes Absolute: 0.4 10*3/uL (ref 0.1–1.0)
Monocytes Relative: 5.1 % (ref 3.0–12.0)
Neutro Abs: 5.1 10*3/uL (ref 1.4–7.7)
Neutrophils Relative %: 58.2 % (ref 43.0–77.0)
Platelets: 288 10*3/uL (ref 150.0–400.0)
RBC: 4.83 Mil/uL (ref 4.22–5.81)
RDW: 14.7 % (ref 11.5–15.5)
WBC: 8.8 10*3/uL (ref 4.0–10.5)

## 2021-05-03 LAB — HEMOGLOBIN A1C: Hgb A1c MFr Bld: 5.9 % (ref 4.6–6.5)

## 2021-05-03 LAB — TSH: TSH: 1.35 u[IU]/mL (ref 0.35–5.50)

## 2021-05-03 LAB — VITAMIN D 25 HYDROXY (VIT D DEFICIENCY, FRACTURES): VITD: 17.73 ng/mL — ABNORMAL LOW (ref 30.00–100.00)

## 2021-05-03 LAB — TRIGLYCERIDES: Triglycerides: 204 mg/dL — ABNORMAL HIGH (ref 0.0–149.0)

## 2021-05-03 MED ORDER — CHOLECALCIFEROL 1.25 MG (50000 UT) PO CAPS: 50000.0000 [IU] | ORAL_CAPSULE | ORAL | 0 refills | Status: DC

## 2021-05-03 MED ORDER — TIRZEPATIDE 2.5 MG/0.5ML ~~LOC~~ SOAJ: 2.5000 mg | SUBCUTANEOUS | 0 refills | Status: DC

## 2021-05-03 NOTE — Progress Notes (Signed)
Subjective:  Patient ID: Frederick Snyder, male    DOB: 05/20/1986  Age: 35 y.o. MRN: [id number]  CC: Hypertension  This visit occurred during the SARS-CoV-2 public health emergency.  Safety protocols were in place, including screening questions prior to the visit, additional usage of staff PPE, and extensive cleaning of exam room while observing appropriate contact time as indicated for disinfecting solutions.    HPI Frederick Snyder presents for f/up -  He complains of weight gain and fatigue.  He denies DOE, diaphoresis, palpitations, or near syncope.  Outpatient Medications Prior to Visit  Medication Sig Dispense Refill   EDARBYCLOR 40-12.5 MG TABS TAKE 1 TABLET BY MOUTH  DAILY 90 tablet 0   montelukast (SINGULAIR) 10 MG tablet TAKE 1 TABLET BY MOUTH AT  BEDTIME 90 tablet 1   Cholecalciferol 1.25 MG (50000 UT) capsule Take 1 capsule (50,000 Units total) by mouth once a week. 12 capsule 0   oxyCODONE (OXY IR/ROXICODONE) 5 MG immediate release tablet Take 1 tablet (5 mg total) by mouth every 6 (six) hours as needed for moderate pain or severe pain. 30 tablet 0   TRELEGY ELLIPTA 200-62.5-25 MCG/INH AEPB USE 1 INHALATION BY MOUTH  ONCE DAILY AT THE SAME TIME EACH DAY 120 each 0   No facility-administered medications prior to visit.    ROS Review of Systems  Constitutional:  Positive for fatigue and unexpected weight change. Negative for appetite change, chills, diaphoresis and fever.  HENT: Negative.    Respiratory:  Positive for apnea. Negative for cough, shortness of breath and wheezing.   Cardiovascular:  Negative for chest pain, palpitations and leg swelling.  Gastrointestinal:  Negative for abdominal pain, constipation, diarrhea, nausea and vomiting.  Endocrine: Negative.   Genitourinary: Negative.  Negative for difficulty urinating and hematuria.  Musculoskeletal: Negative.  Negative for arthralgias and myalgias.  Skin: Negative.   Neurological: Negative.  Negative  for dizziness, weakness and light-headedness.  Hematological:  Negative for adenopathy. Does not bruise/bleed easily.  Psychiatric/Behavioral: Negative.     Objective:  BP 128/86 (BP Location: Left Arm, Patient Position: Sitting, Cuff Size: Large)   Pulse 94   Temp 98.2 F (36.8 C) (Oral)   Ht 6' (1.829 m)   Wt (!) 400 lb (181.4 kg)   SpO2 98%   BMI 54.25 kg/m   BP Readings from Last 3 Encounters:  05/03/21 128/86  12/08/20 107/68  10/24/20 129/90    Wt Readings from Last 3 Encounters:  05/03/21 (!) 400 lb (181.4 kg)  12/08/20 (!) 380 lb (172.4 kg)  10/24/20 (!) 392 lb 10 oz (178.1 kg)    Physical Exam Vitals reviewed.  Constitutional:      Appearance: He is obese.  HENT:     Nose: Nose normal.     Mouth/Throat:     Mouth: Mucous membranes are moist.  Eyes:     General: No scleral icterus.    Conjunctiva/sclera: Conjunctivae normal.  Cardiovascular:     Rate and Rhythm: Normal rate and regular rhythm.     Heart sounds: No murmur heard.    Comments: EKG- NSR, 82 bpm Normal EKG Pulmonary:     Effort: Pulmonary effort is normal.     Breath sounds: No stridor. No wheezing, rhonchi or rales.  Abdominal:     General: Abdomen is protuberant. Bowel sounds are normal. There is no distension.     Palpations: Abdomen is soft. There is no hepatomegaly, splenomegaly or mass.     Tenderness: There  is no abdominal tenderness.     Hernia: No hernia is present.  Musculoskeletal:     Cervical back: Neck supple.     Right lower leg: No edema.     Left lower leg: No edema.  Lymphadenopathy:     Cervical: No cervical adenopathy.  Skin:    General: Skin is warm and dry.  Neurological:     General: No focal deficit present.     Mental Status: He is alert.  Psychiatric:        Mood and Affect: Mood normal.        Behavior: Behavior normal.    Lab Results  Component Value Date   WBC 8.8 05/03/2021   HGB 12.9 (L) 05/03/2021   HCT 39.4 05/03/2021   PLT 288.0 05/03/2021    GLUCOSE 88 05/03/2021   CHOL 215 (H) 09/07/2020   TRIG 204.0 (H) 05/03/2021   HDL 31.70 (L) 09/07/2020   LDLDIRECT 114.0 09/07/2020   ALT 17 09/07/2020   AST 13 09/07/2020   NA 137 05/03/2021   K 3.6 05/03/2021   CL 100 05/03/2021   CREATININE 1.06 05/03/2021   BUN 12 05/03/2021   CO2 28 05/03/2021   TSH 1.35 05/03/2021   HGBA1C 5.9 05/03/2021    No results found.  Assessment & Plan:   Reco was seen today for hypertension.  Diagnoses and all orders for this visit:  Essential hypertension, benign- His blood pressure is adequately well controlled.  Electrolytes and renal function are normal. -     CBC with Differential/Platelet; Future -     Basic metabolic panel; Future -     EKG 12-Lead -     TSH; Future -     TSH -     Basic metabolic panel -     CBC with Differential/Platelet  Morbid obesity with BMI of 50.0-59.9, adult ([place])- His A1c is up to 5.9%.  His labs are otherwise negative for secondary causes or complications. -     Hemoglobin A1c; Future -     TSH; Future -     Ambulatory referral to General Surgery -     TSH -     Hemoglobin A1c  Vitamin D deficiency disease -     VITAMIN D 25 Hydroxy (Vit-D Deficiency, Fractures); Future -     VITAMIN D 25 Hydroxy (Vit-D Deficiency, Fractures) -     Cholecalciferol 1.25 MG (50000 UT) capsule; Take 1 capsule (50,000 Units total) by mouth once a week.  Pure hypertriglyceridemia- Improvement noted. -     Triglycerides; Future -     Triglycerides  OSA on CPAP  Hypogonadism male -     Testosterone Total,Free,Bio, Males; Future  Hyperprolactinemia (HCC) -     Prolactin; Future  Other orders -     Flu Vaccine QUAD 6+ mos PF IM (Fluarix Quad PF)  I have discontinued Bonna Gains. Niederer's oxyCODONE and Trelegy Ellipta. I am also having him start on tirzepatide. Additionally, I am having him maintain his Edarbyclor, montelukast, and Cholecalciferol.  Meds ordered this encounter  Medications    Cholecalciferol 1.25 MG (50000 UT) capsule    Sig: Take 1 capsule (50,000 Units total) by mouth once a week.    Dispense:  12 capsule    Refill:  0   tirzepatide (MOUNJARO) 2.5 MG/0.5ML Pen    Sig: Inject 2.5 mg into the skin once a week.    Dispense:  2 mL    Refill:  0  Follow-up: Return in about 6 months (around 10/31/2021).  Sanda Linger, MD

## 2021-05-03 NOTE — Patient Instructions (Signed)

## 2021-05-08 ENCOUNTER — Encounter: Payer: Self-pay | Admitting: Internal Medicine

## 2021-05-08 ENCOUNTER — Other Ambulatory Visit: Payer: Self-pay | Admitting: Internal Medicine

## 2021-05-08 DIAGNOSIS — R7303 Prediabetes: Secondary | ICD-10-CM

## 2021-05-08 MED ORDER — RYBELSUS 7 MG PO TABS: 7.0000 mg | ORAL_TABLET | Freq: Every day | ORAL | 0 refills | Status: DC

## 2021-06-01 ENCOUNTER — Encounter: Payer: Self-pay | Admitting: Internal Medicine

## 2021-06-07 ENCOUNTER — Ambulatory Visit: Payer: 59 | Admitting: Internal Medicine

## 2021-06-13 ENCOUNTER — Other Ambulatory Visit: Payer: Self-pay | Admitting: Internal Medicine

## 2021-06-13 ENCOUNTER — Telehealth: Payer: Self-pay | Admitting: Internal Medicine

## 2021-06-13 DIAGNOSIS — I10 Essential (primary) hypertension: Secondary | ICD-10-CM

## 2021-06-13 MED ORDER — EDARBYCLOR 40-12.5 MG PO TABS: 1.0000 | ORAL_TABLET | Freq: Every day | ORAL | 0 refills | Status: DC

## 2021-06-13 NOTE — Telephone Encounter (Signed)
Naltalie/ w [facility] requesting a refill for EDARBYCLOR 40-12.5 MG TABS  Caller inquiring if provider will approve increase of mg  Patient was last seen 05-03-2021  Please advise

## 2021-06-19 ENCOUNTER — Ambulatory Visit: Payer: 59 | Admitting: Neurology

## 2021-07-03 ENCOUNTER — Encounter: Payer: Self-pay | Admitting: Neurology

## 2021-07-03 ENCOUNTER — Ambulatory Visit (INDEPENDENT_AMBULATORY_CARE_PROVIDER_SITE_OTHER): Payer: 59 | Admitting: Neurology

## 2021-07-03 VITALS — BP 111/69 | HR 87 | Ht 72.0 in | Wt >= 6400 oz

## 2021-07-03 DIAGNOSIS — G4733 Obstructive sleep apnea (adult) (pediatric): Secondary | ICD-10-CM | POA: Diagnosis not present

## 2021-07-03 DIAGNOSIS — Z9989 Dependence on other enabling machines and devices: Secondary | ICD-10-CM

## 2021-07-03 NOTE — Progress Notes (Signed)
Subjective:    Patient ID: Frederick Snyder is a 36 y.o. male.  HPI    Interim history:   Frederick Snyder is a 36 year old right-handed gentleman with an underlying medical history of asthma, migraine headaches, vitamin D deficiency, suboptimally controlled hypertension, and morbid obesity with a BMI of over 70, who presents for follow-up consultation of his obstructive sleep apnea.  The patient is unaccompanied today.  I first met him at the request of his primary care physician on 10/24/2020, at which time Frederick Snyder reported snoring and excessive daytime somnolence.  Frederick Snyder was noted to have breathing pauses while asleep per wife's report.  Frederick Snyder was advised to proceed with sleep testing.  Frederick Snyder had a split-night sleep study on 01/04/2021.  Frederick Snyder qualified for an emergency split due to severe sleep apnea.  Baseline AHI was 101.7/h, O2 nadir 81%.  Frederick Snyder had absence of REM sleep during the baseline portion of the study which may very well underestimate his AHI and O2 nadir.  Frederick Snyder did well with CPAP therapy via medium fullface mask from [employer] and was titrated from 5 cm to 11 cm.  I suggested a home treatment pressure of 12 cm.  His set up date was 04/17/2021.  Today, 07/03/2021: I reviewed his CPAP compliance data from 05/30/2021 through 06/28/2021, which is a total of 30 days, during which time Frederick Snyder used his machine every night with percent use days greater than 4 hours at 100%, indicating superb compliance with an average usage of 8 hours and 58 minutes, residual AHI at goal at 2.1/h, leak on the high side with fluctuation, 95th percentile at 51.7 L/min on a pressure of 12 cm with EPR of 3.  Frederick Snyder reports having adjusted well to CPAP therapy.  Frederick Snyder feels improved with regards to his sleep quality and sleep consolidation.  Frederick Snyder does note a leak from time to time, Frederick Snyder has changed his facial hair and shaved his beard a little bit.  Frederick Snyder does have tighten the straps from time to time and sometimes it causes discomfort in the back of his  head.  Leak is not bothersome.  Frederick Snyder uses distilled water in the humidifier chamber and does not have much in the way of mouth dryness.  Frederick Snyder is very motivated to continue with treatment and is quite pleased with how Frederick Snyder is doing.  Frederick Snyder is working on weight loss.  Frederick Snyder may be starting a different injectable medication and reports that Frederick Snyder has been referred to [facility] surgery for consideration of bariatric surgery.   Previously:   10/24/20: (Frederick Snyder) reports snoring and excessive daytime somnolence.  I reviewed your note from 09/07/2020.  His Epworth sleepiness score is 9 out of 24, fatigue severity score is 53 out of 63.  Frederick Snyder reports difficulty maintaining sleep and waking up in the middle of the night, Frederick Snyder has woken up with a sense of gasping for air and his wife has noted apneas while Frederick Snyder is asleep.  His parents had sleep apnea, mom also had congestive heart failure and passed away at age 72 and father passed away at 16.  His dad had weight loss surgery.  Patient has considered weight loss surgery.  Frederick Snyder is scheduled to have left inguinal hernia surgery next month and wants to talk to his general surgeon about bariatric surgery as well.  His weight has increased in the past few years but stable in the past year.  Frederick Snyder lives with his wife and 25 year old daughter.  Frederick Snyder also has an older  daughter, age 68.  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  They have cats in the household, no TV in the bedroom.  Frederick Snyder is a non-smoker and drinks alcohol rarely, maybe twice a year and quite a few servings of caffeine in the form of coffee, tea and soda, Frederick Snyder estimates about 4-5 servings per day on average.  Frederick Snyder has woken up occasionally with a dull, achy headache, different from his typical migraines.  Frederick Snyder has nocturia about twice per average night.  His Past Medical History Is Significant For: Past Medical History:  Diagnosis Date   Asthma    Hyperlipidemia    diet controlled - no meds   Hypertension    Migraine    OSA on CPAP  05/03/2021   PONV (postoperative nausea and vomiting)    Vitamin D deficiency     His Past Surgical History Is Significant For: Past Surgical History:  Procedure Laterality Date   INGUINAL HERNIA REPAIR Left 12/08/2020   Procedure: OPEN HERNIA REPAIR INGUINAL ADULT;  Surgeon: Coralie Keens, MD;  Location: [place];  Service: General;  Laterality: Left;  TAP BLOCK AND LMA.   INSERTION OF MESH Left 12/08/2020   Procedure: INSERTION OF MESH;  Surgeon: Coralie Keens, MD;  Location: [place];  Service: General;  Laterality: Left;   KNEE SURGERY     tubes in ears     WISDOM TOOTH EXTRACTION      His Family History Is Significant For: Family History  Problem Relation Age of Onset   Diabetes Mother    Heart disease Mother    Sleep apnea Mother    Diabetes Father    Heart disease Father    Hypertension Father    Sleep apnea Father    Diabetes Other    Hyperlipidemia Other    Hypertension Other    Other Neg Hx        hypogonadism    His Social History Is Significant For: Social History   Socioeconomic History   Marital status: Married    Spouse name: Not on file   Number of children: Not on file   Years of education: Not on file   Highest education level: Not on file  Occupational History   Occupation: Licensed conveyancer car parts.   Tobacco Use   Smoking status: Never   Smokeless tobacco: Former    Types: Chew   Tobacco comments:    Quit in approx 2007  Vaping Use   Vaping Use: Never used  Substance and Sexual Activity   Alcohol use: No   Drug use: No   Sexual activity: Yes    Partners: Female  Other Topics Concern   Not on file  Social History Narrative   Lives at home with wife and child   Social Determinants of Health   Financial Resource Strain: Not on file  Food Insecurity: Not on file  Transportation Needs: Not on file  Physical Activity: Not on file  Stress: Not on file  Social Connections: Not on file    His Allergies Are:  No Known Allergies:   His  Current Medications Are:  Outpatient Encounter Medications as of 07/03/2021  Medication Sig   Azilsartan-Chlorthalidone (EDARBYCLOR) 40-12.5 MG TABS Take 1 tablet by mouth daily.   Cholecalciferol 1.25 MG (50000 UT) capsule Take 1 capsule (50,000 Units total) by mouth once a week.   montelukast (SINGULAIR) 10 MG tablet TAKE 1 TABLET BY MOUTH AT  BEDTIME   Semaglutide (RYBELSUS) 7 MG  TABS Take 7 mg by mouth daily.   No facility-administered encounter medications on file as of 07/03/2021.  :  Review of Systems:  Out of a complete 14 point review of systems, all are reviewed and negative with the exception of these symptoms as listed below:  Review of Systems  Neurological:        Pt is here for follow up with Sleep Apnea. Pt states CPAP has been going well and Frederick Snyder has no questions or concerns for today's visit  .  ESS:1 FSS:32   Objective:  Neurological Exam  Physical Exam Physical Examination:   Vitals:   07/03/21 1004  BP: 111/69  Pulse: 87    General Examination: The patient is a very pleasant 36 y.o. male in no acute distress. Frederick Snyder appears well-developed and well-nourished and well groomed.   HEENT: Normocephalic, atraumatic, pupils are equal, round and reactive to light, extraocular tracking is good without limitation to gaze excursion or nystagmus noted. Hearing is grossly intact. Face is symmetric with normal facial animation. Speech is clear with no dysarthria noted. There is no hypophonia. There is no lip, neck/head, jaw or voice tremor. Neck is supple with full range of passive and active motion. There are no carotid bruits on auscultation. Oropharynx exam reveals: mild mouth dryness, adequate dental hygiene and mild airway crowding, tongue protrudes centrally and palate elevates symmetrically, neck circumference of 18-7/8 inches.  Frederick Snyder has a mild overbite.  Evidence of tooth grinding noted.   Chest: Clear to auscultation without wheezing, rhonchi or crackles noted.   Heart:  S1+S2+0, regular and normal without murmurs, rubs or gallops noted.    Abdomen: Soft, non-tender and non-distended.   Extremities: There is no obvious edema in the distal lower extremities bilaterally.    Skin: Warm and dry without trophic changes noted.    Musculoskeletal: exam reveals no obvious joint deformities.    Neurologically:  Mental status: The patient is awake, alert and oriented in all 4 spheres. His immediate and remote memory, attention, language skills and fund of knowledge are appropriate. There is no evidence of aphasia, agnosia, apraxia or anomia. Speech is clear with normal prosody and enunciation. Thought process is linear. Mood is normal and affect is normal.  Cranial nerves II - XII are as described above under HEENT exam.  Motor exam: Normal bulk, strength and tone is noted. There is no tremor, fine motor skills and coordination: grossly intact.  Cerebellar testing: No dysmetria or intention tremor. There is no truncal or gait ataxia.  Sensory exam: intact to light touch in the upper and lower extremities.  Gait, station and balance: Frederick Snyder stands easily. No veering to one side is noted. No leaning to one side is noted. Posture is age-appropriate and stance is narrow based. Gait shows normal stride length and normal pace. No problems turning are noted.    Assessment and Plan:    In summary, Frederick Snyder is a very pleasant 36 year old male with an underlying medical history of asthma, migraine headaches, vitamin D deficiency, suboptimally controlled hypertension, and morbid obesity with a BMI of over 34, who presents for follow-up consultation of his severe obstructive sleep apnea.  Frederick Snyder had a split-night sleep study on 01/04/2021 with a baseline AHI of 101.7/h, O2 nadir 81%.  Frederick Snyder did rather well with CPAP therapy.  Frederick Snyder is currently established on CPAP of 12 cm.  Frederick Snyder is fully compliant with treatment and endorses improvement in his sleep quality, sleep consolidation, daytime  somnolence.  Frederick Snyder is commended for his treatment adherence.  Frederick Snyder is working on weight loss.  Frederick Snyder has started the medication and may be switching to [facility].  Frederick Snyder has been referred to [facility] surgery for consideration of bariatric surgery.  Frederick Snyder is advised to continue with his current CPAP.  His air leak from the mask is on the higher side but fluctuates.  Frederick Snyder is advised to follow-up routinely to see one of our nurse practitioners in this clinic in 1 year, sooner if needed.  We reviewed his sleep study results and his compliance data in detail today.  I answered all his questions today and Frederick Snyder was in agreement.   I spent 30 minutes in total face-to-face time and in reviewing records during pre-charting, more than 50% of which was spent in counseling and coordination of care, reviewing test results, reviewing medications and treatment regimen and/or in discussing or reviewing the diagnosis of OSA, the prognosis and treatment options. Pertinent laboratory and imaging test results that were available during this visit with the patient were reviewed by me and considered in my medical decision making (see chart for details).

## 2021-07-03 NOTE — Patient Instructions (Signed)
It was nice to see you again today. I am glad to hear that CPAP is going well, you are fully compliant with it.  Keep up the good work! We can see you in 1 year, you can see one of our nurse practitioners as you are stable.  Please continue using your CPAP regularly. While your insurance requires that you use CPAP at least 4 hours each night on 70% of the nights, I recommend, that you not skip any nights and use it throughout the night if you can. Getting used to CPAP and staying with the treatment long term does take time and patience and discipline. Untreated obstructive sleep apnea when it is moderate to severe can have an adverse impact on cardiovascular health and raise her risk for heart disease, arrhythmias, hypertension, congestive heart failure, stroke and diabetes. Untreated obstructive sleep apnea causes sleep disruption, nonrestorative sleep, and sleep deprivation. This can have an impact on your day to day functioning and cause daytime sleepiness and impairment of cognitive function, memory loss, mood disturbance, and problems focussing. Using CPAP regularly can improve these symptoms.

## 2021-07-26 ENCOUNTER — Encounter: Payer: Self-pay | Admitting: Internal Medicine

## 2021-07-26 MED ORDER — VALSARTAN-HYDROCHLOROTHIAZIDE 160-12.5 MG PO TABS: 1.0000 | ORAL_TABLET | Freq: Every day | ORAL | 0 refills | Status: DC

## 2021-08-24 ENCOUNTER — Ambulatory Visit (INDEPENDENT_AMBULATORY_CARE_PROVIDER_SITE_OTHER): Payer: 59 | Admitting: Internal Medicine

## 2021-08-24 ENCOUNTER — Other Ambulatory Visit: Payer: Self-pay

## 2021-08-24 ENCOUNTER — Encounter: Payer: Self-pay | Admitting: Internal Medicine

## 2021-08-24 VITALS — BP 136/88 | HR 81 | Temp 98.1°F | Resp 16 | Ht 72.0 in | Wt 398.0 lb

## 2021-08-24 DIAGNOSIS — E291 Testicular hypofunction: Secondary | ICD-10-CM | POA: Diagnosis not present

## 2021-08-24 DIAGNOSIS — E221 Hyperprolactinemia: Secondary | ICD-10-CM

## 2021-08-24 DIAGNOSIS — I10 Essential (primary) hypertension: Secondary | ICD-10-CM | POA: Diagnosis not present

## 2021-08-24 DIAGNOSIS — R5382 Chronic fatigue, unspecified: Secondary | ICD-10-CM | POA: Diagnosis not present

## 2021-08-24 DIAGNOSIS — R7303 Prediabetes: Secondary | ICD-10-CM

## 2021-08-24 DIAGNOSIS — G4452 New daily persistent headache (NDPH): Secondary | ICD-10-CM

## 2021-08-24 LAB — CBC WITH DIFFERENTIAL/PLATELET
Basophils Absolute: 0.1 10*3/uL (ref 0.0–0.1)
Basophils Relative: 0.7 % (ref 0.0–3.0)
Eosinophils Absolute: 0.1 10*3/uL (ref 0.0–0.7)
Eosinophils Relative: 1.4 % (ref 0.0–5.0)
HCT: 40.4 % (ref 39.0–52.0)
Hemoglobin: 13.5 g/dL (ref 13.0–17.0)
Lymphocytes Relative: 33.4 % (ref 12.0–46.0)
Lymphs Abs: 3 10*3/uL (ref 0.7–4.0)
MCHC: 33.5 g/dL (ref 30.0–36.0)
MCV: 77.9 fl — ABNORMAL LOW (ref 78.0–100.0)
Monocytes Absolute: 0.6 10*3/uL (ref 0.1–1.0)
Monocytes Relative: 6.1 % (ref 3.0–12.0)
Neutro Abs: 5.3 10*3/uL (ref 1.4–7.7)
Neutrophils Relative %: 58.4 % (ref 43.0–77.0)
Platelets: 296 10*3/uL (ref 150.0–400.0)
RBC: 5.18 Mil/uL (ref 4.22–5.81)
RDW: 15.5 % (ref 11.5–15.5)
WBC: 9 10*3/uL (ref 4.0–10.5)

## 2021-08-24 LAB — BASIC METABOLIC PANEL
BUN: 11 mg/dL (ref 6–23)
CO2: 29 mEq/L (ref 19–32)
Calcium: 9.4 mg/dL (ref 8.4–10.5)
Chloride: 99 mEq/L (ref 96–112)
Creatinine, Ser: 0.97 mg/dL (ref 0.40–1.50)
GFR: 101.23 mL/min (ref >60.00)
Glucose, Bld: 94 mg/dL (ref 70–99)
Potassium: 3.6 mEq/L (ref 3.5–5.1)
Sodium: 137 mEq/L (ref 135–145)

## 2021-08-24 MED ORDER — VALSARTAN 320 MG PO TABS: 320.0000 mg | ORAL_TABLET | Freq: Every day | ORAL | 1 refills | Status: DC

## 2021-08-24 MED ORDER — INDAPAMIDE 1.25 MG PO TABS: 1.2500 mg | ORAL_TABLET | Freq: Every day | ORAL | 0 refills | Status: DC

## 2021-08-24 NOTE — Patient Instructions (Signed)

## 2021-08-24 NOTE — Progress Notes (Signed)
? ?Subjective:  ?Patient ID: Frederick Snyder, male    DOB: [birth year]  Age: 36 y.o. MRN: [id number] ? ?CC: Hypertension ? ?This visit occurred during the SARS-CoV-2 public health emergency.  Safety protocols were in place, including screening questions prior to the visit, additional usage of staff PPE, and extensive cleaning of exam room while observing appropriate contact time as indicated for disinfecting solutions.   ? ?HPI ?Frederick Snyder presents for f/up - ? ?He complains that his BP is not adequately well controlled. He has chronic, intermittent headaches and a history or prolactinoma. ? ?Outpatient Medications Prior to Visit  ?Medication Sig Dispense Refill  ? Cholecalciferol 1.25 MG (50000 UT) capsule Take 1 capsule (50,000 Units total) by mouth once a week. (Patient not taking: Reported on 08/25/2021) 12 capsule 0  ? montelukast (SINGULAIR) 10 MG tablet TAKE 1 TABLET BY MOUTH AT  BEDTIME 90 tablet 1  ? Semaglutide (RYBELSUS) 7 MG TABS Take 7 mg by mouth daily. (Patient not taking: Reported on 08/25/2021) 90 tablet 0  ? valsartan-hydrochlorothiazide (DIOVAN-HCT) 160-12.5 MG tablet Take 1 tablet by mouth daily. 90 tablet 0  ? ?No facility-administered medications prior to visit.  ? ? ?ROS ?Review of Systems  ?Constitutional:  Positive for fatigue. Negative for appetite change, diaphoresis and unexpected weight change.  ?HENT: Negative.    ?Eyes: Negative.   ?Respiratory:  Positive for apnea. Negative for cough, chest tightness, shortness of breath and wheezing.   ?Cardiovascular:  Negative for chest pain, palpitations and leg swelling.  ?Gastrointestinal:  Negative for abdominal pain, constipation, diarrhea, nausea and vomiting.  ?Endocrine: Negative.   ?Genitourinary: Negative.   ?Musculoskeletal: Negative.   ?Skin: Negative.   ?Neurological:  Positive for headaches. Negative for dizziness, weakness, light-headedness and numbness.  ?Hematological:  Negative for adenopathy. Does not bruise/bleed  easily.  ?Psychiatric/Behavioral: Negative.    ? ?Objective:  ?BP 136/88 (BP Location: Left Arm, Patient Position: Sitting, Cuff Size: Large)   Pulse 81   Temp 98.1 ?F (36.7 ?C) (Oral)   Resp 16   Ht 6' (1.829 m)   Wt (!) 398 lb (180.5 kg)   SpO2 99%   BMI 53.98 kg/m?  ? ?BP Readings from Last 3 Encounters:  ?08/24/21 136/88  ?07/03/21 111/69  ?05/03/21 128/86  ? ? ?Wt Readings from Last 3 Encounters:  ?08/24/21 (!) 398 lb (180.5 kg)  ?07/03/21 (!) 400 lb (181.4 kg)  ?05/03/21 (!) 400 lb (181.4 kg)  ? ? ?Physical Exam ?Vitals reviewed.  ?Constitutional:   ?   Appearance: He is obese.  ?HENT:  ?   Nose: Nose normal.  ?   Mouth/Throat:  ?   Mouth: Mucous membranes are moist.  ?Eyes:  ?   General: No scleral icterus. ?   Conjunctiva/sclera: Conjunctivae normal.  ?Cardiovascular:  ?   Rate and Rhythm: Normal rate and regular rhythm.  ?   Heart sounds: No murmur heard. ?Pulmonary:  ?   Effort: Pulmonary effort is normal.  ?   Breath sounds: No stridor. No wheezing, rhonchi or rales.  ?Abdominal:  ?   General: Abdomen is protuberant. There is no distension.  ?   Palpations: Abdomen is soft. There is no hepatomegaly, splenomegaly or mass.  ?   Tenderness: There is no abdominal tenderness. There is no guarding.  ?   Hernia: No hernia is present.  ?Musculoskeletal:     ?   General: Normal range of motion.  ?   Cervical back: Neck supple.  ?  Right lower leg: No edema.  ?   Left lower leg: No edema.  ?Lymphadenopathy:  ?   Cervical: No cervical adenopathy.  ?Skin: ?   General: Skin is warm and dry.  ?Neurological:  ?   General: No focal deficit present.  ?   Mental Status: He is alert.  ?Psychiatric:     ?   Mood and Affect: Mood normal.     ?   Behavior: Behavior normal.  ? ? ?Lab Results  ?Component Value Date  ? WBC 9.0 08/24/2021  ? HGB 13.5 08/24/2021  ? HCT 40.4 08/24/2021  ? PLT 296.0 08/24/2021  ? GLUCOSE 94 08/24/2021  ? CHOL 215 (H) 09/07/2020  ? TRIG 204.0 (H) 05/03/2021  ? HDL 31.70 (L) 09/07/2020  ?  LDLDIRECT 114.0 09/07/2020  ? ALT 17 09/07/2020  ? AST 13 09/07/2020  ? NA 137 08/24/2021  ? K 3.6 08/24/2021  ? CL 99 08/24/2021  ? CREATININE 0.97 08/24/2021  ? BUN 11 08/24/2021  ? CO2 29 08/24/2021  ? TSH 1.35 05/03/2021  ? HGBA1C 5.9 05/03/2021  ? ? ?No results found. ? ?Assessment & Plan:  ? ?Frederick Snyder was seen today for hypertension. ? ?Diagnoses and all orders for this visit: ? ?Chronic fatigue- His testosterone level is low. ?-     Basic metabolic panel; Future ?-     Basic metabolic panel ? 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 ?-     Prolactin; Future ?-     Testosterone Total,Free,Bio, Males; Future ?-     Testosterone Total,Free,Bio, Males ?-     Prolactin ?-     Follicle stimulating hormone; Future ?-     Luteinizing hormone; Future ?-     MR Brain Wo Contrast; Future ? ?Hyperprolactinemia (HCC)- Prolactin if normal. Will get an MRI to check the pituitary adenoma. ?-     Prolactin; Future ?-     Prolactin ?-     Follicle stimulating hormone; Future ?-     Luteinizing hormone; Future ?-     MR Brain Wo Contrast; Future ? ?Prediabetes ?-     Basic metabolic panel; Future ?-     Basic metabolic panel ? ?Essential hypertension, benign- His BP is not adequately well controlled. Will upgrade to a more potent thiazide diuretic and he will work on his lifestyle modifications. ?-     valsartan (DIOVAN) 320 MG tablet; Take 1 tablet (320 mg total) by mouth daily. ?-     indapamide (LOZOL) 1.25 MG tablet; Take 1 tablet (1.25 mg total) by mouth daily. ?-     CBC with Differential/Platelet; Future ?-     Basic metabolic panel; Future ?-     Basic metabolic panel ?-     CBC with Differential/Platelet ? ?New daily persistent headache- Will screen for mass/bleed. ?-     MR Brain Wo Contrast; Future ? ? ?I have discontinued Frederick Snyder. Frederick Snyder's valsartan-hydrochlorothiazide. I am also having him start on valsartan and indapamide. Additionally, I am having him maintain his montelukast, Cholecalciferol,  and Rybelsus. ? ?Meds ordered this encounter  ?Medications  ? valsartan (DIOVAN) 320 MG tablet  ?  Sig: Take 1 tablet (320 mg total) by mouth daily.  ?  Dispense:  90 tablet  ?  Refill:  1  ? indapamide (LOZOL) 1.25 MG tablet  ?  Sig: Take 1 tablet (1.25 mg total) by mouth daily.  ?  Dispense:  90 tablet  ?  Refill:  0  ? ? ? ?  Follow-up: Return in about 3 months (around 11/24/2021). ? ?Sanda Lingerhomas Danetra Glock, MD ?

## 2021-08-25 ENCOUNTER — Encounter: Payer: Self-pay | Admitting: Behavioral Health

## 2021-08-25 ENCOUNTER — Ambulatory Visit (INDEPENDENT_AMBULATORY_CARE_PROVIDER_SITE_OTHER): Payer: 59 | Admitting: Behavioral Health

## 2021-08-25 ENCOUNTER — Encounter: Payer: Self-pay | Admitting: Internal Medicine

## 2021-08-25 VITALS — BP 148/90 | HR 98

## 2021-08-25 DIAGNOSIS — G4452 New daily persistent headache (NDPH): Secondary | ICD-10-CM | POA: Insufficient documentation

## 2021-08-25 DIAGNOSIS — F902 Attention-deficit hyperactivity disorder, combined type: Secondary | ICD-10-CM | POA: Diagnosis not present

## 2021-08-25 DIAGNOSIS — F411 Generalized anxiety disorder: Secondary | ICD-10-CM

## 2021-08-25 DIAGNOSIS — F331 Major depressive disorder, recurrent, moderate: Secondary | ICD-10-CM

## 2021-08-25 LAB — TESTOSTERONE TOTAL,FREE,BIO, MALES
Albumin: 4.5 g/dL (ref 3.6–5.1)
Sex Hormone Binding: 14 nmol/L (ref 10–50)
Testosterone: 111 ng/dL — ABNORMAL LOW (ref 250–827)

## 2021-08-25 LAB — PROLACTIN: Prolactin: 7.6 ng/mL (ref 2.0–18.0)

## 2021-08-25 MED ORDER — LISDEXAMFETAMINE DIMESYLATE 40 MG PO CAPS: 40.0000 mg | ORAL_CAPSULE | ORAL | 0 refills | Status: DC

## 2021-08-28 ENCOUNTER — Encounter: Payer: Self-pay | Admitting: Behavioral Health

## 2021-08-28 NOTE — Progress Notes (Signed)
[facility] MD/PA/NP Initial Note ? ?08/25/2021 2:15 PM ?[name]  ?MRN:  [id number] ? ?Chief Complaint:  ?Chief Complaint   ?Depression; Anxiety; ADHD; Medication Problem; Establish Care ?  ? ? ?HPI:  ? ?36 year old male presents to this office for initial visit and to establish care. He expresses concern that he has been off his Vyvanse for an extended period but is now struggling with attention and focus problems at work. Says that has trouble getting distracted and not able to complete on task before moving on. Says that this has become a serious problem and could threaten his job. He says he was diagnosed with ADHD as a child 428-69 years of age and has previously tried several medications in the past. He was previously on Vyvanse 70 mg daily. ?He would like to reinitiate the medication but understand we would not be able to continue that dose at this time. He understand he should follow up with his PCP promptly for uncontrolled BP. He must monitor BP closely and stop Vyvanse if it makes more severe.  He reports very low level anxiety and depression. Says he is sleeping 7-8 hours per night. Denies mania, no psychosis, no SI/HI. ? ?Previous psychiatric medications: ?Ritalin ?Vyvanse ?Adderall  ? ? ?Visit Diagnosis:  ?  ICD-10-CM   ?1. Generalized anxiety disorder  F41.1   ?  ?2. Major depressive disorder, recurrent episode, moderate (HCC)  F33.1   ?  ?3. Attention deficit hyperactivity disorder (ADHD), combined type  F90.2 lisdexamfetamine (VYVANSE) 40 MG capsule  ?  ? ? ?Past Psychiatric History: ADHD ? ?Past Medical History:  ?Past Medical History:  ?Diagnosis Date  ? Asthma   ? Hyperlipidemia   ? diet controlled - no meds  ? Hypertension   ? Migraine   ? OSA on CPAP 05/03/2021  ? PONV (postoperative nausea and vomiting)   ? Vitamin D deficiency   ?  ?Past Surgical History:  ?Procedure Laterality Date  ? INGUINAL HERNIA REPAIR Left 12/08/2020  ? Procedure: OPEN HERNIA REPAIR INGUINAL ADULT;  Surgeon:  Abigail MiyamotoBlackman, Douglas, MD;  Location: [facility] OR;  Service: General;  Laterality: Left;  TAP BLOCK AND LMA.  ? INSERTION OF MESH Left 12/08/2020  ? Procedure: INSERTION OF MESH;  Surgeon: Abigail MiyamotoBlackman, Douglas, MD;  [facility];  Service: General;  Laterality: Left;  ? KNEE SURGERY    ? tubes in ears    ? WISDOM TOOTH EXTRACTION    ? ? ?Family Psychiatric History: see chart ? ?Family History:  ?Family History  ?Problem Relation Age of Onset  ? Diabetes Mother   ? Heart disease Mother   ? Sleep apnea Mother   ? Diabetes Father   ? Heart disease Father   ? Hypertension Father   ? Sleep apnea Father   ? Diabetes Other   ? Hyperlipidemia Other   ? Hypertension Other   ? Other Neg Hx   ?     hypogonadism  ? ? ?Social History:  ?Social History  ? ?Socioeconomic History  ? Marital status: Married  ?  Spouse name: Not on file  ? Number of children: Not on file  ? Years of education: Not on file  ? Highest education level: Not on file  ?Occupational History  ? Occupation: [employer].   ?Tobacco Use  ? Smoking status: Never  ? Smokeless tobacco: Former  ?  Types: Chew  ? Tobacco comments:  ?  Quit in approx 2007  ?Vaping Use  ?  Vaping Use: Never used  ?Substance and Sexual Activity  ? Alcohol use: No  ? Drug use: No  ? Sexual activity: Yes  ?  Partners: Female  ?Other Topics Concern  ? Not on file  ?Social History Narrative  ? Lives at home with wife and child  ? ?Social Determinants of Health  ? ?Financial Resource Strain: Not on file  ?Food Insecurity: Not on file  ?Transportation Needs: Not on file  ?Physical Activity: Not on file  ?Stress: Not on file  ?Social Connections: Not on file  ? ? ?Allergies: No Known Allergies ? ?Metabolic Disorder Labs: ?Lab Results  ?Component Value Date  ? HGBA1C 5.9 05/03/2021  ? ?Lab Results  ?Component Value Date  ? PROLACTIN 7.6 08/24/2021  ? PROLACTIN 12.7 04/29/2018  ? ?Lab Results  ?Component Value Date  ? CHOL 215 (H) 09/07/2020  ? TRIG 204.0 (H) 05/03/2021  ? HDL 31.70 (L) 09/07/2020  ?  CHOLHDL 7 09/07/2020  ? VLDL 61.6 (H) 09/07/2020  ? ?Lab Results  ?Component Value Date  ? TSH 1.35 05/03/2021  ? TSH 1.48 09/07/2020  ? ? ?Therapeutic Level Labs: ?No results found for: LITHIUM ?No results found for: VALPROATE ?No components found for:  CBMZ ? ?Current Medications: ?Current Outpatient Medications  ?Medication Sig Dispense Refill  ? indapamide (LOZOL) 1.25 MG tablet Take 1 tablet (1.25 mg total) by mouth daily. 90 tablet 0  ? lisdexamfetamine (VYVANSE) 40 MG capsule Take 1 capsule (40 mg total) by mouth every morning. 30 capsule 0  ? montelukast (SINGULAIR) 10 MG tablet TAKE 1 TABLET BY MOUTH AT  BEDTIME 90 tablet 1  ? valsartan (DIOVAN) 320 MG tablet Take 1 tablet (320 mg total) by mouth daily. 90 tablet 1  ? Cholecalciferol 1.25 MG (50000 UT) capsule Take 1 capsule (50,000 Units total) by mouth once a week. (Patient not taking: Reported on 08/25/2021) 12 capsule 0  ? Semaglutide (RYBELSUS) 7 MG TABS Take 7 mg by mouth daily. (Patient not taking: Reported on 08/25/2021) 90 tablet 0  ? ?No current facility-administered medications for this visit.  ? ? ?Medication Side Effects: none ? ?Orders placed this visit:  No orders of the defined types were placed in this encounter. ? ? ?Psychiatric Specialty Exam: ? ?Review of Systems  ?HENT:  Positive for tinnitus.   ?Cardiovascular:  Positive for leg swelling.   ?There were no vitals taken for this visit.There is no height or weight on file to calculate BMI.  ?General Appearance: Casual, Neat, and Well Groomed  ?Eye Contact:  Good  ?Speech:  Clear and Coherent  ?Volume:  Normal  ?Mood:  Anxious  ?Affect:  Anxious  ?Thought Process:  Coherent  ?Orientation:  Full (Time, Place, and Person)  ?Thought Content: Logical   ?Suicidal Thoughts:  No  ?Homicidal Thoughts:  No  ?Memory:  WNL  ?Judgement:  Good  ?Insight:  Good  ?Psychomotor Activity:  Normal  ?Concentration:  Concentration: Good  ?Recall:  Good  ?Fund of Knowledge: Good  ?Language: Good  ?Assets:  Desire  for Improvement  ?ADL's:  Intact  ?Cognition: WNL  ?Prognosis:  Good  ? ?Screenings:  ?PHQ2-9   ? ?Flowsheet Row Office Visit from 08/25/2021 in [facility] Office Visit from 09/07/2020 in [facility]  ?PHQ-2 Total Score 0 0  ?PHQ-9 Total Score -- 0  ? ?  ? ?Flowsheet Row Admission (Discharged) from 12/08/2020 in [facility]  ?C-SSRS RISK CATEGORY No Risk  ? ?  ? ? ?  Receiving Psychotherapy: No  ? ?Treatment Plan/Recommendations:  ? ?Greater than 50% of  60 min face to face time with patient was spent on counseling and coordination of care. We discussed his long hx of  ADHD stemming back to age of 46-56 years of age.We discussed associated health risk including HTN and obesity. He has been of the medication for a long period and discussed slowly increasing again and not being able to start back on the Vyvanse 70 mg as before. ?We agreed to reinitiate Vyvanse 40 mg daily. He understands the risk and he agreed to follow up with his PCP for his BP. I explained that his medication may not be sufficient and he most likely needs reevaluation of Hypertensive medications . He understands he should stop Vyvanse if making BP more severe.  ?Today we agreed to start Vyvanse 40 mg daily ?To follow up in 4 weeks to reassess ?Will monitor BP and HR regularly and log to provide to PCP ?Will report side effects or worsening symptoms and seek medical attention immediately if BP rises above 180.  ?Discussed potential benefits, risks, and side effects of stimulants with patient to include increased heart rate, palpitations, insomnia, increased anxiety, increased irritability, or decreased appetite.  Instructed patient to contact office if experiencing any significant tolerability issues.  ?Reviewed PDMP ? ? ?Joan Flores, NP ? ?         ?

## 2021-08-29 ENCOUNTER — Ambulatory Visit: Payer: 59 | Admitting: Behavioral Health

## 2021-08-29 ENCOUNTER — Other Ambulatory Visit (INDEPENDENT_AMBULATORY_CARE_PROVIDER_SITE_OTHER): Payer: 59

## 2021-08-29 DIAGNOSIS — E291 Testicular hypofunction: Secondary | ICD-10-CM

## 2021-08-29 DIAGNOSIS — E221 Hyperprolactinemia: Secondary | ICD-10-CM | POA: Diagnosis not present

## 2021-08-29 LAB — FOLLICLE STIMULATING HORMONE: FSH: 1.5 m[IU]/mL (ref 1.4–18.1)

## 2021-08-29 LAB — LUTEINIZING HORMONE: LH: 2.75 m[IU]/mL (ref 1.50–9.30)

## 2021-08-30 ENCOUNTER — Other Ambulatory Visit: Payer: Self-pay | Admitting: Internal Medicine

## 2021-08-30 ENCOUNTER — Encounter: Payer: Self-pay | Admitting: Internal Medicine

## 2021-08-30 DIAGNOSIS — E230 Hypopituitarism: Secondary | ICD-10-CM | POA: Insufficient documentation

## 2021-08-30 MED ORDER — XYOSTED 75 MG/0.5ML ~~LOC~~ SOAJ: 75.0000 mg | SUBCUTANEOUS | 0 refills | Status: DC

## 2021-09-04 ENCOUNTER — Ambulatory Visit
Admission: RE | Admit: 2021-09-04 | Discharge: 2021-09-04 | Disposition: A | Discharge status: Home / Self Care | Payer: 59 | Source: Ambulatory Visit | Attending: Internal Medicine | Admitting: Internal Medicine

## 2021-09-04 ENCOUNTER — Telehealth: Payer: Self-pay

## 2021-09-04 ENCOUNTER — Other Ambulatory Visit: Payer: Self-pay | Admitting: Internal Medicine

## 2021-09-04 ENCOUNTER — Encounter: Payer: Self-pay | Admitting: Internal Medicine

## 2021-09-04 DIAGNOSIS — E291 Testicular hypofunction: Secondary | ICD-10-CM

## 2021-09-04 DIAGNOSIS — E221 Hyperprolactinemia: Secondary | ICD-10-CM

## 2021-09-04 DIAGNOSIS — G4452 New daily persistent headache (NDPH): Secondary | ICD-10-CM

## 2021-09-04 DIAGNOSIS — E230 Hypopituitarism: Secondary | ICD-10-CM

## 2021-09-04 MED ORDER — TESTOSTERONE CYPIONATE 200 MG/ML IM SOLN: 200.0000 mg | INTRAMUSCULAR | 0 refills | Status: DC

## 2021-09-04 NOTE — Telephone Encounter (Signed)
Frederick Snyder calling from [employer] wanting to know if Dr. Yetta Barre wanted to do PA for [facility] or use the covered alternate Cypionate/Enanthate. ? ?Frederick Snyder will also send the fax request. ? ?Please advise ?

## 2021-09-06 NOTE — Telephone Encounter (Signed)
Key [id number] ?

## 2021-09-08 NOTE — Telephone Encounter (Signed)
Rep w/ [employer] informing provider PA for Frederick Snyder has been approved for 1 year ?

## 2021-09-08 NOTE — Telephone Encounter (Signed)
3/16 OV has now been faxed as well.  ?

## 2021-09-08 NOTE — Telephone Encounter (Signed)
Rob is calling to confirmed we had receive the fax from [employer]. ? ?He states he needs supporting OV notes which include Dx for medication, 2 morning Testerone level in the last 12 mo with date it was reported. Due by 4/1 or they will have to make decision with what they have. ? ?Please advise. ?

## 2021-09-08 NOTE — Telephone Encounter (Signed)
Insurance requiring additional information.  ? ?Form completed and faxed. ?

## 2021-09-11 NOTE — Telephone Encounter (Signed)
Faxed approval letter received  ? ?Approved dates 09/08/21-09/09/22 ? ?Letter sent to scan ?

## 2021-09-18 ENCOUNTER — Ambulatory Visit: Payer: 59 | Admitting: Behavioral Health

## 2021-09-25 ENCOUNTER — Other Ambulatory Visit: Payer: Self-pay | Admitting: Behavioral Health

## 2021-09-25 DIAGNOSIS — F902 Attention-deficit hyperactivity disorder, combined type: Secondary | ICD-10-CM

## 2021-09-25 NOTE — Telephone Encounter (Signed)
Last apt 3/17 ?Next apt was a no show 4/10 ? ? ? ?ADM: please get pt scheduled if continuing care with Arlys John ?

## 2021-09-26 ENCOUNTER — Encounter: Payer: Self-pay | Admitting: Internal Medicine

## 2021-10-11 ENCOUNTER — Ambulatory Visit (INDEPENDENT_AMBULATORY_CARE_PROVIDER_SITE_OTHER): Payer: 59 | Admitting: Behavioral Health

## 2021-10-11 ENCOUNTER — Encounter: Payer: Self-pay | Admitting: Behavioral Health

## 2021-10-11 DIAGNOSIS — F411 Generalized anxiety disorder: Secondary | ICD-10-CM

## 2021-10-11 DIAGNOSIS — F902 Attention-deficit hyperactivity disorder, combined type: Secondary | ICD-10-CM | POA: Diagnosis not present

## 2021-10-11 DIAGNOSIS — F331 Major depressive disorder, recurrent, moderate: Secondary | ICD-10-CM | POA: Diagnosis not present

## 2021-10-11 MED ORDER — SERTRALINE HCL 50 MG PO TABS: 50.0000 mg | ORAL_TABLET | Freq: Every day | ORAL | 1 refills | Status: DC

## 2021-10-11 MED ORDER — LISDEXAMFETAMINE DIMESYLATE 40 MG PO CAPS: ORAL_CAPSULE | ORAL | 0 refills | Status: DC

## 2021-10-11 NOTE — Progress Notes (Signed)
[facility] Med Check ? ?Patient ID: [name],  ?MRN: [id number] ? ?PCP: Etta Grandchild, MD ? ?Date of Evaluation: 10/11/2021 ?Time spent: ?30 minutes ?Chief Complaint:  ?Chief Complaint   ?Anxiety; Depression; ADHD; Follow-up; Medication Refill ?  ? ? ?HISTORY/CURRENT STATUS: ?HPI ? ?36 year old male presents to this office for initial visit and to establish care. Says Vyvanse is working well but his wife still thinks he is depressed. He says he feels more anxiety but now sure about depression. It has been a few years since he was last on any medication. He says that he would like to try Zoloft again. Says his anxiety is 6/10 today but has upcoming gastro bypass consultation he is worried about. Depression is 4/10. Says he is sleeping 7-8 hours per night. Denies mania, no psychosis, no SI/HI. ?  ?Previous psychiatric medications: ?Ritalin ?Vyvanse ?Adderall  ?Zoloft ? ? ?Individual Medical History/ Review of Systems: Changes? :No  ? ?Allergies: Patient has no known allergies. ? ?Current Medications:  ?Current Outpatient Medications:  ?  sertraline (ZOLOFT) 50 MG tablet, Take 1 tablet (50 mg total) by mouth daily., Disp: 50 tablet, Rfl: 1 ?  indapamide (LOZOL) 1.25 MG tablet, Take 1 tablet (1.25 mg total) by mouth daily., Disp: 90 tablet, Rfl: 0 ?  [START ON 10/25/2021] lisdexamfetamine (VYVANSE) 40 MG capsule, Take 1 capsule (40 mg total) by mouth every morning., Disp: 30 capsule, Rfl: 0 ?  montelukast (SINGULAIR) 10 MG tablet, TAKE 1 TABLET BY MOUTH AT  BEDTIME, Disp: 90 tablet, Rfl: 1 ?  testosterone cypionate (DEPOTESTOSTERONE CYPIONATE) 200 MG/ML injection, Inject 1 mL (200 mg total) into the muscle every 14 (fourteen) days., Disp: 10 mL, Rfl: 0 ?  valsartan (DIOVAN) 320 MG tablet, Take 1 tablet (320 mg total) by mouth daily., Disp: 90 tablet, Rfl: 1 ?Medication Side Effects: none ? ?Family Medical/ Social History: Changes? No ? ?MENTAL HEALTH EXAM: ? ?There were no vitals taken for this  visit.There is no height or weight on file to calculate BMI.  ?General Appearance: Casual, Neat, and Well Groomed  ?Eye Contact:  Good  ?Speech:  Clear and Coherent  ?Volume:  Normal  ?Mood:  Anxious and Depressed  ?Affect:  Appropriate, Depressed, and Anxious  ?Thought Process:  Coherent  ?Orientation:  Full (Time, Place, and Person)  ?Thought Content: Logical   ?Suicidal Thoughts:  No  ?Homicidal Thoughts:  No  ?Memory:  WNL  ?Judgement:  Good  ?Insight:  Good  ?Psychomotor Activity:  Normal  ?Concentration:  Concentration: Good  ?Recall:  Good  ?Fund of Knowledge: Good  ?Language: Good  ?Assets:  Desire for Improvement  ?ADL's:  Intact  ?Cognition: WNL  ?Prognosis:  Good  ? ? ?DIAGNOSES:  ?  ICD-10-CM   ?1. Generalized anxiety disorder  F41.1 sertraline (ZOLOFT) 50 MG tablet  ?  ?2. Major depressive disorder, recurrent episode, moderate (HCC)  F33.1 sertraline (ZOLOFT) 50 MG tablet  ?  ?3. Attention deficit hyperactivity disorder (ADHD), combined type  F90.2 lisdexamfetamine (VYVANSE) 40 MG capsule  ?  ? ? ?Receiving Psychotherapy: No  ? ? ?RECOMMENDATIONS:  ? ?Greater than 50% of  30 min face to face time with patient was spent on counseling and coordination of care. He says the Vyvanse is working well for him. Reinforced concerns about going any higher due to BP concerns. He says that he is monitoring and checking regularly.  ?We also discussed his concerns about being depressed with anxiety.  ?He has not been  on medication in a few years for A&D. ?Will continue Vyvanse 40 mg daily ?To start Zoloft 50 mg daily. He has been on Zoloft in the past with no side effects or problems.  ?To follow up in 4 weeks to reassess ?Will monitor BP and HR regularly and log to provide to PCP ?Will report side effects or worsening symptoms and seek medical attention immediately if BP rises above 180.  ?Discussed potential benefits, risks, and side effects of stimulants with patient to include increased heart rate, palpitations,  insomnia, increased anxiety, increased irritability, or decreased appetite.  Instructed patient to contact office if experiencing any significant tolerability issues.  ?Reviewed PDMP ? ? ?Joan Flores, NP  ?

## 2021-10-16 ENCOUNTER — Other Ambulatory Visit: Payer: Self-pay | Admitting: General Surgery

## 2021-10-16 ENCOUNTER — Telehealth: Payer: Self-pay | Admitting: Behavioral Health

## 2021-10-16 ENCOUNTER — Other Ambulatory Visit (HOSPITAL_COMMUNITY): Payer: Self-pay | Admitting: General Surgery

## 2021-10-16 NOTE — Telephone Encounter (Signed)
error 

## 2021-10-30 ENCOUNTER — Encounter: Payer: Self-pay | Admitting: Internal Medicine

## 2021-10-30 ENCOUNTER — Ambulatory Visit (INDEPENDENT_AMBULATORY_CARE_PROVIDER_SITE_OTHER): Payer: 59 | Admitting: Internal Medicine

## 2021-10-30 VITALS — BP 136/86 | HR 77 | Temp 98.8°F | Resp 16 | Ht 72.0 in | Wt 397.0 lb

## 2021-10-30 DIAGNOSIS — I10 Essential (primary) hypertension: Secondary | ICD-10-CM

## 2021-10-30 DIAGNOSIS — E230 Hypopituitarism: Secondary | ICD-10-CM

## 2021-10-30 LAB — CBC WITH DIFFERENTIAL/PLATELET
Basophils Absolute: 0.1 10*3/uL (ref 0.0–0.1)
Basophils Relative: 0.8 % (ref 0.0–3.0)
Eosinophils Absolute: 0.2 10*3/uL (ref 0.0–0.7)
Eosinophils Relative: 2.1 % (ref 0.0–5.0)
HCT: 41.4 % (ref 39.0–52.0)
Hemoglobin: 13.8 g/dL (ref 13.0–17.0)
Lymphocytes Relative: 41.4 % (ref 12.0–46.0)
Lymphs Abs: 3.6 10*3/uL (ref 0.7–4.0)
MCHC: 33.3 g/dL (ref 30.0–36.0)
MCV: 76.4 fl — ABNORMAL LOW (ref 78.0–100.0)
Monocytes Absolute: 0.5 10*3/uL (ref 0.1–1.0)
Monocytes Relative: 5.8 % (ref 3.0–12.0)
Neutro Abs: 4.4 10*3/uL (ref 1.4–7.7)
Neutrophils Relative %: 49.9 % (ref 43.0–77.0)
Platelets: 271 10*3/uL (ref 150.0–400.0)
RBC: 5.42 Mil/uL (ref 4.22–5.81)
RDW: 16.1 % — ABNORMAL HIGH (ref 11.5–15.5)
WBC: 8.8 10*3/uL (ref 4.0–10.5)

## 2021-10-30 NOTE — Progress Notes (Unsigned)
Subjective:  Patient ID: Frederick Snyder, male    DOB: July 23, 1985  Age: 36 y.o. MRN: [id number]  CC: No chief complaint on file.   HPI KACY MUNIER presents for ***  Outpatient Medications Prior to Visit  Medication Sig Dispense Refill   indapamide (LOZOL) 1.25 MG tablet Take 1 tablet (1.25 mg total) by mouth daily. 90 tablet 0   lisdexamfetamine (VYVANSE) 40 MG capsule Take 1 capsule (40 mg total) by mouth every morning. 30 capsule 0   montelukast (SINGULAIR) 10 MG tablet TAKE 1 TABLET BY MOUTH AT  BEDTIME 90 tablet 1   sertraline (ZOLOFT) 50 MG tablet Take 1 tablet (50 mg total) by mouth daily. 50 tablet 1   Testosterone Enanthate (XYOSTED) 75 MG/0.5ML SOAJ Inject into the skin.     valsartan (DIOVAN) 320 MG tablet Take 1 tablet (320 mg total) by mouth daily. 90 tablet 1   testosterone cypionate (DEPOTESTOSTERONE CYPIONATE) 200 MG/ML injection Inject 1 mL (200 mg total) into the muscle every 14 (fourteen) days. (Patient not taking: Reported on 10/30/2021) 10 mL 0   No facility-administered medications prior to visit.    ROS Review of Systems  Objective:  BP 136/86 (BP Location: Left Arm, Patient Position: Sitting, Cuff Size: Large)   Pulse 77   Temp 98.8 F (37.1 C) (Oral)   Resp 16   Ht 6' (1.829 m)   Wt (!) 397 lb (180.1 kg)   SpO2 97%   BMI 53.84 kg/m   BP Readings from Last 3 Encounters:  10/30/21 136/86  08/24/21 136/88  07/03/21 111/69    Wt Readings from Last 3 Encounters:  10/30/21 (!) 397 lb (180.1 kg)  08/24/21 (!) 398 lb (180.5 kg)  07/03/21 (!) 400 lb (181.4 kg)    Physical Exam  Lab Results  Component Value Date   WBC 9.0 08/24/2021   HGB 13.5 08/24/2021   HCT 40.4 08/24/2021   PLT 296.0 08/24/2021   GLUCOSE 94 08/24/2021   CHOL 215 (H) 09/07/2020   TRIG 204.0 (H) 05/03/2021   HDL 31.70 (L) 09/07/2020   LDLDIRECT 114.0 09/07/2020   ALT 17 09/07/2020   AST 13 09/07/2020   NA 137 08/24/2021   K 3.6 08/24/2021   CL 99  08/24/2021   CREATININE 0.97 08/24/2021   BUN 11 08/24/2021   CO2 29 08/24/2021   TSH 1.35 05/03/2021   HGBA1C 5.9 05/03/2021    MR Brain Wo Contrast  Result Date: 09/05/2021 CLINICAL DATA:  Headache, hyperprolactinemia EXAM: MRI HEAD WITHOUT CONTRAST TECHNIQUE: Multiplanar, multiecho pulse sequences of the brain and surrounding structures were obtained without intravenous contrast. COMPARISON:  None. FINDINGS: Brain: No restricted diffusion to suggest acute or subacute infarct. No acute hemorrhage, mass, mass effect, or midline shift. No hydrocephalus or extra-axial collection. Normal noncontrast appearance of the pituitary gland. The infundibulum is midline. Normal craniocervical junction. Vascular: Normal flow voids. Skull and upper cervical spine: Normal marrow signal. Sinuses/Orbits: Minimal mucosal thickening in the ethmoid air cells. Possible exophthalmos. The orbits are otherwise unremarkable. Other: Trace fluid in right mastoid air cells. IMPRESSION: No acute intracranial process. No etiology seen for the patient's headache. Normal noncontrast appearance of the pituitary gland. Electronically Signed   By: Merilyn Baba M.D.   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 Font's testosterone cypionate. I am also having  him maintain his montelukast, valsartan, indapamide, sertraline, lisdexamfetamine, and Xyosted.  No orders of the defined types were placed in this encounter.    Follow-up: No follow-ups on file.  Scarlette Calico, MD

## 2021-10-30 NOTE — Patient Instructions (Signed)
Hypertension, Adult High blood pressure (hypertension) is when the force of blood pumping through the arteries is too strong. The arteries are the blood vessels that carry blood from the heart throughout the body. Hypertension forces the heart to work harder to pump blood and may cause arteries to become narrow or stiff. Untreated or uncontrolled hypertension can lead to a heart attack, heart failure, a stroke, kidney disease, and other problems. A blood pressure reading consists of a higher number over a lower number. Ideally, your blood pressure should be below 120/80. The first ("top") number is called the systolic pressure. It is a measure of the pressure in your arteries as your heart beats. The second ("bottom") number is called the diastolic pressure. It is a measure of the pressure in your arteries as the heart relaxes. What are the causes? The exact cause of this condition is not known. There are some conditions that result in high blood pressure. What increases the risk? Certain factors may make you more likely to develop high blood pressure. Some of these risk factors are under your control, including: Smoking. Not getting enough exercise or physical activity. Being overweight. Having too much fat, sugar, calories, or salt (sodium) in your diet. Drinking too much alcohol. Other risk factors include: Having a personal history of heart disease, diabetes, high cholesterol, or kidney disease. Stress. Having a family history of high blood pressure and high cholesterol. Having obstructive sleep apnea. Age. The risk increases with age. What are the signs or symptoms? High blood pressure may not cause symptoms. Very high blood pressure (hypertensive crisis) may cause: Headache. Fast or irregular heartbeats (palpitations). Shortness of breath. Nosebleed. Nausea and vomiting. Vision changes. Severe chest pain, dizziness, and seizures. How is this diagnosed? This condition is diagnosed by  measuring your blood pressure while you are seated, with your arm resting on a flat surface, your legs uncrossed, and your feet flat on the floor. The cuff of the blood pressure monitor will be placed directly against the skin of your upper arm at the level of your heart. Blood pressure should be measured at least twice using the same arm. Certain conditions can cause a difference in blood pressure between your right and left arms. If you have a high blood pressure reading during one visit or you have normal blood pressure with other risk factors, you may be asked to: Return on a different day to have your blood pressure checked again. Monitor your blood pressure at home for 1 week or longer. If you are diagnosed with hypertension, you may have other blood or imaging tests to help your health care provider understand your overall risk for other conditions. How is this treated? This condition is treated by making healthy lifestyle changes, such as eating healthy foods, exercising more, and reducing your alcohol intake. You may be referred for counseling on a healthy diet and physical activity. Your health care provider may prescribe medicine if lifestyle changes are not enough to get your blood pressure under control and if: Your systolic blood pressure is above 130. Your diastolic blood pressure is above 80. Your personal target blood pressure may vary depending on your medical conditions, your age, and other factors. Follow these instructions at home: Eating and drinking  Eat a diet that is high in fiber and potassium, and low in sodium, added sugar, and fat. An example of this eating plan is called the DASH diet. DASH stands for Dietary Approaches to Stop Hypertension. To eat this way: Eat   plenty of fresh fruits and vegetables. Try to fill one half of your plate at each meal with fruits and vegetables. Eat whole grains, such as whole-wheat pasta, brown rice, or whole-grain bread. Fill about one  fourth of your plate with whole grains. Eat or drink low-fat dairy products, such as skim milk or low-fat yogurt. Avoid fatty cuts of meat, processed or cured meats, and poultry with skin. Fill about one fourth of your plate with lean proteins, such as fish, chicken without skin, beans, eggs, or tofu. Avoid pre-made and processed foods. These tend to be higher in sodium, added sugar, and fat. Reduce your daily sodium intake. Many people with hypertension should eat less than 1,500 mg of sodium a day. Do not drink alcohol if: Your health care provider tells you not to drink. You are pregnant, may be pregnant, or are planning to become pregnant. If you drink alcohol: Limit how much you have to: 0-1 drink a day for women. 0-2 drinks a day for men. Know how much alcohol is in your drink. In the [place] one drink equals one 12 oz bottle of beer (355 mL), one 5 oz glass of wine (148 mL), or one 1 oz glass of hard liquor (44 mL). Lifestyle  Work with your health care provider to maintain a healthy body weight or to lose weight. Ask what an ideal weight is for you. Get at least 30 minutes of exercise that causes your heart to beat faster (aerobic exercise) most days of the week. Activities may include walking, swimming, or biking. Include exercise to strengthen your muscles (resistance exercise), such as Pilates or lifting weights, as part of your weekly exercise routine. Try to do these types of exercises for 30 minutes at least 3 days a week. Do not use any products that contain nicotine or tobacco. These products include cigarettes, chewing tobacco, and vaping devices, such as e-cigarettes. If you need help quitting, ask your health care provider. Monitor your blood pressure at home as told by your health care provider. Keep all follow-up visits. This is important. Medicines Take over-the-counter and prescription medicines only as told by your health care provider. Follow directions carefully. Blood  pressure medicines must be taken as prescribed. Do not skip doses of blood pressure medicine. Doing this puts you at risk for problems and can make the medicine less effective. Ask your health care provider about side effects or reactions to medicines that you should watch for. Contact a health care provider if you: Think you are having a reaction to a medicine you are taking. Have headaches that keep coming back (recurring). Feel dizzy. Have swelling in your ankles. Have trouble with your vision. Get help right away if you: Develop a severe headache or confusion. Have unusual weakness or numbness. Feel faint. Have severe pain in your chest or abdomen. Vomit repeatedly. Have trouble breathing. These symptoms may be an emergency. Get help right away. Call 911. Do not wait to see if the symptoms will go away. Do not drive yourself to the hospital. Summary Hypertension is when the force of blood pumping through your arteries is too strong. If this condition is not controlled, it may put you at risk for serious complications. Your personal target blood pressure may vary depending on your medical conditions, your age, and other factors. For most people, a normal blood pressure is less than 120/80. Hypertension is treated with lifestyle changes, medicines, or a combination of both. Lifestyle changes include losing weight, eating a healthy,   low-sodium diet, exercising more, and limiting alcohol. This information is not intended to replace advice given to you by your health care provider. Make sure you discuss any questions you have with your health care provider. Document Revised: 04/04/2021 Document Reviewed: 04/04/2021 Elsevier Patient Education  2023 [employer]vier Inc.  

## 2021-10-31 LAB — TESTOSTERONE TOTAL,FREE,BIO, MALES
Albumin: 4.1 g/dL (ref 3.6–5.1)
Sex Hormone Binding: 14 nmol/L (ref 10–50)
Testosterone: 191 ng/dL — ABNORMAL LOW (ref 250–827)

## 2021-10-31 MED ORDER — XYOSTED 100 MG/0.5ML ~~LOC~~ SOAJ: 1.0000 | SUBCUTANEOUS | 1 refills | Status: DC

## 2021-11-08 ENCOUNTER — Encounter: Payer: Self-pay | Admitting: Behavioral Health

## 2021-11-08 ENCOUNTER — Ambulatory Visit (INDEPENDENT_AMBULATORY_CARE_PROVIDER_SITE_OTHER): Payer: 59 | Admitting: Behavioral Health

## 2021-11-08 DIAGNOSIS — F411 Generalized anxiety disorder: Secondary | ICD-10-CM

## 2021-11-08 DIAGNOSIS — F331 Major depressive disorder, recurrent, moderate: Secondary | ICD-10-CM

## 2021-11-08 DIAGNOSIS — F902 Attention-deficit hyperactivity disorder, combined type: Secondary | ICD-10-CM

## 2021-11-08 MED ORDER — LISDEXAMFETAMINE DIMESYLATE 50 MG PO CAPS: 50.0000 mg | ORAL_CAPSULE | Freq: Every day | ORAL | 0 refills | Status: DC

## 2021-11-08 NOTE — Progress Notes (Signed)
[facility]  Patient ID: Frederick Snyder,  MRN: [id number]  PCP: Etta Grandchild, MD  Date of Evaluation: 11/08/2021 Time spent:30 minutes  Chief Complaint:  Chief Complaint   Anxiety; Depression; ADHD; Follow-up; Medication Refill; Medication Problem     HISTORY/CURRENT STATUS: HPI 36 year old male presents to this office for initial visit and to establish care. Says Vyvanse is working well but attention and focus becomes a problem in the afternoon before the end of the work day. Feels like Zoloft is working well to assist with his recent anxiety and depression. He feels that it is at a manageable level.  It has been a few years since he was last on any medication. He says that he would like to try Zoloft again. Says his anxiety is 3/10 today but has upcoming gastro bypass consultation he is worried about. Depression is 3/10. Says he is sleeping 7-8 hours per night. Denies mania, no psychosis, no SI/HI.   Previous psychiatric medications: Ritalin Vyvanse Adderall  Zoloft   Individual Medical History/ Review of Systems: Changes? :No   Allergies: Patient has no known allergies.  Current Medications:  Current Outpatient Medications:    [START ON 11/25/2021] lisdexamfetamine (VYVANSE) 50 MG capsule, Take 1 capsule (50 mg total) by mouth daily., Disp: 30 capsule, Rfl: 0   indapamide (LOZOL) 1.25 MG tablet, Take 1 tablet (1.25 mg total) by mouth daily., Disp: 90 tablet, Rfl: 0   lisdexamfetamine (VYVANSE) 40 MG capsule, Take 1 capsule (40 mg total) by mouth every morning., Disp: 30 capsule, Rfl: 0   montelukast (SINGULAIR) 10 MG tablet, TAKE 1 TABLET BY MOUTH AT  BEDTIME, Disp: 90 tablet, Rfl: 1   sertraline (ZOLOFT) 50 MG tablet, Take 1 tablet (50 mg total) by mouth daily., Disp: 50 tablet, Rfl: 1   Testosterone Enanthate (XYOSTED) 100 MG/0.5ML SOAJ, Inject 1 Act into the skin once a week., Disp: 6 mL, Rfl: 1   valsartan (DIOVAN) 320 MG tablet, Take 1 tablet (320 mg  total) by mouth daily., Disp: 90 tablet, Rfl: 1 Medication Side Effects: none  Family Medical/ Social History: Changes? No  MENTAL HEALTH EXAM:  There were no vitals taken for this visit.There is no height or weight on file to calculate BMI.  General Appearance: Casual, Neat, and Well Groomed  Eye Contact:  Good  Speech:  Clear and Coherent  Volume:  Normal  Mood:  NA  Affect:  Appropriate  Thought Process:  Coherent  Orientation:  Full (Time, Place, and Person)  Thought Content: Logical   Suicidal Thoughts:  No  Homicidal Thoughts:  No  Memory:  WNL  Judgement:  Good  Insight:  Good  Psychomotor Activity:  Normal  Concentration:  Concentration: Good  Recall:  Good  Fund of Knowledge: Good  Language: Good  Assets:  Desire for Improvement  ADL's:  Intact  Cognition: WNL  Prognosis:  Good    DIAGNOSES:    ICD-10-CM   1. Attention deficit hyperactivity disorder (ADHD), combined type  F90.2 lisdexamfetamine (VYVANSE) 50 MG capsule    2. Generalized anxiety disorder  F41.1     3. Major depressive disorder, recurrent episode, moderate (HCC)  F33.1       Receiving Psychotherapy: No    RECOMMENDATIONS:   Greater than 50% of  30 min face to face time with patient was spent on counseling and coordination of care. He says the Vyvanse is working well for him but still getting some drop off during the afternoon  before the end of his work day. He says that he is monitoring and checking BP regularly.  We also discussed his concerns about being depressed with anxiety.  He has not been on medication in a few years for A&D. Increase Vyvanse to 50 mg daily Continue Zoloft 50 mg daily. He has been on Zoloft in the past with no side effects or problems.  To follow up in 8 weeks to reassess Will monitor BP and HR regularly and log to provide to PCP Will report side effects or worsening symptoms and seek medical attention immediately if BP rises above 180.  Discussed potential  benefits, risks, and side effects of stimulants with patient to include increased heart rate, palpitations, insomnia, increased anxiety, increased irritability, or decreased appetite.  Instructed patient to contact office if experiencing any significant tolerability issues.  Reviewed PDMP            Joan Flores, NP

## 2021-11-13 ENCOUNTER — Ambulatory Visit (HOSPITAL_COMMUNITY)
Admission: RE | Admit: 2021-11-13 | Discharge: 2021-11-13 | Disposition: A | Discharge status: Home / Self Care | Payer: 59 | Source: Ambulatory Visit | Attending: General Surgery | Admitting: General Surgery

## 2021-11-13 ENCOUNTER — Other Ambulatory Visit: Payer: Self-pay

## 2021-11-13 DIAGNOSIS — Z01818 Encounter for other preprocedural examination: Secondary | ICD-10-CM | POA: Diagnosis present

## 2021-11-20 ENCOUNTER — Ambulatory Visit: Payer: 59 | Admitting: Dietician

## 2021-11-24 ENCOUNTER — Other Ambulatory Visit: Payer: Self-pay | Admitting: Behavioral Health

## 2021-11-24 DIAGNOSIS — F902 Attention-deficit hyperactivity disorder, combined type: Secondary | ICD-10-CM

## 2021-11-27 ENCOUNTER — Ambulatory Visit: Payer: 59 | Admitting: Internal Medicine

## 2021-11-27 ENCOUNTER — Telehealth: Payer: Self-pay | Admitting: Internal Medicine

## 2021-11-27 DIAGNOSIS — I10 Essential (primary) hypertension: Secondary | ICD-10-CM

## 2021-11-27 MED ORDER — VALSARTAN 320 MG PO TABS: 320.0000 mg | ORAL_TABLET | Freq: Every day | ORAL | 1 refills | Status: DC

## 2021-11-27 MED ORDER — INDAPAMIDE 1.25 MG PO TABS: 1.2500 mg | ORAL_TABLET | Freq: Every day | ORAL | 1 refills | Status: DC

## 2021-11-27 NOTE — Telephone Encounter (Signed)
Pt called in requesting a refill on valsartan (DIOVAN) 320 MG tablet and indapamide (LOZOL) 1.25 MG tablet  Pharmacy: [facility] - [place], [place] - [address] Phone:  [phone]  Fax:  [phone]

## 2021-12-11 ENCOUNTER — Encounter: Payer: 59 | Attending: General Surgery | Admitting: Dietician

## 2021-12-11 ENCOUNTER — Encounter: Payer: Self-pay | Admitting: Dietician

## 2021-12-11 NOTE — Progress Notes (Addendum)
Nutrition Assessment for Bariatric Surgery Medical Nutrition Therapy  Patient was seen on 12/11/2021 for Pre-Operative Nutrition Assessment. Letter of approval faxed to [facility] bariatric surgery program coordinator on 12/11/2021.   Referral stated Supervised Weight Loss (SWL) visits needed: 0  Pt completed visits.   Pt has cleared nutrition requirements.   Planned surgery: RYGB Pt expectation of surgery: lose as much weight as I can Pt expectation of dietitian:    NUTRITION ASSESSMENT   Anthropometrics  Start weight at [facility]: 384.2 lbs (date: 12/11/2021)  Height: 72 in BMI: 52.11 kg/m2     Clinical  Medical hx: Asthma, hyperlipidemia, HTN, Sleep Apnea Medications: Valsartan, Vyvanse, Indapamide, Xyosted, Sertraline, Singular, Ozempic  Labs: Vit D 17.73 (7 months ago) Notable signs/symptoms: none noted Any previous deficiencies? No  Micronutrient Nutrition Focused Physical Exam: Hair: No issues observed Eyes: No issues observed Mouth: No issues observed Neck: No issues observed Nails: No issues observed Skin: No issues observed  Lifestyle & Dietary Hx  Patient states he lives with his wife and stepdaughter. Patient's wife performs the food shopping and prepares the meals. He reports that he typically skips or misses 7-10 out of 21 possible meals per week. He may have 2-3 meals per week that are take-out or at a restaurant. At home he may have steak and mashed potatoes or chicken and rice. He snacks between meals on bag of chips, or fruit chews. Pt states he drinks water at work and soda in the evening at home.  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 Pt denies binge eating and denies shame and/or guilt after eating too much food.  He denies having used laxatives or vomiting to facilitate weight loss. He denies emotional eating during times of stress. He states that he knows the difference between satisfaction and fullness.   24-Hr  Dietary Recall First Meal: skip Snack: bag of chips once in awhile Second Meal: pizza or sandwich with fries Snack:  Third Meal: chicken and rice or steak and mashed potatoes or beef Snack: fruit chews or candies Beverages: water, coffee (8-12 oz), soda    NUTRITION DIAGNOSIS  Overweight/obesity ([place]-3.3) related to past poor dietary habits and physical inactivity as evidenced by patient w/ planned RYGB surgery following dietary guidelines for continued weight loss.    NUTRITION INTERVENTION  Nutrition counseling (C-1) and education (E-2) to facilitate bariatric surgery goals.  Educated pt on micronutrient deficiencies post surgery and strategies to mitigate that risk   Pre-Op Goals Reviewed with the Patient Track food and beverage intake (pen and paper, MyFitness Pal, Baritastic app, etc.) Make healthy food choices while monitoring portion sizes Consume 3 meals per day or try to eat every 3-5 hours Avoid concentrated sugars and fried foods Keep sugar & fat in the single digits per serving on food labels Practice CHEWING your food (aim for applesauce consistency) Practice not drinking 15 minutes before, during, and 30 minutes after each meal and snack Avoid all carbonated beverages (ex: soda, sparkling beverages)  Limit caffeinated beverages (ex: coffee, tea, energy drinks) Avoid all sugar-sweetened beverages (ex: regular soda, sports drinks)  Avoid alcohol  Aim for 64-100 ounces of FLUID daily (with at least half of fluid intake being plain water)  Aim for at least 60-80 grams of PROTEIN daily Look for a liquid protein source that contains ?15 g protein and ?5 g carbohydrate (ex: shakes, drinks, shots) Make a list of non-food related activities Physical activity is an important part of  a healthy lifestyle so keep it moving! The goal is to reach 150 minutes of exercise per week, including cardiovascular and weight baring activity.  *Goals that are bolded indicate the pt would like  to start working towards these  Handouts Provided Include  Bariatric Surgery handouts (Nutrition Visits, Pre-Op Goals, Protein Shakes, Vitamins & Minerals) Meal Ideas handout  Learning Style & Readiness for Change Teaching method utilized: Visual & Auditory  Demonstrated degree of understanding via: Teach Back  Readiness Level: preparation Barriers to learning/adherence to lifestyle change: none identified  RD's Notes for Next Visit     MONITORING & EVALUATION Dietary intake, weekly physical activity, body weight, and pre-op goals reached at next nutrition visit.    Next Steps  Pt has completed visits. No further supervised visits required/recomended  Patient is to follow up at [facility] for Pre-Op Class >2 weeks before surgery for further nutrition education.

## 2021-12-25 ENCOUNTER — Other Ambulatory Visit: Payer: Self-pay | Admitting: Behavioral Health

## 2021-12-25 DIAGNOSIS — F902 Attention-deficit hyperactivity disorder, combined type: Secondary | ICD-10-CM

## 2021-12-25 NOTE — Telephone Encounter (Signed)
Filled 6/17 appt 7/31

## 2021-12-28 ENCOUNTER — Other Ambulatory Visit: Payer: Self-pay | Admitting: Behavioral Health

## 2021-12-28 DIAGNOSIS — F411 Generalized anxiety disorder: Secondary | ICD-10-CM

## 2021-12-28 DIAGNOSIS — F331 Major depressive disorder, recurrent, moderate: Secondary | ICD-10-CM

## 2022-01-08 ENCOUNTER — Encounter: Payer: Self-pay | Admitting: Behavioral Health

## 2022-01-08 ENCOUNTER — Ambulatory Visit (INDEPENDENT_AMBULATORY_CARE_PROVIDER_SITE_OTHER): Payer: 59 | Admitting: Behavioral Health

## 2022-01-08 DIAGNOSIS — F411 Generalized anxiety disorder: Secondary | ICD-10-CM

## 2022-01-08 DIAGNOSIS — F331 Major depressive disorder, recurrent, moderate: Secondary | ICD-10-CM

## 2022-01-08 DIAGNOSIS — F902 Attention-deficit hyperactivity disorder, combined type: Secondary | ICD-10-CM

## 2022-01-08 MED ORDER — LISDEXAMFETAMINE DIMESYLATE 50 MG PO CAPS: 50.0000 mg | ORAL_CAPSULE | Freq: Every day | ORAL | 0 refills | Status: DC

## 2022-01-08 MED ORDER — SERTRALINE HCL 50 MG PO TABS: 50.0000 mg | ORAL_TABLET | Freq: Every day | ORAL | 3 refills | Status: DC

## 2022-01-08 NOTE — Progress Notes (Signed)
[facility]  Patient ID: Frederick Snyder,  MRN: [id number]  PCP: Etta Grandchild, MD  Date of Evaluation: 01/08/2022 Time spent:20 minutes  Chief Complaint:  Chief Complaint   Anxiety; Depression; Follow-up; Medication Refill; ADHD     HISTORY/CURRENT STATUS: HPI  36 year old male presents to this office for initial visit and to establish care.  Feels like Zoloft is working well to assist with his recent anxiety and depression. He feels that it is at a manageable level.  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 Requesting no medication changes this visit. Says his anxiety is 3/10 today but has upcoming gastro bypass consultation he is worried about. Depression is 3/10. Says he is sleeping 7-8 hours per night. Denies mania, no psychosis, no SI/HI.   Previous psychiatric medications: Ritalin Vyvanse Adderall  Zoloft    Individual Medical History/ Review of Systems: Changes? :No   Allergies: Patient has no known allergies.  Current Medications:  Current Outpatient Medications:    indapamide (LOZOL) 1.25 MG tablet, Take 1 tablet (1.25 mg total) by mouth daily., Disp: 90 tablet, Rfl: 1   montelukast (SINGULAIR) 10 MG tablet, TAKE 1 TABLET BY MOUTH AT  BEDTIME, Disp: 90 tablet, Rfl: 1   sertraline (ZOLOFT) 50 MG tablet, Take 1 tablet (50 mg total) by mouth daily., Disp: 30 tablet, Rfl: 1   Testosterone Enanthate (XYOSTED) 100 MG/0.5ML SOAJ, Inject 1 Act into the skin once a week., Disp: 6 mL, Rfl: 1   valsartan (DIOVAN) 320 MG tablet, Take 1 tablet (320 mg total) by mouth daily., Disp: 90 tablet, Rfl: 1   VYVANSE 50 MG capsule, Take 1 capsule (50 mg total) by mouth daily., Disp: 30 capsule, Rfl: 0 Medication Side Effects: none  Family Medical/ Social History: Changes? No  MENTAL HEALTH EXAM:  There were no vitals taken for this visit.There is no height or weight on file to calculate BMI.  General Appearance: Casual, Neat, and Well  Groomed  Eye Contact:  Good  Speech:  Clear and Coherent  Volume:  Normal  Mood:  NA  Affect:  Appropriate  Thought Process:  Coherent  Orientation:  Full (Time, Place, and Person)  Thought Content: Logical   Suicidal Thoughts:  No  Homicidal Thoughts:  No  Memory:  WNL  Judgement:  Good  Insight:  Good  Psychomotor Activity:  Normal  Concentration:  Concentration: Good  Recall:  Good  Fund of Knowledge: Good  Language: Good  Assets:  Desire for Improvement  ADL's:  Intact  Cognition: WNL  Prognosis:  Good    DIAGNOSES:    ICD-10-CM   1. Attention deficit hyperactivity disorder (ADHD), combined type  F90.2     2. Generalized anxiety disorder  F41.1     3. Major depressive disorder, recurrent episode, moderate (HCC)  F33.1       Receiving Psychotherapy: No    RECOMMENDATIONS:   Greater than 50% of 20 min face to face time with patient was spent on counseling and coordination of care. He agrees that medications are working good for him at this time. He does not want to make any medication changes right now. He is very excited due to promotion at work.   Continue Vyvanse to 50 mg daily Continue Zoloft 50 mg daily.  To follow up in 3 months to reassess Will monitor BP and HR regularly and log to provide to PCP Will report side effects or worsening symptoms and  seek medical attention immediately if BP rises above 180.  Discussed potential benefits, risks, and side effects of stimulants with patient to include increased heart rate, palpitations, insomnia, increased anxiety, increased irritability, or decreased appetite.  Instructed patient to contact office if experiencing any significant tolerability issues.  Reviewed PDMP   Joan Flores, NP

## 2022-01-22 ENCOUNTER — Ambulatory Visit (INDEPENDENT_AMBULATORY_CARE_PROVIDER_SITE_OTHER): Payer: 59 | Admitting: Licensed Clinical Social Worker

## 2022-01-22 DIAGNOSIS — F902 Attention-deficit hyperactivity disorder, combined type: Secondary | ICD-10-CM | POA: Diagnosis not present

## 2022-01-22 DIAGNOSIS — F411 Generalized anxiety disorder: Secondary | ICD-10-CM | POA: Diagnosis not present

## 2022-01-23 NOTE — Progress Notes (Signed)
Comprehensive Clinical Assessment (CCA) Note  01/23/2022 RAJON BISIG [id number]  Chief Complaint:  Chief Complaint  Patient presents with   Obesity   Visit Diagnosis: Generalized anxiety disorder  Attention deficit hyperactivity disorder (ADHD), combined type     CCA Biopsychosocial Intake/Chief Complaint:  Bariatric  Current Symptoms/Problems: Anxiety: occasional worry about health, sleeps about 4-6 hours a night   Patient Reported Schizophrenia/Schizoaffective Diagnosis in Past: No   Strengths: intelligent, stay focused, take care of family, hard worker  Preferences: prefers staying at home with family  Abilities: great with excel, negotiation, conflict resolution, firearms Nature conservation officer, Patent examiner)   Type of Services Patient Feels are Needed: Bariatric   Initial Clinical Notes/Concerns: History of obesity: Heavy since childhood for the most part, had a few years during and after highschool that it was not an issue but it gradually increased,   Family history of obesity: Both parents were overweight/obese and had the procedure,  Diet: portion control, reduced soda and increased water,  Weightloss attempts: exercise, weightloss medication: federmine,  Co-Morbid: hypertension, Sleep apnea, asthma,    Procedures: tubes in ears as a child, orthoscopic knee surgery: 2004, hernia surgery:2022-recovered well from procedures   Mental Health Symptoms Depression:  None   Duration of Depressive symptoms: No data recorded  Mania:  None   Anxiety:   Worrying; Sleep   Psychosis:  None   Duration of Psychotic symptoms: No data recorded  Trauma:  None   Obsessions:  None   Compulsions:  None   Inattention:  None   Hyperactivity/Impulsivity:  None   Oppositional/Defiant Behaviors:  None   Emotional Irregularity:  None   Other Mood/Personality Symptoms:   None    Mental Status Exam Appearance and self-care  Stature:  Average   Weight:  Obese    Clothing:  Casual   Grooming:  Normal   Cosmetic use:  None   Posture/gait:  Normal   Motor activity:  Not Remarkable   Sensorium  Attention:  Normal   Concentration:  Normal   Orientation:  X5   Recall/memory:  Normal   Affect and Mood  Affect:  Appropriate   Mood:  Euthymic   Relating  Eye contact:  Normal   Facial expression:  Responsive   Attitude toward examiner:  Cooperative   Thought and Language  Speech flow: Normal   Thought content:  Appropriate to Mood and Circumstances   Preoccupation:  None   Hallucinations:  None   Organization:  No data recorded  [employer] of Knowledge:  Average   Intelligence:  Average   Abstraction:  Normal   Judgement:  Good   Reality Testing:  Realistic   Insight:  Good   Decision Making:  Normal   Social Functioning  Social Maturity:  Responsible   Social Judgement:  Normal   Stress  Stressors:  Illness   Coping Ability:  Normal   Skill Deficits:  None   Supports:  Family     Religion: Religion/Spirituality Are You A Religious Person?: Yes What is Your Religious Affiliation?: Christian How Might This Affect Treatment?: Support  Leisure/Recreation: Leisure / Recreation Do You Have Hobbies?: Yes Leisure and Hobbies: play pool, read, spend time with family  Exercise/Diet: Exercise/Diet Do You Exercise?: Yes What Type of Exercise Do You Do?: Run/Walk How Many Times a Week Do You Exercise?: 1-3 times a week Have You Gained or Lost A Significant Amount of Weight in the Past Six Months?: Yes-Lost Number of Pounds Lost?: 40  Do You Follow a Special Diet?: Yes Type of Diet: portion control Do You Have Any Trouble Sleeping?: Yes (CPAP) Explanation of Sleeping Difficulties: Thinks about work, trouble falling asleep   CCA Employment/Education Employment/Work Situation: Employment / Work Situation Employment Situation: Employed Where is Patient Currently Employed?: [employer] Long has Patient Been Employed?: 3 years Are You Satisfied With Your Job?: Yes Do You Work More Than One Job?: No Work Stressors: None Patient's Job has Been Impacted by Current Illness: No What is the Longest Time Patient has Held a Job?: 3 years Where was the Patient Employed at that 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?: Yes (Describe in comment) Engineer, petroleum, 6 years) Did You Receive Any Psychiatric Treatment/Services While in the [employer]?: No  Education: Education Is Patient Currently Attending School?: Yes School Currently Attending: [employer] Last Grade Completed: 12 Name of High School: [employer] Did Garment/textile technologist From [employer]?: Yes Did You Attend College?: Yes What Type of College Degree Do you Have?: [employer] Did [place] Attend Graduate School?: Yes What is Your Post Graduate Degree?: Masters: Publishing rights manager Was Your Major?: Business Admin Did You Have Any Scientist, research (life sciences) In School?: English, Business Did You Have An Individualized Education Program (IIEP): No Did You Have Any Difficulty At [employer]?: Yes Were Any Medications Ever Prescribed For These Difficulties?: No Patient's Education Has Been Impacted by Current Illness: No   CCA Family/Childhood History Family and Relationship History: Family history Marital status: Married Number of Years Married: 11 What types of issues is patient dealing with in the relationship?: None Additional relationship information: None Are you sexually active?: Yes What is your sexual orientation?: Heterosexual Has your sexual activity been affected by drugs, alcohol, medication, or emotional stress?: None Does patient have children?: Yes How many children?: 2 How is patient's relationship with their children?: Step daughters: good relationship  Childhood History:  Childhood History By whom was/is the patient  raised?: Both parents Additional childhood history information: Both parents in the home. Patient describes childhood as "good." Description of patient's relationship with caregiver when they were a child: Mother: good, Father: good Patient's description of current relationship with people who raised him/her: Mother: deceased, Father: deceased How were you disciplined when you got in trouble as a child/adolescent?: spanked Does patient have siblings?: Yes Number of Siblings: 1 Description of patient's current relationship with siblings: Younger brother: good Did patient suffer any verbal/emotional/physical/sexual abuse as a child?: No Did patient suffer from severe childhood neglect?: No Has patient ever been sexually abused/assaulted/raped as an adolescent or adult?: No Was the patient ever a victim of a crime or a disaster?: No Witnessed domestic violence?: No Has patient been affected by domestic violence as an adult?: No  Child/Adolescent Assessment:     CCA Substance Use Alcohol/Drug Use: Alcohol / Drug Use Pain Medications: See patient MAR Prescriptions: See patient MAR Over the Counter: See patient MAR History of alcohol / drug use?: No history of alcohol / drug abuse                         ASAM's:  Six Dimensions of Multidimensional Assessment  Dimension 1:  Acute Intoxication and/or Withdrawal Potential:   Dimension 1:  Description of individual's past and current experiences of substance use and withdrawal: See patient MAR  Dimension 2:  Biomedical Conditions and Complications:   Dimension 2:  Description of  patient's biomedical conditions and  complications: See patient MAR  Dimension 3:  Emotional, Behavioral, or Cognitive Conditions and Complications:  Dimension 3:  Description of emotional, behavioral, or cognitive conditions and complications: See patient MAR  Dimension 4:  Readiness to Change:  Dimension 4:  Description of Readiness to Change criteria:  See patient MAR  Dimension 5:  Relapse, Continued use, or Continued Problem Potential:  Dimension 5:  Relapse, continued use, or continued problem potential critiera description: See patient MAR  Dimension 6:  Recovery/Living Environment:  Dimension 6:  Recovery/Iiving environment criteria description: See patient MAR  ASAM Severity Score: ASAM's Severity Rating Score: 0  ASAM Recommended Level of Treatment:     Substance use Disorder (SUD)    Recommendations for Services/Supports/Treatments: Recommendations for Services/Supports/Treatments Recommendations For Services/Supports/Treatments:  (Bariatric)  DSM5 Diagnoses: Patient Active Problem List   Diagnosis Date Noted   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) 08/30/2021   Pure hypertriglyceridemia 05/03/2021   OSA on CPAP 05/03/2021   Prediabetes 05/03/2021   Primary hypertension 09/07/2020   Morbid obesity with BMI of 50.0-59.9, adult (HCC) 09/07/2020   Loud snoring 09/07/2020   Encounter for general adult medical examination with abnormal findings 09/07/2020   Vitamin D deficiency disease 09/07/2020   OCD (obsessive compulsive disorder) 04/06/2018   Attention deficit hyperactivity disorder (ADHD) 04/06/2018   Hyperprolactinemia (HCC) 02/26/2018   Asthma 05/25/2009    Patient Centered Plan: Patient is on the following Treatment Plan(s):  No treatment plan needed  Behavioral Health Assessment Summary:    Behavioral Health Assessment Patient Name Carlitos Bottino Date of Birth Jul 16, 1985  Age 36 Date of Interview 08.14.2023  Gender Male Date of Report 08.15.2023  Purpose Bariatric/Weight-loss Surgery (pre-operative evaluation)        Assessment Instruments:  DSM-5-TR Self-Rated Level 1 Cross-Cutting Symptom Measure--Adult Severity Measure for Generalized Anxiety Disorder--Adult EAT-26  Chief Complain: Obesity  Client Background: Patient is a 36 year old Caucasian male seeking weight loss surgery. Patient has a  bachelors degree and currently working on a Manufacturing engineer.. Patient is married and has two stepchildren. The patient is 6 feet 0 inches tall and 380 lbs., placing her at a BMI of 51.5 classifying him in the obese range and at further risk of co-morbid diseases.   Weight History: Patient has been heavy most of his life but his weight has gradually increased since finishing school. Patient has tried exercise, weight loss medication, and phentermine,   Eating Patterns: Patient is focusing on portion control, reduced his soda and increased his water intake.  Related Medical Issues:   Patient has been diagnosed with hypertension and sleep apnea.   Family History of Obesity:   Patient's parents were both obese. They both had a bariatric procedure and lost weight.    Tobacco Use: Patient denies tobacco use.    PATIENT BEHAVIORAL ASSESSMENT SCORES   Personal History of Mental Illness: Patient admits to treatment for depression, anxiety, and OCD in the past. His anxiety is stable with medication and he no longer engages in OCD behaviors/rituals.    Mental Status Examination: Patient was oriented x5 (person, place, situation, time, and object). He was appropriately groomed, and neatly dressed. Patient was alert, engaged, pleasant, and cooperative. Patient denies suicidal and homicidal ideations. Patient denies self-injury. Patient denies psychosis including auditory and visual hallucinations   DSM-5-TR Self-Rated Level 1 Cross-Cutting Symptom Measure--Adult: Patient rated himself a 3 on the Bipolar/Manic domain indicating "moderate" presence of symptoms over the last 2 weeks. Patient has reduced sleep to  due his job, and started more projects than usual lately due to getting a raise and being able to afford to complete more home projects.  These  symptoms are more related to ADHD rather than a mood disorder.    Severity Measure for Generalized Anxiety Disorder--Adult: Patient completed a 10-question  scale. Total scores can range from 0 to 40. A raw score is calculated by summing the answer to each question, and an average total score is achieved by dividing the raw score by the number of items (e.g., 10). Patient had a total raw score of 5 out of 40 which was divided by the total number of questions answered (10) to get an average score of .5 which when rounded up to a score of 1 indicates minimal anxiety. Patient is managing his anxiety with medication and his current stressors are his health.    EAT-26: The EAT-26 is a twenty-six-question screening tool to identify symptoms of eating disorders and disordered eating. The patient scored 9 out of 26. Scores below a 20 are considered not meeting criteria for disordered eating. Patient denies inducing vomiting, or intentional meal skipping. Patient denies binge eating behaviors. Patient denies laxative abuse. Patient does not meet criteria for a DSM-V eating disorder.   Conclusion & Recommendations:   Johnatha Zeidman. Schoenberger's mental health history and current assessment indicate that he is suitable for bariatric surgery. Sigmond has a history of treatment for anxiety and/or OCD in the past, his anxiety is stable with medication. Patient understands the procedure, the risks associated with it, and the importance of post-operative holistic care (Physical, Spiritual/Values, Relationships, and Mental/Emotional health) with access to resources for support as needed. The patient has made an informed decision to proceed with the procedure. The patient is motivated and expressed understanding of the post-surgical requirements. Patient's psychological assessment will be valid from today's date for 6 months (02.14.2024). Then, a follow-up appointment will be needed to re-evaluate the patient's psychological status.    I see no significant psychological factors that would hinder the success of bariatric surgery. I support Tyeson Tanimoto Roddy's desire for Bariatric  Surgery.     Bynum Bellows, LCSW   Referrals to Alternative Service(s): Referred to Alternative Service(s):   Place:   Date:   Time:    Referred to Alternative Service(s):   Place:   Date:   Time:    Referred to Alternative Service(s):   Place:   Date:   Time:    Referred to Alternative Service(s):   Place:   Date:   Time:      Collaboration of Care: Other provider involved in patient's care [facility].  Patient/Guardian was advised Release of Information must be obtained prior to any record release in order to collaborate their care with an outside provider. Patient/Guardian was advised if they have not already done so to contact the registration department to sign all necessary forms in order for [place] to release information regarding their care.   Consent: Patient/Guardian gives verbal consent for treatment and assignment of benefits for services provided during this visit. Patient/Guardian expressed understanding and agreed to proceed.   Bynum Bellows, LCSW

## 2022-03-01 ENCOUNTER — Ambulatory Visit (INDEPENDENT_AMBULATORY_CARE_PROVIDER_SITE_OTHER): Payer: 59

## 2022-03-01 ENCOUNTER — Ambulatory Visit (INDEPENDENT_AMBULATORY_CARE_PROVIDER_SITE_OTHER): Payer: 59 | Admitting: Internal Medicine

## 2022-03-01 ENCOUNTER — Encounter: Payer: Self-pay | Admitting: Internal Medicine

## 2022-03-01 VITALS — BP 124/82 | HR 88 | Temp 98.2°F | Ht 73.0 in | Wt 378.0 lb

## 2022-03-01 DIAGNOSIS — E221 Hyperprolactinemia: Secondary | ICD-10-CM

## 2022-03-01 DIAGNOSIS — J4541 Moderate persistent asthma with (acute) exacerbation: Secondary | ICD-10-CM

## 2022-03-01 DIAGNOSIS — S59902A Unspecified injury of left elbow, initial encounter: Secondary | ICD-10-CM

## 2022-03-01 DIAGNOSIS — Z6841 Body Mass Index (BMI) 40.0 and over, adult: Secondary | ICD-10-CM | POA: Diagnosis not present

## 2022-03-01 DIAGNOSIS — Z0001 Encounter for general adult medical examination with abnormal findings: Secondary | ICD-10-CM

## 2022-03-01 DIAGNOSIS — E230 Hypopituitarism: Secondary | ICD-10-CM

## 2022-03-01 DIAGNOSIS — Z23 Encounter for immunization: Secondary | ICD-10-CM

## 2022-03-01 DIAGNOSIS — I10 Essential (primary) hypertension: Secondary | ICD-10-CM | POA: Diagnosis not present

## 2022-03-01 DIAGNOSIS — M7712 Lateral epicondylitis, left elbow: Secondary | ICD-10-CM

## 2022-03-01 DIAGNOSIS — R7303 Prediabetes: Secondary | ICD-10-CM

## 2022-03-01 LAB — BASIC METABOLIC PANEL
BUN: 8 mg/dL (ref 6–23)
CO2: 30 mEq/L (ref 19–32)
Calcium: 9.3 mg/dL (ref 8.4–10.5)
Chloride: 98 mEq/L (ref 96–112)
Creatinine, Ser: 1 mg/dL (ref 0.40–1.50)
GFR: 97.24 mL/min (ref >60.00)
Glucose, Bld: 86 mg/dL (ref 70–99)
Potassium: 3.5 mEq/L (ref 3.5–5.1)
Sodium: 137 mEq/L (ref 135–145)

## 2022-03-01 LAB — LIPID PANEL
Cholesterol: 191 mg/dL (ref 0–200)
HDL: 23.4 mg/dL — ABNORMAL LOW (ref >39.00)
LDL Cholesterol: 129 mg/dL — ABNORMAL HIGH (ref 0–99)
NonHDL: 167.47
Total CHOL/HDL Ratio: 8
Triglycerides: 193 mg/dL — ABNORMAL HIGH (ref 0.0–149.0)
VLDL: 38.6 mg/dL (ref 0.0–40.0)

## 2022-03-01 LAB — CBC WITH DIFFERENTIAL/PLATELET
Basophils Absolute: 0.2 10*3/uL — ABNORMAL HIGH (ref 0.0–0.1)
Basophils Relative: 1.6 % (ref 0.0–3.0)
Eosinophils Absolute: 0.1 10*3/uL (ref 0.0–0.7)
Eosinophils Relative: 1.5 % (ref 0.0–5.0)
HCT: 44.7 % (ref 39.0–52.0)
Hemoglobin: 14.8 g/dL (ref 13.0–17.0)
Lymphocytes Relative: 31.1 % (ref 12.0–46.0)
Lymphs Abs: 3.1 10*3/uL (ref 0.7–4.0)
MCHC: 33.2 g/dL (ref 30.0–36.0)
MCV: 74.5 fl — ABNORMAL LOW (ref 78.0–100.0)
Monocytes Absolute: 0.5 10*3/uL (ref 0.1–1.0)
Monocytes Relative: 4.9 % (ref 3.0–12.0)
Neutro Abs: 6 10*3/uL (ref 1.4–7.7)
Neutrophils Relative %: 60.9 % (ref 43.0–77.0)
Platelets: 315 10*3/uL (ref 150.0–400.0)
RBC: 6 Mil/uL — ABNORMAL HIGH (ref 4.22–5.81)
RDW: 16.8 % — ABNORMAL HIGH (ref 11.5–15.5)
WBC: 9.8 10*3/uL (ref 4.0–10.5)

## 2022-03-01 LAB — HEPATIC FUNCTION PANEL
ALT: 18 U/L (ref 0–53)
AST: 12 U/L (ref 0–37)
Albumin: 4.2 g/dL (ref 3.5–5.2)
Alkaline Phosphatase: 73 U/L (ref 39–117)
Bilirubin, Direct: 0.1 mg/dL (ref 0.0–0.3)
Total Bilirubin: 0.7 mg/dL (ref 0.2–1.2)
Total Protein: 7.5 g/dL (ref 6.0–8.3)

## 2022-03-01 MED ORDER — MONTELUKAST SODIUM 10 MG PO TABS: 10.0000 mg | ORAL_TABLET | Freq: Every day | ORAL | 1 refills | Status: DC

## 2022-03-01 NOTE — Progress Notes (Unsigned)
Subjective:  Patient ID: Frederick Snyder, male    DOB: [birth year]  Age: 36 y.o. MRN: [id number]  CC: No chief complaint on file.   HPI [name] presents for ***  Outpatient Medications Prior to Visit  Medication Sig Dispense Refill   indapamide (LOZOL) 1.25 MG tablet Take 1 tablet (1.25 mg total) by mouth daily. 90 tablet 1   sertraline (ZOLOFT) 50 MG tablet Take 1 tablet (50 mg total) by mouth daily. 30 tablet 3   Testosterone Enanthate (XYOSTED) 100 MG/0.5ML SOAJ Inject 1 Act into the skin once a week. 6 mL 1   valsartan (DIOVAN) 320 MG tablet Take 1 tablet (320 mg total) by mouth daily. 90 tablet 1   montelukast (SINGULAIR) 10 MG tablet TAKE 1 TABLET BY MOUTH AT  BEDTIME 90 tablet 1   Semaglutide,0.25 or 0.5MG /DOS, (OZEMPIC, 0.25 OR 0.5 MG/DOSE,) 2 MG/1.5ML SOPN Inject into the skin.     lisdexamfetamine (VYVANSE) 50 MG capsule Take 1 capsule (50 mg total) by mouth daily. 30 capsule 0   No facility-administered medications prior to visit.    ROS Review of Systems  Objective:  BP 124/82 (BP Location: Left Arm, Patient Position: Sitting, Cuff Size: Normal)   Pulse 88   Temp 98.2 F (36.8 C) (Oral)   Ht 6\' 1"  (1.854 m)   Wt (!) 378 lb (171.5 kg)   SpO2 98%   BMI 49.87 kg/m   BP Readings from Last 3 Encounters:  03/01/22 124/82  10/30/21 136/86  08/24/21 136/88    Wt Readings from Last 3 Encounters:  03/01/22 (!) 378 lb (171.5 kg)  12/11/21 (!) 384 lb 3.2 oz (174.3 kg)  10/30/21 (!) 397 lb (180.1 kg)    Physical Exam  Lab Results  Component Value Date   WBC 9.8 03/01/2022   HGB 14.8 03/01/2022   HCT 44.7 03/01/2022   PLT 315.0 03/01/2022   GLUCOSE 86 03/01/2022   CHOL 191 03/01/2022   TRIG 193.0 (H) 03/01/2022   HDL 23.40 (L) 03/01/2022   LDLDIRECT 114.0 09/07/2020   LDLCALC 129 (H) 03/01/2022   ALT 18 03/01/2022   AST 12 03/01/2022   NA 137 03/01/2022   K 3.5 03/01/2022   CL 98 03/01/2022   CREATININE 1.00 03/01/2022   BUN 8  03/01/2022   CO2 30 03/01/2022   TSH 1.51 03/01/2022   HGBA1C 5.8 (H) 03/01/2022    DG Chest 2 View  Result Date: 11/13/2021 CLINICAL DATA:  Severe obesity.  Preop bariatric surgery EXAM: CHEST - 2 VIEW COMPARISON:  None Available. FINDINGS: The cardiomediastinal contours are normal. The lungs are clear. Pulmonary vasculature is normal. No consolidation, pleural effusion, or pneumothorax. No acute osseous abnormalities are seen. IMPRESSION: Negative radiographs of the chest. Electronically Signed   By: Keith Rake M.D.   On: 11/13/2021 20:36   DG UGI W SINGLE CM (SOL OR THIN BA)  Result Date: 11/13/2021 CLINICAL DATA:  21 old male being evluated for gastric sleeve surgery. Request for UGI for anatomy evaluation prior to the surgery. EXAM: DG UGI W SINGLE CM TECHNIQUE: Scout radiograph was obtained. Single contrast examination was performed using thin liquid barium. This exam was performed by Durenda Guthrie, PA-C, and was supervised and interpreted by Suzy Bouchard, MD. FLUOROSCOPY: Radiation Exposure Index (as provided by the fluoroscopic device): 82.8 mGy Kerma COMPARISON:  None Available. FINDINGS: Scout Radiograph: Within normal limits. Esophagus:  Normal appearance. Esophageal motility:  Within normal limits. Gastroesophageal reflux:  Mild gastroesophageal reflux.  Ingested 24mm barium tablet:  Normally. Stomach: Normal appearance. No hiatal hernia. Gastric emptying: Normal. Duodenum:  Normal appearance. Other:  None. IMPRESSION: 1.  Mild gastroesophageal reflux. 2.  No hiatal hernia. 3.  Normal anatomy of the esophagus, stomach and duodenum. Electronically Signed   By: Genevive Bi M.D.   On: 11/13/2021 10:49    Assessment & Plan:   Diagnoses and all orders for this visit:  Elbow injury, left, initial encounter -     DG Elbow Complete Left; Future  Moderate persistent asthma with acute exacerbation -     montelukast (SINGULAIR) 10 MG tablet; Take 1 tablet (10 mg total) by  mouth at bedtime.  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  Morbid obesity with BMI of 50.0-59.9, adult (HCC) -     TSH; Future -     Hepatic function panel; Future -     Hemoglobin A1c; Future -     Hemoglobin A1c -     Hepatic function panel -     TSH -     Semaglutide,0.25 or 0.5MG /DOS, (OZEMPIC, 0.25 OR 0.5 MG/DOSE,) 2 MG/3ML SOPN; Inject 0.5 mg into the skin once a week.  Encounter for general adult medical examination with abnormal findings -     Lipid panel; Future -     Lipid panel  Other orders -     Flu Vaccine QUAD 6+ mos PF IM (Fluarix Quad PF)   I have discontinued Lesia Sago. Moye's Ozempic (0.25 or 0.5 MG/DOSE). I have also changed his montelukast. Additionally, I am having him start on Ozempic (0.25 or 0.5 MG/DOSE). Lastly, I am having him maintain his Xyosted, valsartan, indapamide, lisdexamfetamine, and sertraline.  Meds ordered this encounter  Medications   montelukast (SINGULAIR) 10 MG tablet    Sig: Take 1 tablet (10 mg total) by mouth at bedtime.    Dispense:  90 tablet    Refill:  1   Semaglutide,0.25 or 0.5MG /DOS, (OZEMPIC, 0.25 OR 0.5 MG/DOSE,) 2 MG/3ML SOPN    Sig: Inject 0.5 mg into the skin once a week.    Dispense:  3 mL    Refill:  0     Follow-up: Return in about 3 months (around 05/31/2022).  Sanda Linger, MD

## 2022-03-01 NOTE — Patient Instructions (Signed)
Tennis Elbow  Tennis elbow (lateral epicondylitis) is inflammation of tendons in your outer forearm, near your elbow. Tendons are tissues that connect muscle to bone. When you have tennis elbow, inflammation affects the tendons that you use to bend your wrist and move your hand up. Inflammation occurs in the lower part of the upper arm bone (humerus), where the tendons connect to the bone (lateral epicondyle). Tennis elbow often affects people who play tennis, but anyone may get the condition from repeatedly extending the wrist or turning the forearm. What are the causes? This condition is usually caused by repeatedly extending the wrist, turning the forearm, and using the hands. It can result from sports or work that requires repetitive forearm movements. In some cases, it may be caused by a sudden injury. What increases the risk? You are more likely to develop tennis elbow if you play tennis or another racket sport. You also have a higher risk if you frequently use your hands for work. Besides people who play tennis, others at greater risk include: People who use computers. Construction workers. People who work in factories. Musicians. Cooks. Cashiers. What are the signs or symptoms? Symptoms of this condition include: Pain and tenderness in the forearm and the outer part of the elbow. Pain may be felt only when using the arm, or it may be there all the time. A burning feeling that starts in the elbow and spreads down the forearm. A weak grip in the hand. How is this diagnosed? This condition is diagnosed based on your symptoms, your medical history, and a physical exam. You may also have X-rays or an MRI to: Confirm the diagnosis. Look for other issues. Check for tears in the ligaments, muscles, or tendons. How is this treated? Resting and icing your arm is often the first treatment. Your health care provider may also recommend: Medicines to reduce pain and inflammation. These may be in  the form of a pill, topical gels, or shots of a steroid medicine (cortisone). An elbow strap to reduce stress on the area. Physical therapy. This may include massage or exercises or both. An elbow brace to restrict the movements that cause symptoms. If these treatments do not help relieve your symptoms, your health care provider may recommend surgery to remove damaged muscle and reattach healthy muscle to bone. Follow these instructions at home: If you have a brace or strap: Wear the brace or strap as told by your health care provider. Remove it only as told by your health care provider. Check the skin around the brace or strap every day. Tell your health care provider about any concerns. Loosen the brace if your fingers tingle, become numb, or turn cold and blue. Keep the brace clean. If the brace or strap is not waterproof: Do not let it get wet. Cover it with a watertight covering when you take a bath or a shower. Managing pain, stiffness, and swelling  If directed, put ice on the injured area. To do this: If you have a removable brace or strap, remove it as told by your health care provider. Put ice in a plastic bag. Place a towel between your skin and the bag. Leave the ice on for 20 minutes, 2-3 times a day. Remove the ice if your skin turns bright red. This is very important. If you cannot feel pain, heat, or cold, you have a greater risk of damage to the area. Move your fingers often to reduce stiffness and swelling. Activity Rest your elbow   and wrist and avoid activities that cause symptoms as told by your health care provider. Do physical therapy exercises as told by your health care provider. If you lift an object, lift it with your palm facing up. This reduces stress on your elbow. Lifestyle If your tennis elbow is caused by sports, check your equipment and make sure that: You use it correctly. It is good match for you. If your tennis elbow is caused by work or computer  use, take frequent breaks to stretch your arm. Talk with your employer about ways to manage your condition at work. General instructions Take over-the-counter and prescription medicines only as told by your health care provider. Do not use any products that contain nicotine or tobacco. These products include cigarettes, chewing tobacco, and vaping devices, such as e-cigarettes. If you need help quitting, ask your health care provider. Keep all follow-up visits. This is important. How is this prevented? Before and after activity: Warm up and stretch before being active. Cool down and stretch after being active. Give your body time to rest between periods of activity. During activity: Make sure to use equipment that fits you. If you play tennis, put power in your stroke with your lower body. Avoid using your arm only. Maintain physical fitness, including: Strength. Flexibility. Endurance. Do exercises to strengthen the forearm muscles. Contact a health care provider if: You have pain that gets worse or does not get better with treatment. You have numbness or weakness in your forearm, hand, or fingers. Get help right away if: Your pain is severe. You cannot move your wrist. Summary Tennis elbow (lateral epicondylitis) is inflammation of tendons in your outer forearm, near your elbow. Common symptoms include pain and tenderness in your forearm and the outer part of your elbow. This condition is usually caused by repeatedly extending your wrist, turning your forearm, and using your hands. The first treatment is often resting and icing your arm to relieve symptoms. Further treatment may include taking medicine, getting physical therapy, wearing a brace or strap, or having surgery. This information is not intended to replace advice given to you by your health care provider. Make sure you discuss any questions you have with your health care provider. Document Revised: 12/08/2019 Document  Reviewed: 12/08/2019 Elsevier Patient Education  2023 Elsevier Inc.  

## 2022-03-02 LAB — HEMOGLOBIN A1C
Hgb A1c MFr Bld: 5.8 % of total Hgb — ABNORMAL HIGH (ref <5.7)
Mean Plasma Glucose: 120 mg/dL
eAG (mmol/L): 6.6 mmol/L

## 2022-03-02 LAB — TESTOSTERONE TOTAL,FREE,BIO, MALES
Albumin: 4.4 g/dL (ref 3.6–5.1)
Sex Hormone Binding: 9 nmol/L — ABNORMAL LOW (ref 10–50)
Testosterone, Bioavailable: 360.2 ng/dL (ref 110.0–575.0)
Testosterone, Free: 178.9 pg/mL (ref 46.0–224.0)
Testosterone: 559 ng/dL (ref 250–827)

## 2022-03-02 LAB — TSH: TSH: 1.51 u[IU]/mL (ref 0.35–5.50)

## 2022-03-02 LAB — PROLACTIN: Prolactin: 9.4 ng/mL (ref 2.0–18.0)

## 2022-03-02 MED ORDER — OZEMPIC (0.25 OR 0.5 MG/DOSE) 2 MG/3ML ~~LOC~~ SOPN: 0.5000 mg | PEN_INJECTOR | SUBCUTANEOUS | 0 refills | Status: DC

## 2022-03-04 DIAGNOSIS — M7712 Lateral epicondylitis, left elbow: Secondary | ICD-10-CM | POA: Insufficient documentation

## 2022-03-04 MED ORDER — MELOXICAM 15 MG PO TABS: 15.0000 mg | ORAL_TABLET | Freq: Every day | ORAL | 0 refills | Status: DC

## 2022-03-04 NOTE — Addendum Note (Signed)
Addended by: Janith Lima on: 03/04/2022 02:56 PM   Modules accepted: Level of Service

## 2022-03-19 ENCOUNTER — Other Ambulatory Visit: Payer: Self-pay | Admitting: Behavioral Health

## 2022-03-19 DIAGNOSIS — F902 Attention-deficit hyperactivity disorder, combined type: Secondary | ICD-10-CM

## 2022-03-20 NOTE — Telephone Encounter (Signed)
Filled 9/4 appt 10/26

## 2022-04-03 ENCOUNTER — Other Ambulatory Visit: Payer: Self-pay | Admitting: Internal Medicine

## 2022-04-03 ENCOUNTER — Encounter: Payer: Self-pay | Admitting: Internal Medicine

## 2022-04-03 DIAGNOSIS — R7303 Prediabetes: Secondary | ICD-10-CM

## 2022-04-03 MED ORDER — SEMAGLUTIDE (1 MG/DOSE) 4 MG/3ML ~~LOC~~ SOPN: 1.0000 mg | PEN_INJECTOR | SUBCUTANEOUS | 0 refills | Status: DC

## 2022-04-05 ENCOUNTER — Ambulatory Visit (INDEPENDENT_AMBULATORY_CARE_PROVIDER_SITE_OTHER): Payer: Self-pay | Admitting: Behavioral Health

## 2022-04-05 DIAGNOSIS — F489 Nonpsychotic mental disorder, unspecified: Secondary | ICD-10-CM

## 2022-04-05 NOTE — Progress Notes (Signed)
Patient did not show for scheduled appointment and did not provide 24 hour notice as required. Additional fees to be assessed.

## 2022-04-09 ENCOUNTER — Ambulatory Visit: Payer: 59 | Admitting: Behavioral Health

## 2022-04-09 ENCOUNTER — Encounter: Payer: Self-pay | Admitting: Dietician

## 2022-04-09 ENCOUNTER — Encounter: Payer: 59 | Attending: General Surgery | Admitting: Dietician

## 2022-04-09 DIAGNOSIS — I10 Essential (primary) hypertension: Secondary | ICD-10-CM | POA: Diagnosis not present

## 2022-04-09 DIAGNOSIS — Z713 Dietary counseling and surveillance: Secondary | ICD-10-CM | POA: Insufficient documentation

## 2022-04-09 DIAGNOSIS — J45909 Unspecified asthma, uncomplicated: Secondary | ICD-10-CM | POA: Diagnosis not present

## 2022-04-09 DIAGNOSIS — E785 Hyperlipidemia, unspecified: Secondary | ICD-10-CM | POA: Diagnosis not present

## 2022-04-09 DIAGNOSIS — Z6841 Body Mass Index (BMI) 40.0 and over, adult: Secondary | ICD-10-CM | POA: Insufficient documentation

## 2022-04-09 DIAGNOSIS — G4730 Sleep apnea, unspecified: Secondary | ICD-10-CM | POA: Insufficient documentation

## 2022-04-09 DIAGNOSIS — E669 Obesity, unspecified: Secondary | ICD-10-CM

## 2022-04-09 NOTE — Progress Notes (Signed)
Pre-Operative Nutrition Class:    Patient was seen on 04/09/2022 for Pre-Operative Bariatric Surgery Education at [facility].    Surgery date:  Surgery type: RYGB  Anthropometrics  Start weight at [facility]: 384.2 lbs (date: 12/11/2021)  Height: 72 in Weight today: 380.8 lbs. BMI: 50.24 kg/m2     Clinical  Medical hx: Asthma, hyperlipidemia, HTN, Sleep Apnea Medications: Valsartan, Vyvanse, Indapamide, Xyosted, Sertraline, Singular, Ozempic  Labs: Vit D 17.73 (7 months ago) Notable signs/symptoms: none noted Any previous deficiencies? No  Samples given per MNT protocol. Patient educated on appropriate usage:  [employer] [facility] Multivitamin Citrus Chewable Lot # [id number] Exp: 9/26  Bariatric Advantage Calcium  Lot # [id number] Exp:05/01/2022  Ensure Max Protein Shake Lot # [id number] Exp: 1JAN2024  The following the learning objectives were met by the patient during this course: Identify Pre-Op Dietary Goals and will begin 2 weeks pre-operatively Identify appropriate sources of fluids and proteins  State protein recommendations and appropriate sources pre and post-operatively Identify Post-Operative Dietary Goals and will follow for 2 weeks post-operatively Identify appropriate multivitamin and calcium sources Describe the need for physical activity post-operatively and will follow MD recommendations State when to call healthcare provider regarding medication questions or post-operative complications When having a diagnosis of diabetes understanding hypoglycemia symptoms and the inclusion of 1 complex carbohydrate per meal  Handouts given during class include: Pre-Op Bariatric Surgery Diet Handout Protein Shake Handout Post-Op Bariatric Surgery Nutrition Handout BELT Program Information Flyer Support Group Information Flyer [facility] Bariatric Supplements Price List  Follow-Up Plan: Patient will follow-up at [facility] 2 weeks post  operatively for diet advancement per MD.  

## 2022-04-26 ENCOUNTER — Encounter: Payer: Self-pay | Admitting: Behavioral Health

## 2022-04-26 ENCOUNTER — Ambulatory Visit (INDEPENDENT_AMBULATORY_CARE_PROVIDER_SITE_OTHER): Payer: 59 | Admitting: Behavioral Health

## 2022-04-26 DIAGNOSIS — F902 Attention-deficit hyperactivity disorder, combined type: Secondary | ICD-10-CM | POA: Diagnosis not present

## 2022-04-26 DIAGNOSIS — F411 Generalized anxiety disorder: Secondary | ICD-10-CM

## 2022-04-26 DIAGNOSIS — F331 Major depressive disorder, recurrent, moderate: Secondary | ICD-10-CM | POA: Diagnosis not present

## 2022-04-26 MED ORDER — SERTRALINE HCL 100 MG PO TABS: 100.0000 mg | ORAL_TABLET | Freq: Every day | ORAL | 1 refills | Status: DC

## 2022-04-26 MED ORDER — LISDEXAMFETAMINE DIMESYLATE 50 MG PO CAPS: 50.0000 mg | ORAL_CAPSULE | Freq: Every day | ORAL | 0 refills | Status: DC

## 2022-04-26 NOTE — Progress Notes (Signed)
[facility]  Patient ID: Frederick Snyder,  MRN: [id number]  PCP: Janith Lima, MD  Date of Evaluation: 04/26/2022 Time spent:30 minutes  Chief Complaint:  Chief Complaint   Depression; Anxiety; Follow-up; Medication Refill; Patient Education     HISTORY/CURRENT STATUS: HPI 36 year old male presents to this office for follow up and medication management. Feel like he is experiencing some increased anxiety due to pending gastric bypass surgery on December 18 th.  He was doing well overall. He is requesting increase of his Zoloft.   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 Requesting no medication changes this visit. Says his anxiety is 3/10 today but has upcoming gastro bypass consultation he is worried about. Depression is 3/10. Says he is sleeping 7-8 hours per night. Denies mania, no psychosis, no SI/HI.   Previous psychiatric medications: Ritalin Vyvanse Adderall  Zoloft Individual Medical History/ Review of Systems: Changes? :No   Allergies: Patient has no known allergies.  Current Medications:  Current Outpatient Medications:    sertraline (ZOLOFT) 100 MG tablet, Take 1 tablet (100 mg total) by mouth daily., Disp: 90 tablet, Rfl: 1   indapamide (LOZOL) 1.25 MG tablet, Take 1 tablet (1.25 mg total) by mouth daily., Disp: 90 tablet, Rfl: 1   lisdexamfetamine (VYVANSE) 50 MG capsule, Take 1 capsule (50 mg total) by mouth daily., Disp: 30 capsule, Rfl: 0   meloxicam (MOBIC) 15 MG tablet, Take 1 tablet (15 mg total) by mouth daily., Disp: 30 tablet, Rfl: 0   montelukast (SINGULAIR) 10 MG tablet, Take 1 tablet (10 mg total) by mouth at bedtime., Disp: 90 tablet, Rfl: 1   Semaglutide, 1 MG/DOSE, 4 MG/3ML SOPN, Inject 1 mg as directed once a week., Disp: 9 mL, Rfl: 0   sertraline (ZOLOFT) 50 MG tablet, Take 1 tablet (50 mg total) by mouth daily., Disp: 30 tablet, Rfl: 3   Testosterone Enanthate (XYOSTED) 100 MG/0.5ML SOAJ, Inject 1 Act  into the skin once a week., Disp: 6 mL, Rfl: 1   valsartan (DIOVAN) 320 MG tablet, Take 1 tablet (320 mg total) by mouth daily., Disp: 90 tablet, Rfl: 1 Medication Side Effects: none  Family Medical/ Social History: Changes? No  MENTAL HEALTH EXAM:  There were no vitals taken for this visit.There is no height or weight on file to calculate BMI.  General Appearance: Casual, Neat, and Well Groomed  Eye Contact:  Good  Speech:  Clear and Coherent  Volume:  Normal  Mood:  NA  Affect:  Appropriate  Thought Process:  Coherent  Orientation:  Full (Time, Place, and Person)  Thought Content: Logical   Suicidal Thoughts:  No  Homicidal Thoughts:  No  Memory:  WNL  Judgement:  Good  Insight:  Good  Psychomotor Activity:  Normal  Concentration:  Concentration: Good  Recall:  Good  Fund of Knowledge: Good  Language: Good  Assets:  Desire for Improvement  ADL's:  Intact  Cognition: WNL  Prognosis:  Good    DIAGNOSES:    ICD-10-CM   1. Generalized anxiety disorder  F41.1 sertraline (ZOLOFT) 100 MG tablet    2. Attention deficit hyperactivity disorder (ADHD), combined type  F90.2 lisdexamfetamine (VYVANSE) 50 MG capsule    3. Major depressive disorder, recurrent episode, moderate (HCC)  F33.1 sertraline (ZOLOFT) 100 MG tablet      Receiving Psychotherapy: No    RECOMMENDATIONS:  Greater than 50% of 20 min face to face time with patient was  spent on counseling and coordination of care.Requesting increase of Zoloft. He is fairly stable but anticipates continued increase of anxiety. He is requesting increase of Zoloft.    Continue Vyvanse to 50 mg daily Increase Zoloft to 100 mg daily.  To follow up in 3 months to reassess Will monitor BP and HR regularly and log to provide to PCP Will report side effects or worsening symptoms and seek medical attention immediately if BP rises above 180.  Discussed potential benefits, risks, and side effects of stimulants with patient to include  increased heart rate, palpitations, insomnia, increased anxiety, increased irritability, or decreased appetite.  Instructed patient to contact office if experiencing any significant tolerability issues.  Reviewed PDMP         Joan Flores, NP

## 2022-04-30 ENCOUNTER — Ambulatory Visit (INDEPENDENT_AMBULATORY_CARE_PROVIDER_SITE_OTHER): Payer: 59 | Admitting: Internal Medicine

## 2022-04-30 ENCOUNTER — Encounter: Payer: Self-pay | Admitting: Internal Medicine

## 2022-04-30 VITALS — BP 128/84 | HR 94 | Temp 98.5°F | Ht 72.0 in | Wt 377.0 lb

## 2022-04-30 DIAGNOSIS — I10 Essential (primary) hypertension: Secondary | ICD-10-CM | POA: Diagnosis not present

## 2022-04-30 DIAGNOSIS — M7712 Lateral epicondylitis, left elbow: Secondary | ICD-10-CM | POA: Diagnosis not present

## 2022-04-30 NOTE — Progress Notes (Signed)
Subjective:  Patient ID: Frederick Snyder, male    DOB: Jun 21, 1985  Age: 36 y.o. MRN: [id number]  CC: Hypertension   HPI OSEAS DETTY presents for f/up -  He continues to complain of left elbow pain.  He has gotten minimal symptom relief with the NSAID.  He would like to consider having the area injected with a steroid.  Outpatient Medications Prior to Visit  Medication Sig Dispense Refill   indapamide (LOZOL) 1.25 MG tablet Take 1 tablet (1.25 mg total) by mouth daily. 90 tablet 1   lisdexamfetamine (VYVANSE) 50 MG capsule Take 1 capsule (50 mg total) by mouth daily. 30 capsule 0   montelukast (SINGULAIR) 10 MG tablet Take 1 tablet (10 mg total) by mouth at bedtime. 90 tablet 1   Semaglutide, 1 MG/DOSE, 4 MG/3ML SOPN Inject 1 mg as directed once a week. 9 mL 0   sertraline (ZOLOFT) 100 MG tablet Take 1 tablet (100 mg total) by mouth daily. 90 tablet 1   Testosterone Enanthate (XYOSTED) 100 MG/0.5ML SOAJ Inject 1 Act into the skin once a week. 6 mL 1   valsartan (DIOVAN) 320 MG tablet Take 1 tablet (320 mg total) by mouth daily. 90 tablet 1   meloxicam (MOBIC) 15 MG tablet Take 1 tablet (15 mg total) by mouth daily. 30 tablet 0   sertraline (ZOLOFT) 50 MG tablet Take 1 tablet (50 mg total) by mouth daily. 30 tablet 3   No facility-administered medications prior to visit.    ROS Review of Systems  Constitutional: Negative.  Negative for diaphoresis and fatigue.  HENT: Negative.    Eyes: Negative.   Respiratory:  Negative for chest tightness, shortness of breath and wheezing.   Cardiovascular:  Negative for chest pain, palpitations and leg swelling.  Gastrointestinal:  Negative for abdominal pain.  Endocrine: Negative.   Genitourinary: Negative.  Negative for difficulty urinating.  Musculoskeletal:  Positive for arthralgias. Negative for joint swelling.  Allergic/Immunologic: Negative.   Neurological: Negative.  Negative for dizziness.  Hematological:  Negative for  adenopathy. Does not bruise/bleed easily.  Psychiatric/Behavioral: Negative.      Objective:  BP 128/84 (BP Location: Left Arm, Patient Position: Sitting, Cuff Size: Large)   Pulse 94   Temp 98.5 F (36.9 C) (Oral)   Ht 6' (1.829 m)   Wt (!) 377 lb (171 kg)   SpO2 94%   BMI 51.13 kg/m   BP Readings from Last 3 Encounters:  04/30/22 128/84  03/01/22 124/82  10/30/21 136/86    Wt Readings from Last 3 Encounters:  04/30/22 (!) 377 lb (171 kg)  04/09/22 (!) 380 lb 12.8 oz (172.7 kg)  03/01/22 (!) 378 lb (171.5 kg)    Physical Exam Vitals reviewed.  HENT:     Mouth/Throat:     Mouth: Mucous membranes are moist.  Eyes:     General: No scleral icterus.    Conjunctiva/sclera: Conjunctivae normal.  Cardiovascular:     Rate and Rhythm: Normal rate.  Pulmonary:     Effort: Pulmonary effort is normal.     Breath sounds: No stridor. No wheezing, rhonchi or rales.  Abdominal:     General: Abdomen is flat.     Palpations: There is no mass.     Tenderness: There is no abdominal tenderness. There is no guarding.     Hernia: No hernia is present.  Musculoskeletal:        General: Normal range of motion.     Left elbow: No  swelling, deformity or effusion. Normal range of motion. Tenderness present in lateral epicondyle. No radial head, medial epicondyle or olecranon process tenderness.     Right lower leg: No edema.     Left lower leg: No edema.  Skin:    General: Skin is warm and dry.     Lab Results  Component Value Date   WBC 9.8 03/01/2022   HGB 14.8 03/01/2022   HCT 44.7 03/01/2022   PLT 315.0 03/01/2022   GLUCOSE 86 03/01/2022   CHOL 191 03/01/2022   TRIG 193.0 (H) 03/01/2022   HDL 23.40 (L) 03/01/2022   LDLDIRECT 114.0 09/07/2020   LDLCALC 129 (H) 03/01/2022   ALT 18 03/01/2022   AST 12 03/01/2022   NA 137 03/01/2022   K 3.5 03/01/2022   CL 98 03/01/2022   CREATININE 1.00 03/01/2022   BUN 8 03/01/2022   CO2 30 03/01/2022   TSH 1.51 03/01/2022   HGBA1C  5.8 (H) 03/01/2022    DG Chest 2 View  Result Date: 11/13/2021 CLINICAL DATA:  Severe obesity.  Preop bariatric surgery EXAM: CHEST - 2 VIEW COMPARISON:  None Available. FINDINGS: The cardiomediastinal contours are normal. The lungs are clear. Pulmonary vasculature is normal. No consolidation, pleural effusion, or pneumothorax. No acute osseous abnormalities are seen. IMPRESSION: Negative radiographs of the chest. Electronically Signed   By: Narda Rutherford M.D.   On: 11/13/2021 20:36   DG UGI W SINGLE CM (SOL OR THIN BA)  Result Date: 11/13/2021 CLINICAL DATA:  62 old male being evluated for gastric sleeve surgery. Request for UGI for anatomy evaluation prior to the surgery. EXAM: DG UGI W SINGLE CM TECHNIQUE: Scout radiograph was obtained. Single contrast examination was performed using thin liquid barium. This exam was performed by Lawernce Ion, PA-C, and was supervised and interpreted by Genevive Bi, MD. FLUOROSCOPY: Radiation Exposure Index (as provided by the fluoroscopic device): 82.8 mGy Kerma COMPARISON:  None Available. FINDINGS: Scout Radiograph: Within normal limits. Esophagus:  Normal appearance. Esophageal motility:  Within normal limits. Gastroesophageal reflux:  Mild gastroesophageal reflux. Ingested 90mm barium tablet:  Normally. Stomach: Normal appearance. No hiatal hernia. Gastric emptying: Normal. Duodenum:  Normal appearance. Other:  None. IMPRESSION: 1.  Mild gastroesophageal reflux. 2.  No hiatal hernia. 3.  Normal anatomy of the esophagus, stomach and duodenum. Electronically Signed   By: Genevive Bi M.D.   On: 11/13/2021 10:49    Assessment & Plan:   Arek was seen today for hypertension.  Diagnoses and all orders for this visit:  Primary hypertension- His blood pressure is adequately well-controlled.  Lateral epicondylitis of left elbow -     Ambulatory referral to Orthopedic Surgery   I have discontinued Lesia Sago. Cantrell's meloxicam. I am also  having him maintain his Xyosted, valsartan, indapamide, montelukast, Semaglutide (1 MG/DOSE), sertraline, and lisdexamfetamine.  No orders of the defined types were placed in this encounter.    Follow-up: No follow-ups on file.  Sanda Linger, MD

## 2022-05-02 ENCOUNTER — Ambulatory Visit (INDEPENDENT_AMBULATORY_CARE_PROVIDER_SITE_OTHER): Payer: 59 | Admitting: Orthopaedic Surgery

## 2022-05-02 ENCOUNTER — Encounter: Payer: Self-pay | Admitting: Orthopaedic Surgery

## 2022-05-02 DIAGNOSIS — M7712 Lateral epicondylitis, left elbow: Secondary | ICD-10-CM | POA: Diagnosis not present

## 2022-05-02 MED ORDER — METHYLPREDNISOLONE ACETATE 40 MG/ML IJ SUSP
20.0000 mg | INTRAMUSCULAR | Status: AC | PRN
  Administered 2022-05-02: 20 mg

## 2022-05-02 MED ORDER — LIDOCAINE HCL 1 % IJ SOLN
1.0000 mL | INTRAMUSCULAR | Status: AC | PRN
  Administered 2022-05-02: 1 mL

## 2022-05-02 NOTE — Progress Notes (Signed)
Office Visit Note   Patient: Frederick Snyder           Date of Birth: 1985-10-19           MRN: [id number] Visit Date: 05/02/2022              Requested by: Etta Grandchild, MD [address] [place],  [place] [address] PCP: Etta Grandchild, MD   Assessment & Plan: Visit Diagnoses:  1. Lateral epicondylitis of left elbow     Plan: Mr. Buss relates about a 66-month history of lateral left elbow pain.  No history of injury or trauma.  He was seen by his primary care physician several months ago.  X-rays of the elbow were obtained and were negative for any obvious abnormality.  He was diagnosed with tennis elbow and has been wearing a splint but relates that he is continues to have a pain along the lateral elbow particularly with grip in extension.  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  Long discussion regarding his diagnosis and treatment options.  He like to have a cortisone injection.  This was performed without difficulty.  We will plan to see him back in the next several weeks should he continue to have a problem I have discussed the possibility of recurrent injections or even possibly surgery over time.  Follow-Up Instructions: Return if symptoms worsen or fail to improve.   Orders:  No orders of the defined types were placed in this encounter.  No orders of the defined types were placed in this encounter.     Procedures: Hand/UE Inj for lateral epicondylitis on 05/02/2022 10:34 AM Indications: pain Details: 25 G needle, lateral approach Medications: 1 mL lidocaine 1 %; 20 mg methylPREDNISolone acetate 40 MG/ML      Clinical Data: No additional findings.   Subjective: Chief Complaint  Patient presents with   Left Elbow - Pain   Left Wrist - Pain  At least a 46-month history of lateral left elbow pain without injury or trauma.  Was seen by his primary care physician several months ago with a diagnosis of tennis elbow.  Placed in a brace which has provided some  relief.  Has more pain with grip and extension than with flexion.  No numbness or tingling.  Has tried over-the-counter medicines without much relief.  Planning to have gastric bypass surgery within the next month  HPI  Review of Systems   Objective: Vital Signs: There were no vitals taken for this visit.  Physical Exam Constitutional:      Appearance: He is well-developed.  Eyes:     Pupils: Pupils are equal, round, and reactive to light.  Pulmonary:     Effort: Pulmonary effort is normal.  Skin:    General: Skin is warm and dry.  Neurological:     Mental Status: He is alert and oriented to person, place, and time.  Psychiatric:        Behavior: Behavior normal.     Ortho Exam awake alert and oriented x3.  Comfortable sitting.  Local tenderness directly over the lateral epicondyle left elbow.  Pain with grip more in extension than with flexion.  Skin intact.  Neurologically intact.  Full range of motion of elbow  Specialty Comments:  No specialty comments available.  Imaging: No results found.   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  Pure hypertriglyceridemia 05/03/2021   OSA on CPAP 05/03/2021   Prediabetes 05/03/2021   Primary hypertension 09/07/2020   Morbid obesity with BMI of 50.0-59.9, adult (HCC) 09/07/2020   Loud snoring 09/07/2020   Encounter for general adult medical examination with abnormal findings 09/07/2020   Vitamin D deficiency disease 09/07/2020   OCD (obsessive compulsive disorder) 04/06/2018   Attention deficit hyperactivity disorder (ADHD) 04/06/2018   Hyperprolactinemia (HCC) 02/26/2018   Asthma 05/25/2009   Past Medical History:  Diagnosis Date   Asthma    Hyperlipidemia    diet controlled - no meds   Hypertension    Migraine    OSA on CPAP 05/03/2021   PONV (postoperative nausea and vomiting)     Vitamin D deficiency     Family History  Problem Relation Age of Onset   Diabetes Mother    Heart disease Mother    Sleep apnea Mother    Diabetes Father    Heart disease Father    Hypertension Father    Sleep apnea Father    Diabetes Other    Hyperlipidemia Other    Hypertension Other    Other Neg Hx        hypogonadism    Past Surgical History:  Procedure Laterality Date   INGUINAL HERNIA REPAIR Left 12/08/2020   Procedure: OPEN HERNIA REPAIR INGUINAL ADULT;  Surgeon: Abigail Miyamoto, MD;  Location: [facility] OR;  Service: General;  Laterality: Left;  TAP BLOCK AND LMA.   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   Tobacco Use   Smoking status: Never   Smokeless tobacco: Former    Types: Chew   Tobacco comments:    Quit in approx 2007  Vaping Use   Vaping Use: Never used  Substance and Sexual Activity   Alcohol use: No   Drug use: No   Sexual activity: Yes    Partners: Female     Valeria Batman, MD   Note - This record has been created using [employer].  Chart creation errors have been sought, but may not always  have been located. Such creation errors do not reflect on  the standard of medical care.

## 2022-05-15 ENCOUNTER — Ambulatory Visit: Payer: Self-pay | Admitting: General Surgery

## 2022-05-15 DIAGNOSIS — R7303 Prediabetes: Secondary | ICD-10-CM

## 2022-05-15 DIAGNOSIS — I10 Essential (primary) hypertension: Secondary | ICD-10-CM

## 2022-05-15 NOTE — Progress Notes (Signed)
Sent message, via epic in basket, requesting orders in epic from surgeon.  

## 2022-05-21 NOTE — Progress Notes (Signed)
Anesthesia Review:  PCP: DR Sanda Linger- LOV 04/30/22  Cardiologist : none  Chest x-ray  11/13/21- 2 view  EKG : 10/13/21  Echo : CT cors- 06/01/20  Stress test: Cardiac Cath :  Activity level:  Sleep Study/ CPAP : Fasting Blood Sugar :      / Checks Blood Sugar -- times a day:   Blood Thinner/ Instructions /Last Dose: ASA / Instructions/ Last Dose :   Ozempic

## 2022-05-21 NOTE — Patient Instructions (Signed)
SURGICAL WAITING ROOM VISITATION Patients having surgery or a procedure may have no more than 2 support people in the waiting area - these visitors may rotate.   Children under the age of 32 must have an adult with them who is not the patient. If the patient needs to stay at the hospital during part of their recovery, the visitor guidelines for inpatient rooms apply. Pre-op nurse will coordinate an appropriate time for 1 support person to accompany patient in pre-op.  This support person may not rotate.    Please refer to the [facility] website for the visitor guidelines for Inpatients (after your surgery is over and you are in a regular room).       Your procedure is scheduled on 05/28/2022     Report to [facility] Main Entrance    Report to admitting at   [phone]   Call this number if you have problems the morning of surgery [phone]   Do not eat food :After Midnight.   After Midnight you may have the following liquids until __ 0430____ AM  DAY OF SURGERY  Water Non-Citrus Juices (without pulp, NO RED) Carbonated Beverages Black Coffee (NO MILK/CREAM OR CREAMERS, sugar ok)  Clear Tea (NO MILK/CREAM OR CREAMERS, sugar ok) regular and decaf                             Plain Jell-O (NO RED)                                           Fruit ices (not with fruit pulp, NO RED)                                     Popsicles (NO RED)                                                               Sports drinks like Gatorade (NO RED)         The day of surgery:  Drink ONE (1) Pre-Surgery Clear Ensure or G2 at  0430  ( have completed by ) the morning of surgery. Drink in one sitting. Do not sip.  This drink was given to you during your hospital  pre-op appointment visit. Nothing else to drink after completing the  Pre-Surgery Clear Ensure or G2.          If you have questions, please contact your surgeon's office.      Oral Hygiene is also important to reduce your risk  of infection.                                    Remember - BRUSH YOUR TEETH THE MORNING OF SURGERY WITH YOUR REGULAR TOOTHPASTE  DENTURES WILL BE REMOVED PRIOR TO SURGERY PLEASE DO NOT APPLY "Poly grip" OR ADHESIVES!!!   Do NOT smoke after Midnight   Take these medicines the morning of surgery with A SIP OF WATER:  omeprazole, zoloft  DO NOT TAKE ANY ORAL DIABETIC MEDICATIONS DAY OF YOUR SURGERY  Bring CPAP mask and tubing day of surgery.                              You may not have any metal on your body including hair pins, jewelry, and body piercing             Do not wear make-up, lotions, powders, perfumes/cologne, or deodorant  Do not wear nail polish including gel and S&S, artificial/acrylic nails, or any other type of covering on natural nails including finger and toenails. If you have artificial nails, gel coating, etc. that needs to be removed by a nail salon please have this removed prior to surgery or surgery may need to be canceled/ delayed if the surgeon/ anesthesia feels like they are unable to be safely monitored.   Do not shave  48 hours prior to surgery.               Men may shave face and neck.   Do not bring valuables to the hospital. [place] IS NOT             RESPONSIBLE   FOR VALUABLES.   Contacts, glasses, dentures or bridgework may not be worn into surgery.   Bring small overnight bag day of surgery.   DO NOT BRING YOUR HOME MEDICATIONS TO THE HOSPITAL. PHARMACY WILL DISPENSE MEDICATIONS LISTED ON YOUR MEDICATION LIST TO YOU DURING YOUR ADMISSION IN THE HOSPITAL!    Patients discharged on the day of surgery will not be allowed to drive home.  Someone NEEDS to stay with you for the first 24 hours after anesthesia.   Special Instructions: Bring a copy of your healthcare power of attorney and living will documents the day of surgery if you haven't scanned them before.              Please read over the following fact sheets you were given: IF YOU HAVE  QUESTIONS ABOUT YOUR PRE-OP INSTRUCTIONS PLEASE CALL [phone]   If you received a COVID test during your pre-op visit  it is requested that you wear a mask when out in public, stay away from anyone that may not be feeling well and notify your surgeon if you develop symptoms. If you test positive for Covid or have been in contact with anyone that has tested positive in the last 10 days please notify you surgeon.    [place] - Preparing for Surgery Before surgery, you can play an important role.  Because skin is not sterile, your skin needs to be as free of germs as possible.  You can reduce the number of germs on your skin by washing with CHG (chlorahexidine gluconate) soap before surgery.  CHG is an antiseptic cleaner which kills germs and bonds with the skin to continue killing germs even after washing. Please DO NOT use if you have an allergy to CHG or antibacterial soaps.  If your skin becomes reddened/irritated stop using the CHG and inform your nurse when you arrive at Short Stay. Do not shave (including legs and underarms) for at least 48 hours prior to the first CHG shower.  You may shave your face/neck. Please follow these instructions carefully:  1.  Shower with CHG Soap the night before surgery and the  morning of Surgery.  2.  If you choose to wash your hair, wash your hair first as usual  with your  normal  shampoo.  3.  After you shampoo, rinse your hair and body thoroughly to remove the  shampoo.                           4.  Use CHG as you would any other liquid soap.  You can apply chg directly  to the skin and wash                       Gently with a scrungie or clean washcloth.  5.  Apply the CHG Soap to your body ONLY FROM THE NECK DOWN.   Do not use on face/ open                           Wound or open sores. Avoid contact with eyes, ears mouth and genitals (private parts).                       Wash face,  Genitals (private parts) with your normal soap.             6.   Wash thoroughly, paying special attention to the area where your surgery  will be performed.  7.  Thoroughly rinse your body with warm water from the neck down.  8.  DO NOT shower/wash with your normal soap after using and rinsing off  the CHG Soap.                9.  Pat yourself dry with a clean towel.            10.  Wear clean pajamas.            11.  Place clean sheets on your bed the night of your first shower and do not  sleep with pets. Day of Surgery : Do not apply any lotions/deodorants the morning of surgery.  Please wear clean clothes to the hospital/surgery center.  FAILURE TO FOLLOW THESE INSTRUCTIONS MAY RESULT IN THE CANCELLATION OF YOUR SURGERY PATIENT SIGNATURE_________________________________  NURSE SIGNATURE__________________________________  ________________________________________________________________________

## 2022-05-22 ENCOUNTER — Encounter (HOSPITAL_COMMUNITY): Payer: Self-pay

## 2022-05-22 ENCOUNTER — Other Ambulatory Visit: Payer: Self-pay

## 2022-05-22 ENCOUNTER — Encounter (HOSPITAL_COMMUNITY)
Admission: RE | Admit: 2022-05-22 | Discharge: 2022-05-22 | Disposition: A | Discharge status: Home / Self Care | Payer: 59 | Source: Ambulatory Visit | Attending: General Surgery | Admitting: General Surgery

## 2022-05-22 DIAGNOSIS — R7303 Prediabetes: Secondary | ICD-10-CM | POA: Diagnosis not present

## 2022-05-22 DIAGNOSIS — I10 Essential (primary) hypertension: Secondary | ICD-10-CM

## 2022-05-22 DIAGNOSIS — Z01812 Encounter for preprocedural laboratory examination: Secondary | ICD-10-CM | POA: Insufficient documentation

## 2022-05-22 HISTORY — DX: Depression, unspecified: F32.A

## 2022-05-22 HISTORY — DX: Anxiety disorder, unspecified: F41.9

## 2022-05-22 HISTORY — DX: Personal history of urinary calculi: Z87.442

## 2022-05-22 HISTORY — DX: Gastro-esophageal reflux disease without esophagitis: K21.9

## 2022-05-22 HISTORY — DX: Anemia, unspecified: D64.9

## 2022-05-22 LAB — CBC WITH DIFFERENTIAL/PLATELET
Abs Immature Granulocytes: 0.04 10*3/uL (ref 0.00–0.07)
Basophils Absolute: 0 10*3/uL (ref 0.0–0.1)
Basophils Relative: 1 %
Eosinophils Absolute: 0.1 10*3/uL (ref 0.0–0.5)
Eosinophils Relative: 1 %
HCT: 49 % (ref 39.0–52.0)
Hemoglobin: 15.4 g/dL (ref 13.0–17.0)
Immature Granulocytes: 1 %
Lymphocytes Relative: 31 %
Lymphs Abs: 2.7 10*3/uL (ref 0.7–4.0)
MCH: 24.1 pg — ABNORMAL LOW (ref 26.0–34.0)
MCHC: 31.4 g/dL (ref 30.0–36.0)
MCV: 76.8 fL — ABNORMAL LOW (ref 80.0–100.0)
Monocytes Absolute: 0.5 10*3/uL (ref 0.1–1.0)
Monocytes Relative: 5 %
Neutro Abs: 5.2 10*3/uL (ref 1.7–7.7)
Neutrophils Relative %: 61 %
Platelets: 290 10*3/uL (ref 150–400)
RBC: 6.38 MIL/uL — ABNORMAL HIGH (ref 4.22–5.81)
RDW: 16.9 % — ABNORMAL HIGH (ref 11.5–15.5)
WBC: 8.5 10*3/uL (ref 4.0–10.5)
nRBC: 0 % (ref 0.0–0.2)

## 2022-05-22 LAB — COMPREHENSIVE METABOLIC PANEL
ALT: 23 U/L (ref 0–44)
AST: 17 U/L (ref 15–41)
Albumin: 4.3 g/dL (ref 3.5–5.0)
Alkaline Phosphatase: 62 U/L (ref 38–126)
Anion gap: 9 (ref 5–15)
BUN: 16 mg/dL (ref 6–20)
CO2: 25 mmol/L (ref 22–32)
Calcium: 9 mg/dL (ref 8.9–10.3)
Chloride: 101 mmol/L (ref 98–111)
Creatinine, Ser: 0.98 mg/dL (ref 0.61–1.24)
GFR, Estimated: >60 mL/min (ref >60)
Glucose, Bld: 97 mg/dL (ref 70–99)
Potassium: 3.5 mmol/L (ref 3.5–5.1)
Sodium: 135 mmol/L (ref 135–145)
Total Bilirubin: 0.8 mg/dL (ref 0.3–1.2)
Total Protein: 7.8 g/dL (ref 6.5–8.1)

## 2022-05-27 NOTE — Anesthesia Preprocedure Evaluation (Signed)
Anesthesia Evaluation  Patient identified by MRN, date of birth, ID band Patient awake    Reviewed: Allergy & Precautions, NPO status , Patient's Chart, lab work & pertinent test results  History of Anesthesia Complications (+) PONV and history of anesthetic complications  Airway Mallampati: III  TM Distance: >3 FB Neck ROM: Full    Dental  (+) Chipped, Dental Advisory Given,    Pulmonary asthma , sleep apnea and Continuous Positive Airway Pressure Ventilation    Pulmonary exam normal breath sounds clear to auscultation       Cardiovascular hypertension, Pt. on medications Normal cardiovascular exam Rhythm:Regular Rate:Normal     Neuro/Psych  Headaches PSYCHIATRIC DISORDERS Anxiety Depression       GI/Hepatic Neg liver ROS,GERD  Medicated,,  Endo/Other    Morbid obesity (BMI 49)  Renal/GU negative Renal ROS  negative genitourinary   Musculoskeletal  (+) Arthritis ,    Abdominal   Peds  Hematology negative hematology ROS (+)   Anesthesia Other Findings   Reproductive/Obstetrics                             Anesthesia Physical Anesthesia Plan  ASA: 3  Anesthesia Plan: General   Post-op Pain Management: Tylenol PO (pre-op)*, Ketamine IV* and Lidocaine infusion*   Induction: Intravenous  PONV Risk Score and Plan: 3 and Midazolam, Dexamethasone, Ondansetron, Scopolamine patch - Pre-op, Aprepitant and TIVA  Airway Management Planned: Oral ETT  Additional Equipment:   Intra-op Plan:   Post-operative Plan: Extubation in OR  Informed Consent: I have reviewed the patients History and Physical, chart, labs and discussed the procedure including the risks, benefits and alternatives for the proposed anesthesia with the patient or authorized representative who has indicated his/her understanding and acceptance.     Dental advisory given  Plan Discussed with: CRNA  Anesthesia Plan  Comments:        Anesthesia Quick Evaluation

## 2022-05-28 ENCOUNTER — Inpatient Hospital Stay (HOSPITAL_COMMUNITY)
Admission: RE | Admit: 2022-05-28 | Discharge: 2022-05-29 | DRG: 621 | Disposition: A | Discharge status: Home / Self Care | Payer: 59 | Attending: General Surgery | Admitting: General Surgery

## 2022-05-28 ENCOUNTER — Inpatient Hospital Stay (HOSPITAL_COMMUNITY): Payer: 59 | Admitting: Anesthesiology

## 2022-05-28 ENCOUNTER — Encounter (HOSPITAL_COMMUNITY): Payer: Self-pay | Admitting: General Surgery

## 2022-05-28 ENCOUNTER — Other Ambulatory Visit: Payer: Self-pay

## 2022-05-28 ENCOUNTER — Inpatient Hospital Stay (HOSPITAL_COMMUNITY): Payer: 59 | Admitting: Physician Assistant

## 2022-05-28 ENCOUNTER — Encounter (HOSPITAL_COMMUNITY)
Admission: RE | Disposition: A | Discharge status: Home / Self Care | Payer: Self-pay | Source: Home / Self Care | Attending: General Surgery

## 2022-05-28 DIAGNOSIS — Z9884 Bariatric surgery status: Principal | ICD-10-CM

## 2022-05-28 DIAGNOSIS — F32A Depression, unspecified: Secondary | ICD-10-CM | POA: Diagnosis present

## 2022-05-28 DIAGNOSIS — G4733 Obstructive sleep apnea (adult) (pediatric): Secondary | ICD-10-CM | POA: Diagnosis not present

## 2022-05-28 DIAGNOSIS — E7800 Pure hypercholesterolemia, unspecified: Secondary | ICD-10-CM | POA: Diagnosis present

## 2022-05-28 DIAGNOSIS — K219 Gastro-esophageal reflux disease without esophagitis: Secondary | ICD-10-CM | POA: Diagnosis present

## 2022-05-28 DIAGNOSIS — I10 Essential (primary) hypertension: Secondary | ICD-10-CM | POA: Diagnosis present

## 2022-05-28 DIAGNOSIS — Z833 Family history of diabetes mellitus: Secondary | ICD-10-CM

## 2022-05-28 DIAGNOSIS — Z79899 Other long term (current) drug therapy: Secondary | ICD-10-CM

## 2022-05-28 DIAGNOSIS — E781 Pure hyperglyceridemia: Secondary | ICD-10-CM | POA: Diagnosis present

## 2022-05-28 DIAGNOSIS — E8881 Metabolic syndrome: Secondary | ICD-10-CM

## 2022-05-28 DIAGNOSIS — F419 Anxiety disorder, unspecified: Secondary | ICD-10-CM | POA: Diagnosis present

## 2022-05-28 DIAGNOSIS — Z8249 Family history of ischemic heart disease and other diseases of the circulatory system: Secondary | ICD-10-CM

## 2022-05-28 DIAGNOSIS — Z7985 Long-term (current) use of injectable non-insulin antidiabetic drugs: Secondary | ICD-10-CM | POA: Diagnosis not present

## 2022-05-28 DIAGNOSIS — Z9989 Dependence on other enabling machines and devices: Secondary | ICD-10-CM

## 2022-05-28 DIAGNOSIS — Z79890 Hormone replacement therapy (postmenopausal): Secondary | ICD-10-CM

## 2022-05-28 DIAGNOSIS — Z6841 Body Mass Index (BMI) 40.0 and over, adult: Secondary | ICD-10-CM | POA: Diagnosis not present

## 2022-05-28 DIAGNOSIS — F909 Attention-deficit hyperactivity disorder, unspecified type: Secondary | ICD-10-CM | POA: Diagnosis present

## 2022-05-28 DIAGNOSIS — R12 Heartburn: Secondary | ICD-10-CM | POA: Diagnosis present

## 2022-05-28 DIAGNOSIS — E559 Vitamin D deficiency, unspecified: Secondary | ICD-10-CM | POA: Diagnosis present

## 2022-05-28 HISTORY — PX: GASTRIC ROUX-EN-Y: SHX5262

## 2022-05-28 HISTORY — PX: UPPER GI ENDOSCOPY: SHX6162

## 2022-05-28 LAB — TYPE AND SCREEN
ABO/RH(D): O POS
Antibody Screen: NEGATIVE

## 2022-05-28 LAB — CBC
HCT: 46.9 % (ref 39.0–52.0)
Hemoglobin: 15.1 g/dL (ref 13.0–17.0)
MCH: 24.7 pg — ABNORMAL LOW (ref 26.0–34.0)
MCHC: 32.2 g/dL (ref 30.0–36.0)
MCV: 76.6 fL — ABNORMAL LOW (ref 80.0–100.0)
Platelets: 303 10*3/uL (ref 150–400)
RBC: 6.12 MIL/uL — ABNORMAL HIGH (ref 4.22–5.81)
RDW: 16.5 % — ABNORMAL HIGH (ref 11.5–15.5)
WBC: 15 10*3/uL — ABNORMAL HIGH (ref 4.0–10.5)
nRBC: 0 % (ref 0.0–0.2)

## 2022-05-28 LAB — CREATININE, SERUM
Creatinine, Ser: 1.11 mg/dL (ref 0.61–1.24)
GFR, Estimated: >60 mL/min (ref >60)

## 2022-05-28 LAB — ABO/RH: ABO/RH(D): O POS

## 2022-05-28 SURGERY — LAPAROSCOPIC ROUX-EN-Y GASTRIC BYPASS WITH UPPER ENDOSCOPY
Anesthesia: General | Site: Abdomen

## 2022-05-28 MED ORDER — ROCURONIUM BROMIDE 10 MG/ML (PF) SYRINGE
PREFILLED_SYRINGE | INTRAVENOUS | Status: AC
  Filled 2022-05-28: qty 10

## 2022-05-28 MED ORDER — PROPOFOL 1000 MG/100ML IV EMUL
INTRAVENOUS | Status: AC
  Filled 2022-05-28: qty 100

## 2022-05-28 MED ORDER — HEPARIN SODIUM (PORCINE) 5000 UNIT/ML IJ SOLN
5000.0000 [IU] | INTRAMUSCULAR | Status: AC
  Administered 2022-05-28: 5000 [IU] via SUBCUTANEOUS
  Filled 2022-05-28: qty 1

## 2022-05-28 MED ORDER — KETAMINE HCL 10 MG/ML IJ SOLN
INTRAMUSCULAR | Status: DC | PRN
  Administered 2022-05-28: 30 mg via INTRAVENOUS

## 2022-05-28 MED ORDER — FENTANYL CITRATE (PF) 100 MCG/2ML IJ SOLN
INTRAMUSCULAR | Status: DC | PRN
  Administered 2022-05-28 (×2): 50 ug via INTRAVENOUS
  Administered 2022-05-28: 100 ug via INTRAVENOUS

## 2022-05-28 MED ORDER — MIDAZOLAM HCL 5 MG/5ML IJ SOLN
INTRAMUSCULAR | Status: DC | PRN
  Administered 2022-05-28: 2 mg via INTRAVENOUS

## 2022-05-28 MED ORDER — SODIUM CHLORIDE 0.9 % IV SOLN: 12.5000 mg | Freq: Four times a day (QID) | INTRAVENOUS | Status: DC | PRN

## 2022-05-28 MED ORDER — ONDANSETRON HCL 4 MG/2ML IJ SOLN
INTRAMUSCULAR | Status: AC
  Filled 2022-05-28: qty 2

## 2022-05-28 MED ORDER — HYDRALAZINE HCL 20 MG/ML IJ SOLN: 10.0000 mg | INTRAMUSCULAR | Status: DC | PRN

## 2022-05-28 MED ORDER — LIDOCAINE HCL 1 % IJ SOLN
INTRAMUSCULAR | Status: AC
  Filled 2022-05-28: qty 20

## 2022-05-28 MED ORDER — PROPOFOL 10 MG/ML IV BOLUS
INTRAVENOUS | Status: AC
  Filled 2022-05-28: qty 20

## 2022-05-28 MED ORDER — FENTANYL CITRATE PF 50 MCG/ML IJ SOSY
PREFILLED_SYRINGE | INTRAMUSCULAR | Status: AC
  Filled 2022-05-28: qty 3

## 2022-05-28 MED ORDER — FENTANYL CITRATE (PF) 100 MCG/2ML IJ SOLN
INTRAMUSCULAR | Status: AC
  Filled 2022-05-28: qty 2

## 2022-05-28 MED ORDER — KETAMINE HCL 50 MG/5ML IJ SOSY
PREFILLED_SYRINGE | INTRAMUSCULAR | Status: AC
  Filled 2022-05-28: qty 5

## 2022-05-28 MED ORDER — BUPIVACAINE LIPOSOME 1.3 % IJ SUSP
INTRAMUSCULAR | Status: DC | PRN
  Administered 2022-05-28: 20 mL

## 2022-05-28 MED ORDER — BUPIVACAINE-EPINEPHRINE 0.5% -1:200000 IJ SOLN
INTRAMUSCULAR | Status: DC | PRN
  Administered 2022-05-28: 30 mL

## 2022-05-28 MED ORDER — ACETAMINOPHEN 160 MG/5ML PO SOLN
1000.0000 mg | Freq: Three times a day (TID) | ORAL | Status: DC
  Administered 2022-05-28: 1000 mg via ORAL
  Filled 2022-05-28: qty 40.6

## 2022-05-28 MED ORDER — ONDANSETRON HCL 4 MG/2ML IJ SOLN
INTRAMUSCULAR | Status: AC
  Filled 2022-05-28: qty 4

## 2022-05-28 MED ORDER — ACETAMINOPHEN 500 MG PO TABS
1000.0000 mg | ORAL_TABLET | Freq: Three times a day (TID) | ORAL | Status: DC
  Filled 2022-05-28 (×3): qty 2

## 2022-05-28 MED ORDER — HEPARIN SODIUM (PORCINE) 5000 UNIT/ML IJ SOLN
5000.0000 [IU] | Freq: Three times a day (TID) | INTRAMUSCULAR | Status: DC
  Administered 2022-05-28 – 2022-05-29 (×3): 5000 [IU] via SUBCUTANEOUS
  Filled 2022-05-28 (×3): qty 1

## 2022-05-28 MED ORDER — SUGAMMADEX SODIUM 500 MG/5ML IV SOLN
INTRAVENOUS | Status: AC
  Filled 2022-05-28: qty 10

## 2022-05-28 MED ORDER — APREPITANT 40 MG PO CAPS
40.0000 mg | ORAL_CAPSULE | ORAL | Status: AC
  Administered 2022-05-28: 40 mg via ORAL
  Filled 2022-05-28: qty 1

## 2022-05-28 MED ORDER — DEXAMETHASONE SODIUM PHOSPHATE 10 MG/ML IJ SOLN
INTRAMUSCULAR | Status: AC
  Filled 2022-05-28: qty 2

## 2022-05-28 MED ORDER — ACETAMINOPHEN 500 MG PO TABS
1000.0000 mg | ORAL_TABLET | ORAL | Status: AC
  Filled 2022-05-28: qty 2

## 2022-05-28 MED ORDER — DEXAMETHASONE SODIUM PHOSPHATE 4 MG/ML IJ SOLN: 4.0000 mg | INTRAMUSCULAR | Status: DC

## 2022-05-28 MED ORDER — DEXAMETHASONE SODIUM PHOSPHATE 10 MG/ML IJ SOLN
INTRAMUSCULAR | Status: DC | PRN
  Administered 2022-05-28: 10 mg via INTRAVENOUS

## 2022-05-28 MED ORDER — ONDANSETRON HCL 4 MG/2ML IJ SOLN
INTRAMUSCULAR | Status: DC | PRN
  Administered 2022-05-28 (×2): 4 mg via INTRAVENOUS

## 2022-05-28 MED ORDER — ORAL CARE MOUTH RINSE: 15.0000 mL | Freq: Once | OROMUCOSAL | Status: AC

## 2022-05-28 MED ORDER — BUPIVACAINE LIPOSOME 1.3 % IJ SUSP: 20.0000 mL | Freq: Once | INTRAMUSCULAR | Status: DC

## 2022-05-28 MED ORDER — ACETAMINOPHEN 500 MG PO TABS
1000.0000 mg | ORAL_TABLET | Freq: Once | ORAL | Status: AC
  Administered 2022-05-28: 1000 mg via ORAL

## 2022-05-28 MED ORDER — MIDAZOLAM HCL 2 MG/2ML IJ SOLN
INTRAMUSCULAR | Status: AC
  Filled 2022-05-28: qty 2

## 2022-05-28 MED ORDER — ONDANSETRON HCL 4 MG/2ML IJ SOLN
4.0000 mg | Freq: Four times a day (QID) | INTRAMUSCULAR | Status: DC | PRN
  Administered 2022-05-28: 4 mg via INTRAVENOUS
  Filled 2022-05-28: qty 2

## 2022-05-28 MED ORDER — ROCURONIUM BROMIDE 10 MG/ML (PF) SYRINGE
PREFILLED_SYRINGE | INTRAVENOUS | Status: AC
  Filled 2022-05-28: qty 20

## 2022-05-28 MED ORDER — INDAPAMIDE 1.25 MG PO TABS
1.2500 mg | ORAL_TABLET | Freq: Every day | ORAL | Status: DC
  Administered 2022-05-29: 1.25 mg via ORAL
  Filled 2022-05-28 (×2): qty 1

## 2022-05-28 MED ORDER — SCOPOLAMINE 1 MG/3DAYS TD PT72
1.0000 | MEDICATED_PATCH | TRANSDERMAL | Status: DC
  Administered 2022-05-28: 1.5 mg via TRANSDERMAL
  Filled 2022-05-28: qty 1

## 2022-05-28 MED ORDER — LIDOCAINE HCL (PF) 2 % IJ SOLN
INTRAMUSCULAR | Status: AC
  Filled 2022-05-28: qty 10

## 2022-05-28 MED ORDER — PANTOPRAZOLE SODIUM 40 MG IV SOLR
40.0000 mg | Freq: Every day | INTRAVENOUS | Status: DC
  Administered 2022-05-28: 40 mg via INTRAVENOUS
  Filled 2022-05-28: qty 10

## 2022-05-28 MED ORDER — FENTANYL CITRATE PF 50 MCG/ML IJ SOSY
25.0000 ug | PREFILLED_SYRINGE | INTRAMUSCULAR | Status: DC | PRN
  Administered 2022-05-28: 50 ug via INTRAVENOUS

## 2022-05-28 MED ORDER — BUPIVACAINE LIPOSOME 1.3 % IJ SUSP
INTRAMUSCULAR | Status: AC
  Filled 2022-05-28: qty 20

## 2022-05-28 MED ORDER — PROPOFOL 10 MG/ML IV BOLUS
INTRAVENOUS | Status: DC | PRN
  Administered 2022-05-28: 150 mg via INTRAVENOUS
  Administered 2022-05-28: 50 mg via INTRAVENOUS

## 2022-05-28 MED ORDER — LACTATED RINGERS IR SOLN
Status: DC | PRN
  Administered 2022-05-28: 1000 mL

## 2022-05-28 MED ORDER — BUPIVACAINE-EPINEPHRINE (PF) 0.5% -1:200000 IJ SOLN
INTRAMUSCULAR | Status: AC
  Filled 2022-05-28: qty 30

## 2022-05-28 MED ORDER — LACTATED RINGERS IV SOLN: INTRAVENOUS | Status: DC | PRN

## 2022-05-28 MED ORDER — PHENYLEPHRINE 80 MCG/ML (10ML) SYRINGE FOR IV PUSH (FOR BLOOD PRESSURE SUPPORT)
PREFILLED_SYRINGE | INTRAVENOUS | Status: DC | PRN
  Administered 2022-05-28 (×3): 80 ug via INTRAVENOUS

## 2022-05-28 MED ORDER — SUGAMMADEX SODIUM 500 MG/5ML IV SOLN
INTRAVENOUS | Status: DC | PRN
  Administered 2022-05-28: 500 mg via INTRAVENOUS

## 2022-05-28 MED ORDER — DIPHENHYDRAMINE HCL 50 MG/ML IJ SOLN
INTRAMUSCULAR | Status: DC | PRN
  Administered 2022-05-28: 25 mg via INTRAVENOUS

## 2022-05-28 MED ORDER — SERTRALINE HCL 100 MG PO TABS
100.0000 mg | ORAL_TABLET | Freq: Every day | ORAL | Status: DC
  Administered 2022-05-29: 100 mg via ORAL
  Filled 2022-05-28: qty 1

## 2022-05-28 MED ORDER — "VISTASEAL 4 ML SINGLE DOSE KIT "
4.0000 mL | PACK | Freq: Once | CUTANEOUS | Status: AC
  Administered 2022-05-28: 4 mL via TOPICAL
  Filled 2022-05-28: qty 4

## 2022-05-28 MED ORDER — FIBRIN SEALANT 2 ML SINGLE DOSE KIT
2.0000 mL | PACK | Freq: Once | CUTANEOUS | Status: AC
  Administered 2022-05-28: 2 mL via TOPICAL
  Filled 2022-05-28: qty 2

## 2022-05-28 MED ORDER — SIMETHICONE 80 MG PO CHEW: 80.0000 mg | CHEWABLE_TABLET | Freq: Four times a day (QID) | ORAL | Status: DC | PRN

## 2022-05-28 MED ORDER — OXYCODONE HCL 5 MG/5ML PO SOLN
5.0000 mg | Freq: Four times a day (QID) | ORAL | Status: DC | PRN
  Administered 2022-05-28: 5 mg via ORAL
  Filled 2022-05-28: qty 5

## 2022-05-28 MED ORDER — CHLORHEXIDINE GLUCONATE 4 % EX LIQD: Freq: Once | CUTANEOUS | Status: DC

## 2022-05-28 MED ORDER — MORPHINE SULFATE (PF) 2 MG/ML IV SOLN: 1.0000 mg | INTRAVENOUS | Status: DC | PRN

## 2022-05-28 MED ORDER — IRBESARTAN 150 MG PO TABS
300.0000 mg | ORAL_TABLET | Freq: Every day | ORAL | Status: DC
  Administered 2022-05-29: 300 mg via ORAL
  Filled 2022-05-28: qty 2

## 2022-05-28 MED ORDER — PHENYLEPHRINE 80 MCG/ML (10ML) SYRINGE FOR IV PUSH (FOR BLOOD PRESSURE SUPPORT)
PREFILLED_SYRINGE | INTRAVENOUS | Status: AC
  Filled 2022-05-28: qty 10

## 2022-05-28 MED ORDER — DIPHENHYDRAMINE HCL 50 MG/ML IJ SOLN
INTRAMUSCULAR | Status: AC
  Filled 2022-05-28: qty 1

## 2022-05-28 MED ORDER — 0.9 % SODIUM CHLORIDE (POUR BTL) OPTIME
TOPICAL | Status: DC | PRN
  Administered 2022-05-28: 1000 mL

## 2022-05-28 MED ORDER — DIPHENHYDRAMINE HCL 50 MG/ML IJ SOLN: 12.5000 mg | Freq: Three times a day (TID) | INTRAMUSCULAR | Status: DC | PRN

## 2022-05-28 MED ORDER — LIDOCAINE 2% (20 MG/ML) 5 ML SYRINGE
INTRAMUSCULAR | Status: DC | PRN
  Administered 2022-05-28: 100 mg via INTRAVENOUS
  Administered 2022-05-28: 1.5 mg/kg/h via INTRAVENOUS

## 2022-05-28 MED ORDER — PROPOFOL 500 MG/50ML IV EMUL
INTRAVENOUS | Status: AC
  Filled 2022-05-28: qty 50

## 2022-05-28 MED ORDER — ENSURE MAX PROTEIN PO LIQD
2.0000 [oz_av] | ORAL | Status: DC
  Administered 2022-05-29 (×4): 2 [oz_av] via ORAL

## 2022-05-28 MED ORDER — PROPOFOL 500 MG/50ML IV EMUL
INTRAVENOUS | Status: DC | PRN
  Administered 2022-05-28: 150 ug/kg/min via INTRAVENOUS

## 2022-05-28 MED ORDER — ROCURONIUM BROMIDE 10 MG/ML (PF) SYRINGE
PREFILLED_SYRINGE | INTRAVENOUS | Status: DC | PRN
  Administered 2022-05-28: 100 mg via INTRAVENOUS
  Administered 2022-05-28: 40 mg via INTRAVENOUS
  Administered 2022-05-28: 20 mg via INTRAVENOUS

## 2022-05-28 MED ORDER — CHLORHEXIDINE GLUCONATE 0.12 % MT SOLN
15.0000 mL | Freq: Once | OROMUCOSAL | Status: AC
  Administered 2022-05-28: 15 mL via OROMUCOSAL

## 2022-05-28 MED ORDER — SODIUM CHLORIDE 0.9 % IV SOLN
2.0000 g | INTRAVENOUS | Status: AC
  Administered 2022-05-28: 2 g via INTRAVENOUS
  Filled 2022-05-28: qty 2

## 2022-05-28 MED ORDER — KCL IN DEXTROSE-NACL 20-5-0.45 MEQ/L-%-% IV SOLN
INTRAVENOUS | Status: DC
  Filled 2022-05-28 (×4): qty 1000

## 2022-05-28 MED ORDER — LACTATED RINGERS IV SOLN: INTRAVENOUS | Status: DC

## 2022-05-28 SURGICAL SUPPLY — 77 items
ANTIFOG SOL W/FOAM PAD STRL (MISCELLANEOUS) ×2
APPLICATOR COTTON TIP 6 STRL (MISCELLANEOUS) IMPLANT
APPLICATOR COTTON TIP 6IN STRL (MISCELLANEOUS)
APPLICATOR VISTASEAL 35 (MISCELLANEOUS) ×4 IMPLANT
APPLIER CLIP ROT 13.4 12 LRG (CLIP)
BAG COUNTER SPONGE SURGICOUNT (BAG) IMPLANT
BLADE SURG SZ11 CARB STEEL (BLADE) ×2 IMPLANT
CABLE HIGH FREQUENCY MONO STRZ (ELECTRODE) IMPLANT
CHLORAPREP W/TINT 26 (MISCELLANEOUS) ×4 IMPLANT
CLIP APPLIE ROT 13.4 12 LRG (CLIP) IMPLANT
CLIP SUT LAPRA TY ABSORB (SUTURE) ×4 IMPLANT
CUTTER FLEX LINEAR 45M (STAPLE) IMPLANT
DEVICE SUT QUICK LOAD TK 5 (SUTURE) IMPLANT
DEVICE SUT TI-KNOT TK 5X26 (SUTURE) IMPLANT
DEVICE SUTURE ENDOST 10MM (ENDOMECHANICALS) ×2 IMPLANT
DRAIN PENROSE 0.25X18 (DRAIN) ×2 IMPLANT
DRSG TEGADERM 2-3/8X2-3/4 SM (GAUZE/BANDAGES/DRESSINGS) ×12 IMPLANT
ELECT REM PT RETURN 15FT ADLT (MISCELLANEOUS) ×2 IMPLANT
GAUZE 4X4 16PLY ~~LOC~~+RFID DBL (SPONGE) ×2 IMPLANT
GAUZE SPONGE 2X2 8PLY STRL LF (GAUZE/BANDAGES/DRESSINGS) ×2 IMPLANT
GAUZE SPONGE 4X4 12PLY STRL (GAUZE/BANDAGES/DRESSINGS) IMPLANT
GLOVE BIO SURGEON STRL SZ7.5 (GLOVE) ×2 IMPLANT
GLOVE INDICATOR 8.0 STRL GRN (GLOVE) ×2 IMPLANT
GOWN STRL REUS W/ TWL XL LVL3 (GOWN DISPOSABLE) ×8 IMPLANT
GOWN STRL REUS W/TWL XL LVL3 (GOWN DISPOSABLE) ×8
IRRIG SUCT STRYKERFLOW 2 WTIP (MISCELLANEOUS) ×2
IRRIGATION SUCT STRKRFLW 2 WTP (MISCELLANEOUS) ×2 IMPLANT
KIT BASIN OR (CUSTOM PROCEDURE TRAY) ×2 IMPLANT
KIT GASTRIC LAVAGE 34FR ADT (SET/KITS/TRAYS/PACK) ×2 IMPLANT
KIT TURNOVER KIT A (KITS) IMPLANT
MARKER SKIN DUAL TIP RULER LAB (MISCELLANEOUS) ×2 IMPLANT
MAT PREVALON FULL STRYKER (MISCELLANEOUS) ×2 IMPLANT
NDL SPNL 22GX3.5 QUINCKE BK (NEEDLE) ×2 IMPLANT
NEEDLE SPNL 22GX3.5 QUINCKE BK (NEEDLE) ×2 IMPLANT
PACK CARDIOVASCULAR III (CUSTOM PROCEDURE TRAY) ×2 IMPLANT
RELOAD 45 VASCULAR/THIN (ENDOMECHANICALS) IMPLANT
RELOAD ENDO STITCH 2.0 (ENDOMECHANICALS) ×18
RELOAD STAPLE 45 2.5 WHT GRN (ENDOMECHANICALS) IMPLANT
RELOAD STAPLE 45 3.5 BLU ETS (ENDOMECHANICALS) IMPLANT
RELOAD STAPLE 60 2.6 WHT THN (STAPLE) ×4 IMPLANT
RELOAD STAPLE 60 3.6 BLU REG (STAPLE) ×4 IMPLANT
RELOAD STAPLE 60 3.8 GOLD REG (STAPLE) ×2 IMPLANT
RELOAD STAPLE TA45 3.5 REG BLU (ENDOMECHANICALS) IMPLANT
RELOAD STAPLER BLUE 60MM (STAPLE) ×8 IMPLANT
RELOAD STAPLER GOLD 60MM (STAPLE) ×2 IMPLANT
RELOAD STAPLER WHITE 60MM (STAPLE) ×4 IMPLANT
RELOAD SUT SNGL STCH ABSRB 2-0 (ENDOMECHANICALS) ×10 IMPLANT
RELOAD SUT SNGL STCH BLK 2-0 (ENDOMECHANICALS) ×8 IMPLANT
SCISSORS LAP 5X45 EPIX DISP (ENDOMECHANICALS) ×2 IMPLANT
SET TUBE SMOKE EVAC HIGH FLOW (TUBING) ×2 IMPLANT
SHEARS HARMONIC ACE PLUS 45CM (MISCELLANEOUS) ×2 IMPLANT
SLEEVE ADV FIXATION 12X100MM (TROCAR) ×4 IMPLANT
SLEEVE ADV FIXATION 5X100MM (TROCAR) IMPLANT
SOLUTION ANTFG W/FOAM PAD STRL (MISCELLANEOUS) ×2 IMPLANT
STAPLER ECHELON BIOABSB 60 FLE (MISCELLANEOUS) IMPLANT
STAPLER ECHELON LONG 60 440 (INSTRUMENTS) ×2 IMPLANT
STAPLER RELOAD BLUE 60MM (STAPLE) ×8
STAPLER RELOAD GOLD 60MM (STAPLE) ×2
STAPLER RELOAD WHITE 60MM (STAPLE) ×4
STRIP CLOSURE SKIN 1/2X4 (GAUZE/BANDAGES/DRESSINGS) ×2 IMPLANT
SURGILUBE 2OZ TUBE FLIPTOP (MISCELLANEOUS) ×2 IMPLANT
SUT MNCRL AB 4-0 PS2 18 (SUTURE) ×2 IMPLANT
SUT RELOAD ENDO STITCH 2 48X1 (ENDOMECHANICALS) ×10
SUT RELOAD ENDO STITCH 2.0 (ENDOMECHANICALS) ×8
SUT SURGIDAC NAB ES-9 0 48 120 (SUTURE) IMPLANT
SUT VIC AB 2-0 SH 27 (SUTURE) ×2
SUT VIC AB 2-0 SH 27X BRD (SUTURE) ×2 IMPLANT
SUTURE RELOAD END STTCH 2 48X1 (ENDOMECHANICALS) ×10 IMPLANT
SUTURE RELOAD ENDO STITCH 2.0 (ENDOMECHANICALS) ×8 IMPLANT
SYR 20ML LL LF (SYRINGE) ×4 IMPLANT
TOWEL OR 17X26 10 PK STRL BLUE (TOWEL DISPOSABLE) ×2 IMPLANT
TOWEL OR NON WOVEN STRL DISP B (DISPOSABLE) ×2 IMPLANT
TRAY FOLEY MTR SLVR 16FR STAT (SET/KITS/TRAYS/PACK) IMPLANT
TROCAR ADV FIXATION 12X100MM (TROCAR) ×2 IMPLANT
TROCAR ADV FIXATION 5X100MM (TROCAR) ×2 IMPLANT
TROCAR XCEL NON-BLD 5MMX100MML (ENDOMECHANICALS) ×2 IMPLANT
TUBING CONNECTING 10 (TUBING) ×4 IMPLANT

## 2022-05-28 NOTE — Progress Notes (Signed)
PHARMACY CONSULT FOR:  Risk Assessment for Post-Discharge VTE Following Bariatric Surgery  Post-Discharge VTE Risk Assessment: This patient's probability of 30-day post-discharge VTE is increased due to the factors marked:  Sleeve gastrectomy   Liver disorder (transplant, cirrhosis, or nonalcoholic steatohepatitis)   Hx of VTE   Hemorrhage requiring transfusion   GI perforation, leak, or obstruction   ====================================================   x Male    Age >/=60 years    BMI >/=50 kg/m2    CHF    Dyspnea at Rest    Paraplegia  x  Non-gastric-band surgery    Operation Time >/=3 hr    Return to OR     Length of Stay >/= 3 d   Hypercoagulable condition   Significant venous stasis      Predicted probability of 30-day post-discharge VTE: - 0.31%   Other patient-specific factors to consider: - No noted history of PE/DVT  Recommendation for Discharge: No pharmacologic prophylaxis post-discharge      Frederick Snyder is a 36 y.o. 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 User may not have seen previous data.) IBW/kg (Calculated) : 77.6 Body mass index is 48.95 kg/m.  Recent Labs    05/28/22 1219  WBC 15.0*  HGB 15.1  HCT 46.9  PLT 303  CREATININE 1.11   Estimated Creatinine Clearance: 145.7 mL/min (by C-G formula based on SCr of 1.11 mg/dL).    Past Medical History:  Diagnosis Date   Anemia    Anxiety    Asthma    Depression    GERD (gastroesophageal reflux disease)    History of kidney stones    Hyperlipidemia    diet controlled - no meds   Hypertension    Migraine    OSA on CPAP 05/03/2021   PONV (postoperative nausea and vomiting)    Vitamin D deficiency      Medications Prior to Admission  Medication Sig Dispense Refill Last Dose    acetaminophen (TYLENOL) 500 MG tablet Take 500-1,000 mg by mouth every 6 (six) hours as needed (pain.).   Past Month   Cholecalciferol (VITAMIN D-3 PO) Take 1 tablet by mouth at bedtime.   05/27/2022   indapamide (LOZOL) 1.25 MG tablet Take 1 tablet (1.25 mg total) by mouth daily. 90 tablet 1 05/27/2022   lisdexamfetamine (VYVANSE) 50 MG capsule Take 1 capsule (50 mg total) by mouth daily. 30 capsule 0    montelukast (SINGULAIR) 10 MG tablet Take 1 tablet (10 mg total) by mouth at bedtime. 90 tablet 1 05/27/2022   omeprazole (PRILOSEC) 20 MG capsule Take 20 mg by mouth daily before breakfast.   05/28/2022 at 0400   sertraline (ZOLOFT) 100 MG tablet Take 1 tablet (100 mg total) by mouth daily. 90 tablet 1  at 0400   valsartan (DIOVAN) 320 MG tablet Take 1 tablet (320 mg total) by mouth daily. 90 tablet 1 05/27/2022   Semaglutide, 1 MG/DOSE, 4 MG/3ML SOPN Inject 1 mg as directed once a week. (Patient not taking: Reported on 05/17/2022) 9 mL 0 Not Taking   Testosterone Enanthate (XYOSTED) 100 MG/0.5ML SOAJ Inject 1 Act into the skin once a week. 6 mL 1        Adalberto Cole, PharmD, BCPS 05/28/2022 1:35 PM

## 2022-05-28 NOTE — Anesthesia Procedure Notes (Signed)
Procedure Name: Intubation Date/Time: 05/28/2022 7:40 AM  Performed by: Lavina Hamman, CRNAPre-anesthesia Checklist: Patient identified, Emergency Drugs available, Suction available, Patient being monitored and Timeout performed Patient Re-evaluated:Patient Re-evaluated prior to induction Oxygen Delivery Method: Circle system utilized Preoxygenation: Pre-oxygenation with 100% oxygen Induction Type: IV induction and Cricoid Pressure applied Ventilation: Two handed mask ventilation required and Oral airway inserted - appropriate to patient size Laryngoscope Size: Mac and 4 Grade View: Grade III Tube type: Oral Tube size: 7.5 mm Number of attempts: 1 Airway Equipment and Method: Stylet Placement Confirmation: ETT inserted through vocal cords under direct vision, positive ETCO2, CO2 detector and breath sounds checked- equal and bilateral Secured at: 22 cm Tube secured with: Tape Dental Injury: Teeth and Oropharynx as per pre-operative assessment  Comments: ATOI, anterior view, recommend Glidescope, 2 person mask due to beard.

## 2022-05-28 NOTE — Progress Notes (Signed)
Discussed QI "Goals for Discharge" document with patient including ambulation in halls, Incentive Spirometry use every hour, and oral care.  Also discussed pain and nausea control.  BSTOP education provided including BSTOP information guide, "Guide for Pain Management after your Bariatric Procedure".  Diet progression education provided including "Bariatric Surgery Post-Op Food Plan Phase 1: Liquids".  Questions answered.  Will continue to partner with bedside RN and follow up with patient per protocol.  

## 2022-05-28 NOTE — Addendum Note (Signed)
Addendum  created 05/28/22 1413 by Theodosia Quay, CRNA   Flowsheet accepted, Intraprocedure Flowsheets edited

## 2022-05-28 NOTE — Transfer of Care (Signed)
Immediate Anesthesia Transfer of Care Note  Patient: Frederick Snyder  Procedure(s) Performed: Procedure(s): LAPAROSCOPIC ROUX-EN-Y GASTRIC BYPASS WITH UPPER ENDOSCOPY (N/A) UPPER GI ENDOSCOPY (N/A)  Patient Location: PACU  Anesthesia Type:General  Level of Consciousness:  sedated, patient cooperative and responds to stimulation  Airway & Oxygen Therapy:Patient Spontanous Breathing and Patient connected to face mask oxgen  Post-op Assessment:  Report given to PACU RN and Post -op Vital signs reviewed and stable  Post vital signs:  Reviewed and stable  Last Vitals:  Vitals:   05/28/22 0602  BP: (!) 176/56  Pulse: 78  Resp: 18  Temp: 36.6 C  SpO2: 94%    Complications: No apparent anesthesia complications

## 2022-05-28 NOTE — H&P (Signed)
CC: here for surgery  Requesting provider: n/a  HPI: Frederick Snyder is an 36 y.o. male who is here for lap RYGB. Denies changes since seen in clinic in late October other than change in Zoloft dose.   No abd pain, cp/sob/doe.   His comorbidities include hypertension, hypercholesterolemia, hypertriglyceridemia, obstructive sleep apnea on CPAP, heartburn, prediabetes, asthma, migraines  I initially met him on May 4 to discuss bariatric surgery. He has completed the bariatric surgery evaluation process.  He does have heartburn and takes Prilosec daily.  He has a [employer] fast streamer at [employer]  He denies any chest pain, chest pressure, shortness of breath, abdominal pain, diarrhea or constipation.  He is tolerating Ozempic without side effects.   Past Medical History:  Diagnosis Date   Anemia    Anxiety    Asthma    Depression    GERD (gastroesophageal reflux disease)    History of kidney stones    Hyperlipidemia    diet controlled - no meds   Hypertension    Migraine    OSA on CPAP 05/03/2021   PONV (postoperative nausea and vomiting)    Vitamin D deficiency     Past Surgical History:  Procedure Laterality Date   INGUINAL HERNIA REPAIR Left 12/08/2020   Procedure: OPEN HERNIA REPAIR INGUINAL ADULT;  Surgeon: Coralie Keens, MD;  Location: [place];  Service: General;  Laterality: Left;  TAP BLOCK AND LMA.   INSERTION OF MESH Left 12/08/2020   Procedure: INSERTION OF MESH;  Surgeon: Coralie Keens, MD;  Location: [place];  Service: General;  Laterality: Left;   KNEE SURGERY     tubes in ears     WISDOM TOOTH EXTRACTION      Family History  Problem Relation Age of Onset   Diabetes Mother    Heart disease Mother    Sleep apnea Mother    Diabetes Father    Heart disease Father    Hypertension Father    Sleep apnea Father    Diabetes Other    Hyperlipidemia Other    Hypertension Other    Other Neg Hx        hypogonadism    Social:  reports that he  has never smoked. He has never used smokeless tobacco. He reports that he does not drink alcohol and does not use drugs.  Allergies: No Known Allergies  Medications: I have reviewed the patient's current medications.   ROS - all of the below systems have been reviewed with the patient and positives are indicated with bold text General: chills, fever or night sweats Eyes: blurry vision or double vision ENT: epistaxis or sore throat Allergy/Immunology: itchy/watery eyes or nasal congestion Hematologic/Lymphatic: bleeding problems, blood clots or swollen lymph nodes Endocrine: temperature intolerance or unexpected weight changes Breast: new or changing breast lumps or nipple discharge Resp: cough, shortness of breath, or wheezing CV: chest pain or dyspnea on exertion GI: as per HPI GU: dysuria, trouble voiding, or hematuria MSK: joint pain or joint stiffness Neuro: TIA or stroke symptoms Derm: pruritus and skin lesion changes Psych: anxiety and depression  PE Blood pressure (!) 176/56, pulse 78, temperature 97.8 F (36.6 C), temperature source Oral, resp. rate 18, height 6' (1.829 m), weight (!) 163.7 kg, SpO2 94 %. Constitutional: NAD; conversant; no deformities Eyes: Moist conjunctiva; no lid lag; anicteric; PERRL Neck: Trachea midline; no thyromegaly Lungs: Normal respiratory effort; no tactile fremitus CV: RRR; no palpable thrills; no pitting edema GI: Abd soft, nt, nd; no  palpable hepatosplenomegaly MSK: Normal gait; no clubbing/cyanosis Psychiatric: Appropriate affect; alert and oriented x3 Lymphatic: No palpable cervical or axillary lymphadenopathy Skin:no rash/lesions/jaundice  No results found for this or any previous visit (from the past 3 hour(s)).  No results found.  Imaging: reivewed  A/P: Frederick Snyder is an 36 y.o. male with  Severe obesity (CMS-HCC)  Prediabetes  Metabolic syndrome  Primary hypertension  OSA on CPAP  Vitamin D deficiency  disease  Attention deficit hyperactivity disorder (ADHD), unspecified ADHD type  Low HDL (under 40)  Pure hypertriglyceridemia   To Or for LRYGB, upper endo IV abx ERAS   Leighton Ruff. Redmond Pulling, MD, FACS General, Bariatric, & Minimally Invasive Surgery [place]

## 2022-05-28 NOTE — Op Note (Signed)
Preoperative diagnosis: Roux-en-Y gastric bypass  Postoperative diagnosis: Same   Procedure: Upper endoscopy   Surgeon: Berna Bue, M.D.  Anesthesia: Gen.   Description of procedure: The endoscope was placed in the mouth and oropharynx and under endoscopic vision it was advanced to the esophagogastric junction which was identified at 40cm from the teeth.  The pouch was tensely insufflated while the roux limb was clamped with a bowel clamp and the upper abdomen was flooded with irrigation to perform a leak test, which was negative. No bubbles were seen.  The staple line and anastomosis are hemostatic; the anastomosis is visibly patent. The pouch is evenly tubular without any retained fundus, and measures approximately 6-7cm in length while fully distended.  The lumen was decompressed and the scope was withdrawn without difficulty.    Berna Bue, M.D. General, Bariatric, & Minimally Invasive Surgery [facility]

## 2022-05-28 NOTE — Op Note (Signed)
MAREK NGHIEM [id number] 1985-12-07. 05/28/2022  Preoperative diagnosis:  Severe obesity BMI 49 Prediabetes  Metabolic syndrome  Primary hypertension  OSA on CPAP  Vitamin D deficiency disease  Attention deficit hyperactivity disorder (ADHD), unspecified ADHD type  Low HDL (under 40)  Pure hypertriglyceridemia   Postoperative  diagnosis:  1. same  Surgical procedure: Laparoscopic Roux-en-Y gastric bypass (ante-colic, ante-gastric); upper endoscopy  Surgeon: Atilano Ina, M.D. FACS  Asst.: Phylliss Blakes MD FACS; Lynford Citizen RNFA  Anesthesia: General plus exparel/marcaine mix  Complications: None   EBL: Minimal   Drains: None   Disposition: PACU in good condition   Indications for procedure: 36 y.o. yo male  with morbid obesity who has been unsuccessful at sustained weight loss. The patient's comorbidities are listed above. We discussed the risk and benefits of surgery including but not limited to anesthesia risk, bleeding, infection, blood clot formation, anastomotic leak, anastomotic stricture, ulcer formation, death, respiratory complications, intestinal blockage, internal hernia, gallstone formation, vitamin and nutritional deficiencies, injury to surrounding structures, failure to lose weight and mood changes.   Description of procedure: Patient is brought to the operating room and general anesthesia induced. The patient had received preoperative broad-spectrum IV antibiotics and subcutaneous heparin. The abdomen was widely sterilely prepped with Chloraprep and draped. Patient timeout was performed and correct patient and procedure confirmed. Access was obtained with a 5 mm Optiview trocar in the left upper quadrant and pneumoperitoneum established without difficulty. Under direct vision 12 mm trocars were placed laterally in the right upper quadrant, right upper quadrant midclavicular line, and to the left and above the umbilicus for the camera port. A 5 mm  trocar was placed laterally in the left upper quadrant.  Exparel/marcaine mix was infiltrated in bilateral lateral abdominal walls as a TAP block for postoperative pain relief.   The omentum was brought into the upper abdomen and the transverse mesocolon elevated and the ligament of Treitz clearly identified. A 50 cm biliopancreatic limb was then carefully measured from the ligament of Treitz. The small intestine was divided at this point with a single firing of the white load linear stapler. A Penrose drain was sutured to the end of the Roux-en-Y limb for later identification. A 100 cm Roux-en-Y limb was then carefully measured. At this point a side-to-side anastomosis was created between the Roux limb and the end of the biliopancreatic limb. This was accomplished with a single firing of the 60 mm white load linear stapler. The common enterotomy was closed with a running 2-0 Vicryl begun at either end of the enterotomy and tied centrally. VIstaseal tissue sealant was placed over the anastomosis. The mesenteric defect was then closed with running 2-0 silk. The omentum was then divided with the harmonic scalpel up towards the transverse colon to allow mobility of the Roux limb toward the gastric pouch. The patient was then placed in steep reversed Trendelenburg. Through a 5 mm subxiphoid site the [facility] retractor was placed and the left lobe of the liver elevated with excellent exposure of the upper stomach and hiatus. The angle of Hiss was then mobilized with the harmonic scalpel. A 5-6cm gastric pouch was then carefully measured along the lesser curve of the stomach. Dissection was carried along the lesser curve at this point with the Harmonic scalpel working carefully back toward the lesser sac at right angles to the lesser curve. The free lesser sac was then entered. After being sure all tubes were removed from the stomach an initial firing of the gold  load 60 mm linear stapler was fired at right angles across  the lesser curve for about 4 cm. The gastric pouch was further mobilized posteriorly and then the pouch was completed with 3 further firings of the 60 mm blue load linear stapler up through the previously dissected angle of His. It was ensured that the pouch was completely mobilized away from the gastric remnant. This created a nice tubular 5 cm gastric pouch. The Roux limb was then brought up in an antecolic fashion with the candycane facing to the patient's left without undue tension. The gastrojejunostomy was created with an initial posterior row of 2-0 Vicryl between the Roux limb and the staple line of the gastric pouch. Enterotomies were then made in the gastric pouch and the Roux limb with the harmonic scalpel and at approximately 2-2-1/2 cm anastomosis was created with a single firing of the 74mm blue load linear stapler. The staple line was inspected and was intact without bleeding. The common enterotomy was then closed with running 2-0 Vicryl begun at either end and tied centrally. The Ewall tube was then easily passed through the anastomosis and an outer anterior layer of running 2-0 Vicryl was placed. The Ewald tube was removed. With the outlet of the gastrojejunostomy clamped and under saline irrigation the assistant performed upper endoscopy and with the gastric pouch tensely distended with air-there was no evidence of leak on this test. The pouch was desufflated. The Vonita Moss defect was closed with running 2-0 silk. The abdomen was inspected for any evidence of bleeding or bowel injury and everything looked fine. The Nathanson retractor was removed under direct vision after coating the anastomosis with Vistaseal tissue sealant. All CO2 was evacuated and trochars removed. Skin incisions were closed with 4-0 monocryl in a subcuticular fashion followed by  steri-strips and bandages. Sponge needle and instrument counts were correct. The patient was taken to the PACU in good condition.    Mary Sella. Andrey Campanile,  MD, FACS General, Bariatric, & Minimally Invasive Surgery [facility], [place]

## 2022-05-28 NOTE — Discharge Instructions (Signed)

## 2022-05-28 NOTE — Anesthesia Postprocedure Evaluation (Signed)
Anesthesia Post Note  Patient: Frederick Snyder  Procedure(s) Performed: LAPAROSCOPIC ROUX-EN-Y GASTRIC BYPASS WITH UPPER ENDOSCOPY (Abdomen) UPPER GI ENDOSCOPY     Patient location during evaluation: PACU Anesthesia Type: General Level of consciousness: awake and alert Pain management: pain level controlled Vital Signs Assessment: post-procedure vital signs reviewed and stable Respiratory status: spontaneous breathing, nonlabored ventilation, respiratory function stable and patient connected to nasal cannula oxygen Cardiovascular status: blood pressure returned to baseline and stable Postop Assessment: no apparent nausea or vomiting Anesthetic complications: no  No notable events documented.  Last Vitals:  Vitals:   05/28/22 1130 05/28/22 1202  BP: (!) 143/80 (!) 115/93  Pulse: (!) 55 68  Resp: 15 16  Temp:  (!) 36.3 C  SpO2: 100% 95%    Last Pain:  Vitals:   05/28/22 1202  TempSrc: Oral  PainSc:                  Goldie Dimmer L Amous Crewe

## 2022-05-29 ENCOUNTER — Encounter (HOSPITAL_COMMUNITY): Payer: Self-pay | Admitting: General Surgery

## 2022-05-29 ENCOUNTER — Other Ambulatory Visit (HOSPITAL_COMMUNITY): Payer: Self-pay

## 2022-05-29 LAB — CBC WITH DIFFERENTIAL/PLATELET
Abs Immature Granulocytes: 0.05 10*3/uL (ref 0.00–0.07)
Basophils Absolute: 0 10*3/uL (ref 0.0–0.1)
Basophils Relative: 0 %
Eosinophils Absolute: 0 10*3/uL (ref 0.0–0.5)
Eosinophils Relative: 0 %
HCT: 44.2 % (ref 39.0–52.0)
Hemoglobin: 13.9 g/dL (ref 13.0–17.0)
Immature Granulocytes: 1 %
Lymphocytes Relative: 23 %
Lymphs Abs: 2.6 10*3/uL (ref 0.7–4.0)
MCH: 24.5 pg — ABNORMAL LOW (ref 26.0–34.0)
MCHC: 31.4 g/dL (ref 30.0–36.0)
MCV: 78 fL — ABNORMAL LOW (ref 80.0–100.0)
Monocytes Absolute: 0.7 10*3/uL (ref 0.1–1.0)
Monocytes Relative: 6 %
Neutro Abs: 7.7 10*3/uL (ref 1.7–7.7)
Neutrophils Relative %: 70 %
Platelets: 305 10*3/uL (ref 150–400)
RBC: 5.67 MIL/uL (ref 4.22–5.81)
RDW: 16.4 % — ABNORMAL HIGH (ref 11.5–15.5)
WBC: 11.1 10*3/uL — ABNORMAL HIGH (ref 4.0–10.5)
nRBC: 0 % (ref 0.0–0.2)

## 2022-05-29 LAB — COMPREHENSIVE METABOLIC PANEL
ALT: 23 U/L (ref 0–44)
AST: 19 U/L (ref 15–41)
Albumin: 3.7 g/dL (ref 3.5–5.0)
Alkaline Phosphatase: 52 U/L (ref 38–126)
Anion gap: 7 (ref 5–15)
BUN: 10 mg/dL (ref 6–20)
CO2: 28 mmol/L (ref 22–32)
Calcium: 8.8 mg/dL — ABNORMAL LOW (ref 8.9–10.3)
Chloride: 102 mmol/L (ref 98–111)
Creatinine, Ser: 1.07 mg/dL (ref 0.61–1.24)
GFR, Estimated: >60 mL/min (ref >60)
Glucose, Bld: 126 mg/dL — ABNORMAL HIGH (ref 70–99)
Potassium: 3.6 mmol/L (ref 3.5–5.1)
Sodium: 137 mmol/L (ref 135–145)
Total Bilirubin: 0.7 mg/dL (ref 0.3–1.2)
Total Protein: 7.1 g/dL (ref 6.5–8.1)

## 2022-05-29 MED ORDER — ONDANSETRON 4 MG PO TBDP
4.0000 mg | ORAL_TABLET | Freq: Four times a day (QID) | ORAL | 0 refills | Status: DC | PRN
  Filled 2022-05-29: qty 20, 5d supply, fill #0

## 2022-05-29 MED ORDER — OXYCODONE HCL 5 MG PO TABS
5.0000 mg | ORAL_TABLET | Freq: Four times a day (QID) | ORAL | 0 refills | Status: DC | PRN
  Filled 2022-05-29: qty 10, 3d supply, fill #0

## 2022-05-29 NOTE — Progress Notes (Signed)
Pt given d/c instructions and all questions answered. Pt taken via wheelchair to front entrance to meet ride. 

## 2022-05-29 NOTE — Discharge Summary (Signed)
Physician Discharge Summary  Frederick Snyder KLK:[id number] DOB: 07/02/1985 DOA: 05/28/2022  PCP: Frederick Grandchild, MD  Admit date: 05/28/2022 Discharge date: 05/29/22  Recommendations for Outpatient Follow-up:     Follow-up Information     Frederick Adu, MD. Go on 06/27/2022.   Specialty: General Surgery Why: at 9:30am. Please arrive 15 minutes prior to your appointment time. Thank you. Contact information: [address] [place] [address] [phone]         Snyder, Frederick Slade, [place]. Go on 07/13/2022.   Specialty: General Surgery Why: at 9:15am for Dr. Andrey Campanile. Please arrive 15 minutes prior to your appointment time. Thank you. Contact information: [address] [place] [address] [phone]                Discharge Diagnoses:  Principal Problem:   S/P gastric bypass Severe obesity BMI 49 Prediabetes  Metabolic syndrome  Primary hypertension  OSA on CPAP  Vitamin D deficiency disease  Attention deficit hyperactivity disorder (ADHD), unspecified ADHD type  Low HDL (under 40)  Pure hypertriglyceridemia   Surgical Procedure: Laparoscopic Roux-en-Y gastric bypass, upper endoscopy  Discharge Condition: Good Disposition: Home  Diet recommendation: Postoperative gastric bypass diet  Filed Weights   05/28/22 0602  Weight: (!) 163.7 kg     Hospital Course:  The patient was admitted for a planned laparoscopic Roux-en-Y gastric bypass. Please see operative note. Preoperatively the patient was given 5000 units of subcutaneous heparin for DVT prophylaxis. ERAS protocol was used. Postoperative prophylactic heparin dosing was started on the evening of postoperative day 0.  The patient was started on ice chips and water on the evening of POD 0 which they tolerated. On postoperative day 1 The patient's diet was advanced to protein shakes which they also tolerated. On POD 1, The patient was ambulating without difficulty.  Their vital signs are stable without fever or tachycardia. Their hemoglobin had remained stable. The patient was maintained on their home settings for CPAP therapy. The patient had received discharge instructions and counseling. They were deemed stable for discharge.  BP 116/66 (BP Location: Right Arm)   Pulse (!) 52   Temp 98.4 F (36.9 C) (Oral)   Resp 16   Ht 6' (1.829 m)   Wt (!) 163.7 kg Comment: Simultaneous filing. User may not have seen previous data.  SpO2 96%   BMI 48.95 kg/m   Gen: alert, NAD, non-toxic appearing Pupils: equal, no scleral icterus Pulm: Lungs clear to auscultation, symmetric chest rise CV: regular rate and rhythm Abd: soft, min tender, nondistended. No cellulitis. No incisional hernia Ext: no edema, no calf tenderness Skin: no rash, no jaundice  Discharge Instructions  Discharge Instructions     Ambulate hourly while awake   Complete by: As directed    Call MD for:  difficulty breathing, headache or visual disturbances   Complete by: As directed    Call MD for:  persistant dizziness or light-headedness   Complete by: As directed    Call MD for:  persistant nausea and vomiting   Complete by: As directed    Call MD for:  redness, tenderness, or signs of infection (pain, swelling, redness, odor or green/yellow discharge around incision site)   Complete by: As directed    Call MD for:  severe uncontrolled pain   Complete by: As directed    Call MD for:  temperature >101 F   Complete by: As directed    Diet bariatric full liquid  Complete by: As directed    Discharge instructions   Complete by: As directed    See bariatric discharge instructions   Incentive spirometry   Complete by: As directed    Perform hourly while awake      Allergies as of 05/29/2022   No Known Allergies      Medication List     STOP taking these medications    Semaglutide (1 MG/DOSE) 4 MG/3ML Sopn       TAKE these medications    acetaminophen 500 MG  tablet Commonly known as: TYLENOL Take 500-1,000 mg by mouth every 6 (six) hours as needed (pain.).   indapamide 1.25 MG tablet Commonly known as: LOZOL Take 1 tablet (1.25 mg total) by mouth daily. Notes to patient: Monitor Blood Pressure Daily and keep a log for primary care physician.  Monitor for symptoms of dehydration.  You may need to make changes to your medications with rapid weight loss.      lisdexamfetamine 50 MG capsule Commonly known as: Vyvanse Take 1 capsule (50 mg total) by mouth daily.   montelukast 10 MG tablet Commonly known as: SINGULAIR Take 1 tablet (10 mg total) by mouth at bedtime.   omeprazole 20 MG capsule Commonly known as: PRILOSEC Take 20 mg by mouth daily before breakfast.   ondansetron 4 MG disintegrating tablet Commonly known as: ZOFRAN-ODT Take 1 tablet (4 mg total) by mouth every 6 (six) hours as needed for nausea or vomiting.   oxyCODONE 5 MG immediate release tablet Commonly known as: Oxy IR/ROXICODONE Take 1 tablet (5 mg total) by mouth every 6 (six) hours as needed for severe pain.   sertraline 100 MG tablet Commonly known as: Zoloft Take 1 tablet (100 mg total) by mouth daily.   valsartan 320 MG tablet Commonly known as: DIOVAN Take 1 tablet (320 mg total) by mouth daily. Notes to patient: Monitor Blood Pressure Daily and keep a log for primary care physician.  You may need to make changes to your medications with rapid weight loss.      VITAMIN D-3 PO Take 1 tablet by mouth at bedtime.   Xyosted 100 MG/0.5ML Soaj Generic drug: Testosterone Enanthate Inject 1 Act into the skin once a week. Notes to patient: Medication may not be effective for the next 30 days.  Use precaution if necessary.          Follow-up Information     Frederick Adu, MD. Go on 06/27/2022.   Specialty: General Surgery Why: at 9:30am. Please arrive 15 minutes prior to your appointment time. Thank you. Contact information: [address] [place] [address] [phone]         Snyder, Frederick Slade, [place]. Go on 07/13/2022.   Specialty: General Surgery Why: at 9:15am for Dr. Andrey Campanile. Please arrive 15 minutes prior to your appointment time. Thank you. Contact information: [address] [place] [address] [phone]                  The results of significant diagnostics from this hospitalization (including imaging, microbiology, ancillary and laboratory) are listed below for reference.    Significant Diagnostic Studies: No results found.  Labs: Basic Metabolic Panel: Recent Labs  Lab 05/28/22 1219 05/29/22 0521  NA  --  137  K  --  3.6  CL  --  102  CO2  --  28  GLUCOSE  --  126*  BUN  --  10  CREATININE  1.11 1.07  CALCIUM  --  8.8*   Liver Function Tests: Recent Labs  Lab 05/29/22 0521  AST 19  ALT 23  ALKPHOS 52  BILITOT 0.7  PROT 7.1  ALBUMIN 3.7    CBC: Recent Labs  Lab 05/28/22 1219 05/29/22 0521  WBC 15.0* 11.1*  NEUTROABS  --  7.7  HGB 15.1 13.9  HCT 46.9 44.2  MCV 76.6* 78.0*  PLT 303 305    CBG: No results for input(s): "GLUCAP" in the last 168 hours.  Principal Problem:   S/P gastric bypass   Time coordinating discharge: 15 min  Signed:  Atilano Ina, MD [facility], [place] [phone] 05/29/2022, 2:07 PM

## 2022-05-29 NOTE — Progress Notes (Signed)
Patient alert and oriented, pain is controlled. Patient is tolerating fluids, advanced to protein shake today, patient is tolerating well. Reviewed Gastric Bypass discharge instructions with patient and patient is able to articulate understanding. Provided information on BELT program, Support Group and [facility] outpatient pharmacy. All questions answered, will continue to monitor.    

## 2022-05-29 NOTE — TOC Progression Note (Signed)
Transition of Care [facility]) - Progression Note    Patient Details  Name: Frederick Snyder MRN: [id number] Date of Birth: 1985-07-29  Transition of Care [facility]) CM/SW Contact  Coralyn Helling, [place] Phone Number: 05/29/2022, 9:17 AM  Clinical Narrative:    Transition of Care [facility]) Screening Note   Patient Details  Name: Frederick Snyder Date of Birth: May 15, 1986   Transition of Care [facility]) CM/SW Contact:    Coralyn Helling, LCSW Phone Number: 05/29/2022, 9:18 AM    Transition of Care Department [facility]) has reviewed patient and no TOC needs have been identified at this time. We will continue to monitor patient advancement through interdisciplinary progression rounds. If new patient transition needs arise, please place a TOC consult.   Expected Discharge Plan and Services                                                 Social Determinants of Health (SDOH) Interventions Food Insecurity Interventions: Patient Refused Housing Interventions: Patient Refused Transportation Interventions: Intervention Not Indicated Utilities Interventions: Patient Refused  Readmission Risk Interventions     No data to display

## 2022-05-31 ENCOUNTER — Telehealth: Payer: Self-pay | Admitting: Internal Medicine

## 2022-05-31 ENCOUNTER — Encounter: Payer: Self-pay | Admitting: Internal Medicine

## 2022-05-31 ENCOUNTER — Other Ambulatory Visit: Payer: Self-pay | Admitting: Behavioral Health

## 2022-05-31 ENCOUNTER — Other Ambulatory Visit: Payer: Self-pay | Admitting: Internal Medicine

## 2022-05-31 DIAGNOSIS — I10 Essential (primary) hypertension: Secondary | ICD-10-CM

## 2022-05-31 DIAGNOSIS — E230 Hypopituitarism: Secondary | ICD-10-CM

## 2022-05-31 DIAGNOSIS — F902 Attention-deficit hyperactivity disorder, combined type: Secondary | ICD-10-CM

## 2022-05-31 MED ORDER — XYOSTED 100 MG/0.5ML ~~LOC~~ SOAJ: 1.0000 | SUBCUTANEOUS | 0 refills | Status: DC

## 2022-05-31 MED ORDER — INDAPAMIDE 1.25 MG PO TABS: 1.2500 mg | ORAL_TABLET | Freq: Every day | ORAL | 0 refills | Status: DC

## 2022-05-31 MED ORDER — VALSARTAN 320 MG PO TABS: 320.0000 mg | ORAL_TABLET | Freq: Every day | ORAL | 0 refills | Status: DC

## 2022-05-31 NOTE — Telephone Encounter (Signed)
Filled 11/16 appt 2/16

## 2022-05-31 NOTE — Telephone Encounter (Signed)
MEDICATION: Testosterone Enanthate (XYOSTED) 100 MG/0.5ML SOAJ  valsartan (DIOVAN) 320 MG tablet  indapamide (LOZOL) 1.25 MG tablet   PHARMACY: [facility] in [place]  Comments: He runs out on Christmas day and will need before then because they will be closed  **Let patient know to contact pharmacy at the end of the day to make sure medication is ready. **  ** Please notify patient to allow 48-72 hours to process**  **Encourage patient to contact the pharmacy for refills or they can request refills through [facility]**

## 2022-06-01 ENCOUNTER — Telehealth (HOSPITAL_COMMUNITY): Payer: Self-pay | Admitting: *Deleted

## 2022-06-01 NOTE — Telephone Encounter (Signed)
1.  Tell me about your pain and pain management? Pt denies any pain.  2.  Let's talk about fluid intake.  How much total fluid are you taking in? Pt states that he is getting in at least 80oz of fluid including protein shakes and bottled water.  3.  How much protein have you taken in the last 2 days? Pt states he is meeting his goal of 80g of protein each day with the protein shakes.  4.  Have you had nausea?  Tell me about when have experienced nausea and what you did to help? Pt denies nausea.   5.  Has the frequency or color changed with your urine? Pt states that he is urinating "fine" with no changes in frequency or urgency.     6.  Tell me what your incisions look like? "Incisions look fine". Pt denies a fever, chills.  Pt states incisions are not swollen, open, or draining.  Pt encouraged to call [facility] if incisions change.   7.  Have you been passing gas? BM? Pt states that he is having BMs. Last BM 06/01/22.     8.  If a problem or question were to arise who would you call?  Do you know contact numbers for [facility], [facility], and [facility]? Pt denies dehydration symptoms.  Pt can describe s/sx of dehydration.  Pt knows to call [facility] for surgical, [facility] for nutrition, and [facility] for non-urgent questions or concerns.   9.  How has the walking going? Pt states he is walking around and able to be active without difficulty.   10. Are you still using your incentive spirometer?  If so, how often? Pt states that he is not doing the I.S.   Pt encouraged to use incentive spirometer, at least 10x every hour while awake until he sees the surgeon. Pt states that he is being intentional and taking intermittent deep breaths. Pt states that he is using his CPAP every night.  11.  How are your vitamins and calcium going?  How are you taking them? Pt states that he is taking his supplements and vitamins without difficulty.  Reminded patient that the first 30 days post-operatively are important for successful  recovery.  Practice good hand hygiene, wearing a mask when appropriate (since optional in most places), and minimizing exposure to people who live outside of the home, especially if they are exhibiting any respiratory, GI, or illness-like symptoms.

## 2022-06-12 ENCOUNTER — Encounter: Payer: Self-pay | Admitting: Dietician

## 2022-06-12 ENCOUNTER — Encounter: Payer: 59 | Attending: General Surgery | Admitting: Dietician

## 2022-06-12 VITALS — Ht 72.0 in | Wt 341.3 lb

## 2022-06-12 DIAGNOSIS — E669 Obesity, unspecified: Secondary | ICD-10-CM | POA: Insufficient documentation

## 2022-06-12 NOTE — Progress Notes (Signed)
2 Week Post-Operative Nutrition Class   Patient was seen on 06/12/2022 for Post-Operative Nutrition education at the [facility].    Surgery date: 05/28/2022 Surgery type: RYGB Anthropometrics  Start weight at [facility]: 384.2 lbs (date: 12/11/2021)  Height: 72 in Weight today: 341.3 BMI: 46.29 kg/m2     Clinical  Medical hx: Asthma, hyperlipidemia, HTN, Sleep Apnea Medications: Valsartan, Vyvanse, Indapamide, Xyosted, Sertraline, Singular, Ozempic  Labs: Vit D 17.73 (7 months ago) Notable signs/symptoms: none noted Any previous deficiencies? No Bowel Habits: Every day to every other day no complaints   The following the learning objectives were met by the patient during this course: Identifies Phase 3 (Soft, High Proteins) Dietary Goals and will begin from 2 weeks post-operatively to 2 months post-operatively Identifies appropriate sources of fluids and proteins  Identifies appropriate fat sources and healthy verses unhealthy fat types   States protein recommendations and appropriate sources post-operatively Identifies the need for appropriate texture modifications, mastication, and bite sizes when consuming solids Identifies appropriate fat consumption and sources Identifies appropriate multivitamin and calcium sources post-operatively Describes the need for physical activity post-operatively and will follow MD recommendations States when to call healthcare provider regarding medication questions or post-operative complications   Handouts given during class include: Phase 3A: Soft, High Protein Diet Handout Phase 3 High Protein Meals Healthy Fats   Follow-Up Plan: Patient will follow-up at [facility] in 6 weeks for 2 month post-op nutrition visit for diet advancement per MD.

## 2022-06-18 ENCOUNTER — Telehealth: Payer: Self-pay | Admitting: Dietician

## 2022-06-18 NOTE — Telephone Encounter (Signed)
RD called pt to verify fluid intake once starting soft, solid proteins 2 week post-bariatric surgery.   Daily Fluid intake: 64 oz Daily Protein intake: 80 grams Bowel Habits: no issues   Concerns/issues: no concerns; asked about tomato sauce, dietitian advised to limit like a condiment (1 tsp to 1 TBSP)

## 2022-07-02 ENCOUNTER — Ambulatory Visit (INDEPENDENT_AMBULATORY_CARE_PROVIDER_SITE_OTHER): Payer: 59 | Admitting: Internal Medicine

## 2022-07-02 ENCOUNTER — Encounter: Payer: Self-pay | Admitting: Internal Medicine

## 2022-07-02 ENCOUNTER — Other Ambulatory Visit: Payer: Self-pay | Admitting: Behavioral Health

## 2022-07-02 VITALS — BP 118/76 | HR 68 | Temp 98.2°F | Resp 16 | Ht 72.0 in | Wt 329.0 lb

## 2022-07-02 DIAGNOSIS — I10 Essential (primary) hypertension: Secondary | ICD-10-CM

## 2022-07-02 DIAGNOSIS — F902 Attention-deficit hyperactivity disorder, combined type: Secondary | ICD-10-CM

## 2022-07-02 NOTE — Progress Notes (Unsigned)
   Subjective:  Patient ID: Frederick Snyder, male    DOB: 11-19-1985  Age: 36 y.o. MRN: [id number]  CC: No chief complaint on file.   HPI Frederick Snyder presents for ***  Outpatient Medications Prior to Visit  Medication Sig Dispense Refill   Cholecalciferol (VITAMIN D-3 PO) Take 1 tablet by mouth at bedtime.     montelukast (SINGULAIR) 10 MG tablet Take 1 tablet (10 mg total) by mouth at bedtime. 90 tablet 1   omeprazole (PRILOSEC) 20 MG capsule Take 20 mg by mouth daily before breakfast.     sertraline (ZOLOFT) 100 MG tablet Take 1 tablet (100 mg total) by mouth daily. 90 tablet 1   Testosterone Enanthate (XYOSTED) 100 MG/0.5ML SOAJ Inject 1 Act into the skin once a week. 6 mL 0   valsartan (DIOVAN) 320 MG tablet Take 1 tablet (320 mg total) by mouth daily. 90 tablet 0   VYVANSE 50 MG capsule Take 1 capsule by mouth daily. 30 capsule 0   acetaminophen (TYLENOL) 500 MG tablet Take 500-1,000 mg by mouth every 6 (six) hours as needed (pain.).     indapamide (LOZOL) 1.25 MG tablet Take 1 tablet (1.25 mg total) by mouth daily. 90 tablet 0   ondansetron (ZOFRAN-ODT) 4 MG disintegrating tablet Dissolve 1 tablet (4 mg total) by mouth every 6 (six) hours as needed for nausea or vomiting. 20 tablet 0   oxyCODONE (OXY IR/ROXICODONE) 5 MG immediate release tablet Take 1 tablet (5 mg total) by mouth every 6 (six) hours as needed for severe pain. 10 tablet 0   No facility-administered medications prior to visit.    ROS Review of Systems  Objective:  BP 118/76 (BP Location: Right Arm, Patient Position: Sitting, Cuff Size: Large)   Pulse 68   Temp 98.2 F (36.8 C) (Oral)   Resp 16   Ht 6' (1.829 m)   Wt (!) 329 lb (149.2 kg)   SpO2 95%   BMI 44.62 kg/m   BP Readings from Last 3 Encounters:  07/02/22 118/76  05/29/22 116/66  05/22/22 (!) 149/89    Wt Readings from Last 3 Encounters:  07/02/22 (!) 329 lb (149.2 kg)  06/12/22 (!) 341 lb 4.8 oz (154.8 kg)  05/28/22 (!) 360  lb 14.3 oz (163.7 kg)    Physical Exam  Lab Results  Component Value Date   WBC 11.1 (H) 05/29/2022   HGB 13.9 05/29/2022   HCT 44.2 05/29/2022   PLT 305 05/29/2022   GLUCOSE 126 (H) 05/29/2022   CHOL 191 03/01/2022   TRIG 193.0 (H) 03/01/2022   HDL 23.40 (L) 03/01/2022   LDLDIRECT 114.0 09/07/2020   LDLCALC 129 (H) 03/01/2022   ALT 23 05/29/2022   AST 19 05/29/2022   NA 137 05/29/2022   K 3.6 05/29/2022   CL 102 05/29/2022   CREATININE 1.07 05/29/2022   BUN 10 05/29/2022   CO2 28 05/29/2022   TSH 1.51 03/01/2022   HGBA1C 5.8 (H) 03/01/2022    No results found.  Assessment & Plan:   Diagnoses and all orders for this visit:  Primary hypertension   I have discontinued Frederick Snyder. Frederick Snyder's acetaminophen, ondansetron, oxyCODONE, and indapamide. I am also having him maintain his montelukast, sertraline, Cholecalciferol (VITAMIN D-3 PO), omeprazole, Vyvanse, Xyosted, and valsartan.  No orders of the defined types were placed in this encounter.    Follow-up: Return in about 6 months (around 12/31/2022).  Scarlette Calico, MD

## 2022-07-02 NOTE — Patient Instructions (Signed)
Hypertension, Adult High blood pressure (hypertension) is when the force of blood pumping through the arteries is too strong. The arteries are the blood vessels that carry blood from the heart throughout the body. Hypertension forces the heart to work harder to pump blood and may cause arteries to become narrow or stiff. Untreated or uncontrolled hypertension can lead to a heart attack, heart failure, a stroke, kidney disease, and other problems. A blood pressure reading consists of a higher number over a lower number. Ideally, your blood pressure should be below 120/80. The first ("top") number is called the systolic pressure. It is a measure of the pressure in your arteries as your heart beats. The second ("bottom") number is called the diastolic pressure. It is a measure of the pressure in your arteries as the heart relaxes. What are the causes? The exact cause of this condition is not known. There are some conditions that result in high blood pressure. What increases the risk? Certain factors may make you more likely to develop high blood pressure. Some of these risk factors are under your control, including: Smoking. Not getting enough exercise or physical activity. Being overweight. Having too much fat, sugar, calories, or salt (sodium) in your diet. Drinking too much alcohol. Other risk factors include: Having a personal history of heart disease, diabetes, high cholesterol, or kidney disease. Stress. Having a family history of high blood pressure and high cholesterol. Having obstructive sleep apnea. Age. The risk increases with age. What are the signs or symptoms? High blood pressure may not cause symptoms. Very high blood pressure (hypertensive crisis) may cause: Headache. Fast or irregular heartbeats (palpitations). Shortness of breath. Nosebleed. Nausea and vomiting. Vision changes. Severe chest pain, dizziness, and seizures. How is this diagnosed? This condition is diagnosed by  measuring your blood pressure while you are seated, with your arm resting on a flat surface, your legs uncrossed, and your feet flat on the floor. The cuff of the blood pressure monitor will be placed directly against the skin of your upper arm at the level of your heart. Blood pressure should be measured at least twice using the same arm. Certain conditions can cause a difference in blood pressure between your right and left arms. If you have a high blood pressure reading during one visit or you have normal blood pressure with other risk factors, you may be asked to: Return on a different day to have your blood pressure checked again. Monitor your blood pressure at home for 1 week or longer. If you are diagnosed with hypertension, you may have other blood or imaging tests to help your health care provider understand your overall risk for other conditions. How is this treated? This condition is treated by making healthy lifestyle changes, such as eating healthy foods, exercising more, and reducing your alcohol intake. You may be referred for counseling on a healthy diet and physical activity. Your health care provider may prescribe medicine if lifestyle changes are not enough to get your blood pressure under control and if: Your systolic blood pressure is above 130. Your diastolic blood pressure is above 80. Your personal target blood pressure may vary depending on your medical conditions, your age, and other factors. Follow these instructions at home: Eating and drinking  Eat a diet that is high in fiber and potassium, and low in sodium, added sugar, and fat. An example of this eating plan is called the DASH diet. DASH stands for Dietary Approaches to Stop Hypertension. To eat this way: Eat   plenty of fresh fruits and vegetables. Try to fill one half of your plate at each meal with fruits and vegetables. Eat whole grains, such as whole-wheat pasta, brown rice, or whole-grain bread. Fill about one  fourth of your plate with whole grains. Eat or drink low-fat dairy products, such as skim milk or low-fat yogurt. Avoid fatty cuts of meat, processed or cured meats, and poultry with skin. Fill about one fourth of your plate with lean proteins, such as fish, chicken without skin, beans, eggs, or tofu. Avoid pre-made and processed foods. These tend to be higher in sodium, added sugar, and fat. Reduce your daily sodium intake. Many people with hypertension should eat less than 1,500 mg of sodium a day. Do not drink alcohol if: Your health care provider tells you not to drink. You are pregnant, may be pregnant, or are planning to become pregnant. If you drink alcohol: Limit how much you have to: 0-1 drink a day for women. 0-2 drinks a day for men. Know how much alcohol is in your drink. In the [place] one drink equals one 12 oz bottle of beer (355 mL), one 5 oz glass of wine (148 mL), or one 1 oz glass of hard liquor (44 mL). Lifestyle  Work with your health care provider to maintain a healthy body weight or to lose weight. Ask what an ideal weight is for you. Get at least 30 minutes of exercise that causes your heart to beat faster (aerobic exercise) most days of the week. Activities may include walking, swimming, or biking. Include exercise to strengthen your muscles (resistance exercise), such as Pilates or lifting weights, as part of your weekly exercise routine. Try to do these types of exercises for 30 minutes at least 3 days a week. Do not use any products that contain nicotine or tobacco. These products include cigarettes, chewing tobacco, and vaping devices, such as e-cigarettes. If you need help quitting, ask your health care provider. Monitor your blood pressure at home as told by your health care provider. Keep all follow-up visits. This is important. Medicines Take over-the-counter and prescription medicines only as told by your health care provider. Follow directions carefully. Blood  pressure medicines must be taken as prescribed. Do not skip doses of blood pressure medicine. Doing this puts you at risk for problems and can make the medicine less effective. Ask your health care provider about side effects or reactions to medicines that you should watch for. Contact a health care provider if you: Think you are having a reaction to a medicine you are taking. Have headaches that keep coming back (recurring). Feel dizzy. Have swelling in your ankles. Have trouble with your vision. Get help right away if you: Develop a severe headache or confusion. Have unusual weakness or numbness. Feel faint. Have severe pain in your chest or abdomen. Vomit repeatedly. Have trouble breathing. These symptoms may be an emergency. Get help right away. Call 911. Do not wait to see if the symptoms will go away. Do not drive yourself to the hospital. Summary Hypertension is when the force of blood pumping through your arteries is too strong. If this condition is not controlled, it may put you at risk for serious complications. Your personal target blood pressure may vary depending on your medical conditions, your age, and other factors. For most people, a normal blood pressure is less than 120/80. Hypertension is treated with lifestyle changes, medicines, or a combination of both. Lifestyle changes include losing weight, eating a healthy,   low-sodium diet, exercising more, and limiting alcohol. This information is not intended to replace advice given to you by your health care provider. Make sure you discuss any questions you have with your health care provider. Document Revised: 04/04/2021 Document Reviewed: 04/04/2021 Elsevier Patient Education  2023 [employer]vier Inc.  

## 2022-07-02 NOTE — Progress Notes (Signed)
PATIENT: Frederick Snyder DOB: [birth year]  REASON FOR VISIT: follow up HISTORY FROM: patient PRIMARY NEUROLOGIST: Dr. Frances Furbish  Chief Complaint  Patient presents with   Follow-up    Pt in 20 Pt here for CPAP f/u Pt states no questions or concerns for todays visit      HISTORY OF PRESENT ILLNESS: Today 07/03/22:  Frederick Snyder is a 37 y.o. male with a history of OSA on CPAP. Returns today for follow-up.  Reports that the CPAP is working well.  He denies any new issues.  He returns today for evaluation.       HISTORY   REVIEW OF SYSTEMS: Out of a complete 14 system review of symptoms, the patient complains only of the following symptoms, and all other reviewed systems are negative.  FSS 10 ESS 0  ALLERGIES: No Known Allergies  HOME MEDICATIONS: Outpatient Medications Prior to Visit  Medication Sig Dispense Refill   montelukast (SINGULAIR) 10 MG tablet Take 1 tablet (10 mg total) by mouth at bedtime. 90 tablet 1   sertraline (ZOLOFT) 100 MG tablet Take 1 tablet (100 mg total) by mouth daily. 90 tablet 1   Testosterone Enanthate (XYOSTED) 100 MG/0.5ML SOAJ Inject 1 Act into the skin once a week. 6 mL 0   valsartan (DIOVAN) 320 MG tablet Take 1 tablet (320 mg total) by mouth daily. 90 tablet 0   VYVANSE 50 MG capsule Take 1 capsule by mouth daily. 30 capsule 0   Cholecalciferol (VITAMIN D-3 PO) Take 1 tablet by mouth at bedtime.     omeprazole (PRILOSEC) 20 MG capsule Take 20 mg by mouth daily before breakfast.     No facility-administered medications prior to visit.    PAST MEDICAL HISTORY: Past Medical History:  Diagnosis Date   Anemia    Anxiety    Asthma    Depression    GERD (gastroesophageal reflux disease)    History of kidney stones    Hyperlipidemia    diet controlled - no meds   Hypertension    Migraine    OSA on CPAP 05/03/2021   PONV (postoperative nausea and vomiting)    Vitamin D deficiency     PAST SURGICAL HISTORY: Past  Surgical History:  Procedure Laterality Date   GASTRIC ROUX-EN-Y N/A 05/28/2022   Procedure: LAPAROSCOPIC ROUX-EN-Y GASTRIC BYPASS WITH UPPER ENDOSCOPY;  Surgeon: [name], MD;  Location: [facility];  Service: General;  Laterality: N/A;   INGUINAL HERNIA REPAIR Left 12/08/2020   Procedure: OPEN HERNIA REPAIR INGUINAL ADULT;  Surgeon: Abigail Miyamoto, MD;  [facility];  Service: General;  Laterality: Left;  TAP BLOCK AND LMA.   INSERTION OF MESH Left 12/08/2020   Procedure: INSERTION OF MESH;  Surgeon: Abigail Miyamoto, MD;  [facility];  Service: General;  Laterality: Left;   KNEE SURGERY     tubes in ears     UPPER GI ENDOSCOPY N/A 05/28/2022   Procedure: UPPER GI ENDOSCOPY;  Surgeon: [name], MD;  Location: [facility];  Service: General;  Laterality: N/A;   WISDOM TOOTH EXTRACTION      FAMILY HISTORY: Family History  Problem Relation Age of Onset   Diabetes Mother    Heart disease Mother    Sleep apnea Mother    Diabetes Father    Heart disease Father    Hypertension Father    Sleep apnea Father    Diabetes Other    Hyperlipidemia Other    Hypertension Other  Other Neg Hx        hypogonadism    SOCIAL HISTORY: Social History   Socioeconomic History   Marital status: Married    Spouse name: Not on file   Number of children: Not on file   Years of education: Not on file   Highest education level: Not on file  Occupational History   Occupation: Selling car parts.   Tobacco Use   Smoking status: Never   Smokeless tobacco: Never   Tobacco comments:    Quit in approx 2007  Vaping Use   Vaping Use: Never used  Substance and Sexual Activity   Alcohol use: No   Drug use: No   Sexual activity: Yes    Partners: Female  Other Topics Concern   Not on file  Social History Narrative   Lives at home with wife and child   Social Determinants of Health   Financial Resource Strain: Not on file  Food Insecurity: Unknown (05/28/2022)   Hunger Vital Sign     Worried About Running Out of Food in the Last Year: Patient refused    [place] in the Last Year: Patient refused  Transportation Needs: No Transportation Needs (05/28/2022)   PRAPARE - Hydrologist (Medical): No    Lack of Transportation (Non-Medical): No  Physical Activity: Not on file  Stress: Not on file  Social Connections: Not on file  Intimate Partner Violence: Unknown (05/28/2022)   Humiliation, Afraid, Rape, and Kick questionnaire    Fear of Current or Ex-Partner: Patient refused    Emotionally Abused: Patient refused    Physically Abused: Patient refused    Sexually Abused: Patient refused      PHYSICAL EXAM  Vitals:   07/03/22 1023  BP: 106/64  Pulse: 60  Weight: (!) 330 lb (149.7 kg)  Height: 6' (1.829 m)   Body mass index is 44.76 kg/m.  Generalized: Well developed, in no acute distress  Chest: Lungs clear to auscultation bilaterally  Neurological examination  Mentation: Alert oriented to time, place, history taking. Follows all commands speech and language fluent Cranial nerve II-XII: Extraocular movements were full, visual field were full on confrontational test Head turning and shoulder shrug  were normal and symmetric. Gait and station: Gait is normal.    DIAGNOSTIC DATA (LABS, IMAGING, TESTING) - I reviewed patient records, labs, notes, testing and imaging myself where available.  Lab Results  Component Value Date   WBC 11.1 (H) 05/29/2022   HGB 13.9 05/29/2022   HCT 44.2 05/29/2022   MCV 78.0 (L) 05/29/2022   PLT 305 05/29/2022      Component Value Date/Time   NA 137 05/29/2022 0521   K 3.6 05/29/2022 0521   CL 102 05/29/2022 0521   CO2 28 05/29/2022 0521   GLUCOSE 126 (H) 05/29/2022 0521   BUN 10 05/29/2022 0521   CREATININE 1.07 05/29/2022 0521   CALCIUM 8.8 (L) 05/29/2022 0521   PROT 7.1 05/29/2022 0521   ALBUMIN 3.7 05/29/2022 0521   AST 19 05/29/2022 0521   ALT 23 05/29/2022 0521   ALKPHOS 52  05/29/2022 0521   BILITOT 0.7 05/29/2022 0521   GFRNONAA >60 05/29/2022 0521   Lab Results  Component Value Date   CHOL 191 03/01/2022   HDL 23.40 (L) 03/01/2022   LDLCALC 129 (H) 03/01/2022   LDLDIRECT 114.0 09/07/2020   TRIG 193.0 (H) 03/01/2022   CHOLHDL 8 03/01/2022   Lab Results  Component Value Date  HGBA1C 5.8 (H) 03/01/2022   No results found for: "VITAMINB12" Lab Results  Component Value Date   TSH 1.51 03/01/2022      ASSESSMENT AND PLAN 37 y.o. year old male  has a past medical history of Anemia, Anxiety, Asthma, Depression, GERD (gastroesophageal reflux disease), History of kidney stones, Hyperlipidemia, Hypertension, Migraine, OSA on CPAP (05/03/2021), PONV (postoperative nausea and vomiting), and Vitamin D deficiency. here with:  OSA on CPAP  - CPAP compliance excellent - Good treatment of AHI  - Encourage patient to use CPAP nightly and > 4 hours each night - F/U in 1 year or sooner if needed   Ward Givens, MSN, NP-C 07/03/2022, 10:43 AM [facility] [address] [place], [place] [address] [phone]

## 2022-07-03 ENCOUNTER — Ambulatory Visit (INDEPENDENT_AMBULATORY_CARE_PROVIDER_SITE_OTHER): Payer: Managed Care, Other (non HMO) | Admitting: Adult Health

## 2022-07-03 ENCOUNTER — Encounter: Payer: Self-pay | Admitting: Adult Health

## 2022-07-03 VITALS — BP 106/64 | HR 60 | Ht 72.0 in | Wt 330.0 lb

## 2022-07-03 DIAGNOSIS — G4733 Obstructive sleep apnea (adult) (pediatric): Secondary | ICD-10-CM

## 2022-07-03 NOTE — Patient Instructions (Signed)
Continue using CPAP nightly and greater than 4 hours each night °If your symptoms worsen or you develop new symptoms please let us know.  ° °

## 2022-07-04 ENCOUNTER — Telehealth: Payer: Self-pay

## 2022-07-04 NOTE — Telephone Encounter (Signed)
Prior Authorization Vyvanse 50 mg  #30 [facility]

## 2022-07-24 ENCOUNTER — Encounter: Payer: 59 | Attending: General Surgery | Admitting: Dietician

## 2022-07-24 ENCOUNTER — Encounter: Payer: Self-pay | Admitting: Dietician

## 2022-07-24 ENCOUNTER — Ambulatory Visit: Payer: 59 | Admitting: Internal Medicine

## 2022-07-24 VITALS — Ht 72.0 in | Wt 322.7 lb

## 2022-07-24 DIAGNOSIS — E669 Obesity, unspecified: Secondary | ICD-10-CM | POA: Diagnosis present

## 2022-07-24 NOTE — Progress Notes (Signed)
Bariatric Nutrition Follow-Up Visit Medical Nutrition Therapy  Appt Start Time: 8:10   End Time: 8:34  Surgery date: 05/28/2022 Surgery type: RYGB  Anthropometrics  Start weight at [facility]: 384.2 lbs (date: 12/11/2021)  Height: 72 in Weight today: 322.7 lb  BMI: 43.77 kg/m2     Clinical  Medical hx: Asthma, hyperlipidemia, HTN, Sleep Apnea Medications: Valsartan, Vyvanse, Xyosted, Sertraline, Singular Labs: Vit D 17.73 (7 months ago) Notable signs/symptoms: none noted Any previous deficiencies? No Bowel Habits: Every day to every other day no complaints  Body Composition Scale 07/24/2022  Weight  lbs 322.7  Total Body Fat  % 36.8     Visceral Fat 27  Fat-Free Mass  % 63.1     Total Body Water  % 44.1     Muscle-Mass  lbs 60.9  BMI 43.8  Body Fat Displacement ---        Torso  lbs 73.7        Left Leg  lbs 14.7        Right Leg  lbs 14.7        Left Arm  lbs 7.3        Right Arm  lbs 7.3     Lifestyle & Dietary Hx  Pt states he is not taking Prilosec, indapamide and  Ozempic any more. Pt states he has not had any negatives, stating unless he eats too fast. Pt is doing great.  Estimated daily fluid intake: 64 oz Estimated daily protein intake: 80 g Supplements: multivitamin and calcium Current average weekly physical activity: walking 3 days a week, 20-30 mintes   24-Hr Dietary Recall First Meal: sausage Snack: peanuts or cashews  Second Meal: chicken or BBQ or steak Snack: peanuts or cashews  Third Meal: chicken or BBQ or steak Snack:  Beverages: Gatorade zero, water, coffee  Post-Op Goals/ Signs/ Symptoms Using straws: no Drinking while eating: no Chewing/swallowing difficulties: no Changes in vision: no Changes to mood/headaches: no Hair loss/changes to skin/nails: no Difficulty focusing/concentrating: no Sweating: no Limb weakness: no Dizziness/lightheadedness: no Palpitations: no  Carbonated/caffeinated beverages: no N/V/D/C/Gas: no Abdominal  pain: no Dumping syndrome: no    NUTRITION DIAGNOSIS  Overweight/obesity ([place]-3.3) related to past poor dietary habits and physical inactivity as evidenced by completed bariatric surgery and following dietary guidelines for continued weight loss and healthy nutrition status.     NUTRITION INTERVENTION Nutrition counseling (C-1) and education (E-2) to facilitate bariatric surgery goals, including: Diet advancement to the next phase now including all vegetables and fruits The importance of consuming adequate calories as well as certain nutrients daily due to the body's need for essential vitamins, minerals, and fats The importance of daily physical activity and to reach a goal of at least 150 minutes of moderate to vigorous physical activity weekly (or as directed by their physician) due to benefits such as increased musculature and improved lab values The importance of intuitive eating specifically learning hunger-satiety cues and understanding the importance of learning a new body: The importance of mindful eating to avoid grazing behaviors   Handouts Provided Include  Vegetables and fruits lists Bariatric MyPlate  Learning Style & Readiness for Change Teaching method utilized: Visual & Auditory  Demonstrated degree of understanding via: Teach Back  Readiness Level: Action Barriers to learning/adherence to lifestyle change: nothing identified  RD's Notes for Next Visit Assess adherence to pt chosen goals  MONITORING & EVALUATION Dietary intake, weekly physical activity, body weight.  Next Steps Patient is to follow-up in 4 months  for 6 month post-op follow-up/class.

## 2022-07-25 ENCOUNTER — Other Ambulatory Visit (HOSPITAL_COMMUNITY): Payer: Self-pay

## 2022-07-27 ENCOUNTER — Ambulatory Visit (INDEPENDENT_AMBULATORY_CARE_PROVIDER_SITE_OTHER): Payer: Self-pay | Admitting: Behavioral Health

## 2022-07-27 DIAGNOSIS — F489 Nonpsychotic mental disorder, unspecified: Secondary | ICD-10-CM

## 2022-07-27 NOTE — Progress Notes (Signed)
Pt did not show for scheduled appt and did not notify office as required within 24 hours. Additional fees to be assessed.

## 2022-09-20 ENCOUNTER — Ambulatory Visit (INDEPENDENT_AMBULATORY_CARE_PROVIDER_SITE_OTHER): Payer: 59 | Admitting: Orthopaedic Surgery

## 2022-09-20 DIAGNOSIS — M7712 Lateral epicondylitis, left elbow: Secondary | ICD-10-CM

## 2022-09-20 MED ORDER — LIDOCAINE HCL 1 % IJ SOLN
1.0000 mL | INTRAMUSCULAR | Status: AC | PRN
  Administered 2022-09-20: 1 mL

## 2022-09-20 MED ORDER — METHYLPREDNISOLONE ACETATE 40 MG/ML IJ SUSP
40.0000 mg | INTRAMUSCULAR | Status: AC | PRN
  Administered 2022-09-20: 40 mg via INTRA_ARTICULAR

## 2022-09-20 MED ORDER — BUPIVACAINE HCL 0.5 % IJ SOLN
1.0000 mL | INTRAMUSCULAR | Status: AC | PRN
  Administered 2022-09-20: 1 mL via INTRA_ARTICULAR

## 2022-09-20 NOTE — Progress Notes (Signed)
Office Visit Note   Patient: Frederick Snyder           Date of Birth: 07-Oct-1985           MRN: [id number] Visit Date: 09/20/2022              Requested by: Frederick Grandchild, MD [address] [place],  [place] [address] PCP: Frederick Grandchild, MD   Assessment & Plan: Visit Diagnoses:  1. Lateral epicondylitis of left elbow     Plan: Impression is recurrent left elbow lateral epicondylitis.  Today we discussed various treatment options to include repeat cortisone injection versus surgical intervention.  Based on the patient's previous response to cortisone injection I believe it is reasonable to try another injection today.  I have discussed that we can do up to 3 injections as long as they are significantly helping.  He is agreeable to this plan.  I have provided him with a new sheet on extensor tendon exercises.  He will wear his tennis elbow strap as needed.  Follow-up with [place] as needed.  Follow-Up Instructions: Return if symptoms worsen or fail to improve.   Orders:  No orders of the defined types were placed in this encounter.  No orders of the defined types were placed in this encounter.     Procedures: Medium Joint Inj: L lateral epicondyle on 09/20/2022 10:35 AM Indications: pain Details: 25 G needle Medications: 1 mL lidocaine 1 %; 1 mL bupivacaine 0.5 %; 40 mg methylPREDNISolone acetate 40 MG/ML Outcome: tolerated well, no immediate complications Patient was prepped and draped in the usual sterile fashion.       Clinical Data: No additional findings.   Subjective: Chief Complaint  Patient presents with   Left Elbow - Pain    HPI patient is a pleasant 37 year old left-hand-dominant gentleman who comes in today with recurrent left lateral elbow pain.  History of tennis elbow.  He saw [name] for this back in November of this past year where he had had symptoms for approximately 6 months prior to his visit.  His tennis elbow was injected with  cortisone.  This significantly helped until about 2 to 3 weeks ago.  No new injury or change in activity.  Prior to the return of pain, he had been working on tennis elbow exercises and occasionally wearing a strap.  The pain he is now having is to the lateral elbow just over the epicondyle.  He notes a constant dull ache with increased pain when he is gripping anything.  He takes an occasional Tylenol without significant relief.  He is unable to take NSAIDs due to history of gastric bypass.  Review of Systems as detailed in HPI.  All others reviewed and are negative.   Objective: Vital Signs: There were no vitals taken for this visit.  Physical Exam well-developed well-nourished gentleman in no acute distress.  Alert and oriented x 3.  Ortho Exam left elbow exam reveals full range of motion without pain.  He has increased pain with gripping my hand as well as with resisted long finger extension.  Moderate tenderness along the lateral epicondyle.  No tenderness along the radial tunnel.  He is neurovascularly intact distally.  Specialty Comments:  No specialty comments available.  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   Pure hypertriglyceridemia 05/03/2021   OSA  on CPAP 05/03/2021   Prediabetes 05/03/2021   Primary hypertension 09/07/2020   Morbid obesity with BMI of 50.0-59.9, adult 09/07/2020   Loud snoring 09/07/2020   Encounter for general adult medical examination with abnormal findings 09/07/2020   Vitamin D deficiency disease 09/07/2020   OCD (obsessive compulsive disorder) 04/06/2018   Attention deficit hyperactivity disorder (ADHD) 04/06/2018   Hyperprolactinemia 02/26/2018   Asthma 05/25/2009   Past Medical History:  Diagnosis Date   Anemia    Anxiety    Asthma    Depression    GERD (gastroesophageal reflux disease)    History of  kidney stones    Hyperlipidemia    diet controlled - no meds   Hypertension    Migraine    OSA on CPAP 05/03/2021   PONV (postoperative nausea and vomiting)    Vitamin D deficiency     Family History  Problem Relation Age of Onset   Diabetes Mother    Heart disease Mother    Sleep apnea Mother    Diabetes Father    Heart disease Father    Hypertension Father    Sleep apnea Father    Diabetes Other    Hyperlipidemia Other    Hypertension Other    Other Neg Hx        hypogonadism    Past Surgical History:  Procedure Laterality Date   GASTRIC ROUX-EN-Y N/A 05/28/2022   Procedure: LAPAROSCOPIC ROUX-EN-Y GASTRIC BYPASS WITH UPPER ENDOSCOPY;  Surgeon: [name], MD;  Location: [facility];  Service: General;  Laterality: N/A;   INGUINAL HERNIA REPAIR Left 12/08/2020   Procedure: OPEN HERNIA REPAIR INGUINAL ADULT;  Surgeon: Abigail Miyamoto, MD;  Location: [facility];  Service: General;  Laterality: Left;  TAP BLOCK AND LMA.   INSERTION OF MESH Left 12/08/2020   Procedure: INSERTION OF MESH;  Surgeon: Abigail Miyamoto, MD;  Location: [facility];  Service: General;  Laterality: Left;   KNEE SURGERY     tubes in ears     UPPER GI ENDOSCOPY N/A 05/28/2022   Procedure: UPPER GI ENDOSCOPY;  Surgeon: [name], MD;  Location: [facility];  Service: General;  Laterality: N/A;   WISDOM TOOTH EXTRACTION     Social History   Occupational History   Occupation: Firefighter parts.   Tobacco Use   Smoking status: Never   Smokeless tobacco: Never   Tobacco comments:    Quit in approx 2007  Vaping Use   Vaping Use: Never used  Substance and Sexual Activity   Alcohol use: No   Drug use: No   Sexual activity: Yes    Partners: Female

## 2022-09-22 ENCOUNTER — Other Ambulatory Visit: Payer: Self-pay | Admitting: Internal Medicine

## 2022-09-22 DIAGNOSIS — I10 Essential (primary) hypertension: Secondary | ICD-10-CM

## 2022-09-24 ENCOUNTER — Other Ambulatory Visit (HOSPITAL_COMMUNITY): Payer: Self-pay

## 2022-09-24 ENCOUNTER — Other Ambulatory Visit: Payer: Self-pay

## 2022-09-24 MED ORDER — VALSARTAN 320 MG PO TABS
320.0000 mg | ORAL_TABLET | Freq: Every day | ORAL | 0 refills | Status: DC
  Filled 2022-09-24 (×2): qty 30, 30d supply, fill #0
  Filled 2022-09-24: qty 90, 90d supply, fill #0
  Filled 2022-11-05: qty 30, 30d supply, fill #1
  Filled 2022-12-03: qty 30, 30d supply, fill #2

## 2022-11-06 ENCOUNTER — Other Ambulatory Visit: Payer: Self-pay

## 2022-11-14 ENCOUNTER — Other Ambulatory Visit: Payer: Self-pay | Admitting: Behavioral Health

## 2022-11-14 DIAGNOSIS — F411 Generalized anxiety disorder: Secondary | ICD-10-CM

## 2022-11-14 DIAGNOSIS — F331 Major depressive disorder, recurrent, moderate: Secondary | ICD-10-CM

## 2022-11-20 ENCOUNTER — Encounter: Payer: 59 | Attending: General Surgery | Admitting: Skilled Nursing Facility1

## 2022-11-20 VITALS — Wt 316.3 lb

## 2022-11-20 DIAGNOSIS — Z6841 Body Mass Index (BMI) 40.0 and over, adult: Secondary | ICD-10-CM | POA: Diagnosis present

## 2022-11-27 ENCOUNTER — Encounter: Payer: Self-pay | Admitting: Skilled Nursing Facility1

## 2022-11-27 NOTE — Progress Notes (Addendum)
Follow-up visit:  Post-Operative 11/20/2022 Surgery  Medical Nutrition Therapy:  Appt start time: 6:00pm end time:  7:00pm  Primary concerns today: Post-operative Bariatric Surgery Nutrition Management 6 Month Post-Op Class   Surgery date: 05/28/2022 Surgery type: RYGB  Anthropometrics  Start weight at [facility]: 384.2 lbs (date: 12/11/2021)  Height: 72 in Weight today: 316.3   Clinical  Medical hx: Asthma, hyperlipidemia, HTN, Sleep Apnea Medications: Valsartan, Vyvanse, Xyosted, Sertraline, Singular Labs: Vit D 17.73 (7 months ago) Notable signs/symptoms: none noted Any previous deficiencies? No Bowel Habits: Every day to every other day no complaints  Body Composition Scale 07/24/2022 11/20/2022  Weight  lbs 322.7 316.3  Total Body Fat  % 36.8 36.2     Visceral Fat 27 27  Fat-Free Mass  % 63.1 63.7     Total Body Water  % 44.1 44.7     Muscle-Mass  lbs 60.9 59.7  BMI 43.8 43  Body Fat Displacement ---         Torso  lbs 73.7 71.3        Left Leg  lbs 14.7 14.2        Right Leg  lbs 14.7 14.2        Left Arm  lbs 7.3 7.1        Right Arm  lbs 7.3 7.1     Information Reviewed/ Discussed During Appointment: -Review of composition scale numbers -Fluid requirements (64-100 ounces) -Protein requirements (60-80g) -Strategies for tolerating diet -Advancement of diet to include Starchy vegetables -Barriers to inclusion of new foods -Inclusion of appropriate multivitamin and calcium supplements  -Exercise recommendations   Fluid intake: adequate   Medications: See List Supplementation: appropriate    Using straws: no Drinking while eating: no Having you been chewing well: yes Chewing/swallowing difficulties: no Changes in vision: no Changes to mood/headaches: no Hair loss/Cahnges to skin/Changes to nails: no Any difficulty focusing or concentrating: no Sweating: no Dizziness/Lightheaded: no Palpitations: no  Carbonated beverages: no N/V/D/C/GAS: no Abdominal  Pain: no Dumping syndrome: no  Recent physical activity:  ADL's  Progress Towards Goal(s):  In Progress Teaching method utilized: Visual & Auditory  Demonstrated degree of understanding via: Teach Back  Readiness Level: Action Barriers to learning/adherence to lifestyle change: none identified  Handouts given during visit include: Phase V diet Progression  Goals Sheet The Benefits of Exercise are endless..... Support Group Topics  Teaching Method Utilized:  Visual Auditory Hands on  Demonstrated degree of understanding via:  Teach Back   Monitoring/Evaluation:  Dietary intake, exercise, and body weight. Follow up in 3 months for 9 month post-op visit.

## 2022-12-03 ENCOUNTER — Other Ambulatory Visit (HOSPITAL_COMMUNITY): Payer: Self-pay

## 2022-12-10 NOTE — Progress Notes (Unsigned)
Office Visit Note   Patient: Frederick Snyder           Date of Birth: [birth year]           MRN: [id number] Visit Date: 12/11/2022              Requested by: Etta Grandchild, MD [address] [place],  [place] [address] PCP: Etta Grandchild, MD   Assessment & Plan: Visit Diagnoses:  1. Lateral epicondylitis of left elbow     Plan: Frederick Snyder is a 37 year old gentleman with a recurrent left lateral epicondylitis of the elbow.  Cortisone injection provided relief for about 3 months.  He has been doing home exercises in the meantime.  Based on temporary relief we will move forward with an MRI of the left elbow to rule out structural abnormalities.  Follow-up after the MRI.  Follow-Up Instructions: No follow-ups on file.   Orders:  No orders of the defined types were placed in this encounter.  No orders of the defined types were placed in this encounter.     Procedures: No procedures performed   Clinical Data: No additional findings.   Subjective: No chief complaint on file.   HPI Frederick Snyder returns today for follow-up for left elbow lateral epicondylitis. Review of Systems   Objective: Vital Signs: There were no vitals taken for this visit.  Physical Exam  Ortho Exam Exam is unchanged. Specialty Comments:  No specialty comments available.  Imaging: No results found.   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[facility]) 08/30/2021   Pure hypertriglyceridemia 05/03/2021   OSA on CPAP 05/03/2021   Prediabetes 05/03/2021   Primary hypertension 09/07/2020   Morbid obesity with BMI of 50.0-59.9, adult (HCC) 09/07/2020   Loud snoring 09/07/2020   Encounter for general adult medical examination with abnormal findings 09/07/2020   Vitamin D deficiency disease 09/07/2020   OCD (obsessive compulsive disorder) 04/06/2018   Attention deficit hyperactivity disorder  (ADHD) 04/06/2018   Hyperprolactinemia (HCC) 02/26/2018   Asthma 05/25/2009   Past Medical History:  Diagnosis Date   Anemia    Anxiety    Asthma    Depression    GERD (gastroesophageal reflux disease)    History of kidney stones    Hyperlipidemia    diet controlled - no meds   Hypertension    Migraine    OSA on CPAP 05/03/2021   PONV (postoperative nausea and vomiting)    Vitamin D deficiency     Family History  Problem Relation Age of Onset   Diabetes Mother    Heart disease Mother    Sleep apnea Mother    Diabetes Father    Heart disease Father    Hypertension Father    Sleep apnea Father    Diabetes Other    Hyperlipidemia Other    Hypertension Other    Other Neg Hx        hypogonadism    Past Surgical History:  Procedure Laterality Date   GASTRIC ROUX-EN-Y N/A 05/28/2022   Procedure: LAPAROSCOPIC ROUX-EN-Y GASTRIC BYPASS WITH UPPER ENDOSCOPY;  Surgeon: [name], MD;  Location: [facility];  Service: General;  Laterality: N/A;   INGUINAL HERNIA REPAIR Left 12/08/2020   Procedure: OPEN HERNIA REPAIR INGUINAL ADULT;  Surgeon: Abigail Miyamoto, MD;  Location: [facility];  Service: General;  Laterality: Left;  TAP BLOCK AND LMA.   INSERTION OF MESH Left  12/08/2020   Procedure: INSERTION OF MESH;  Surgeon: Abigail Miyamoto, MD;  Location: [facility] OR;  Service: General;  Laterality: Left;   KNEE SURGERY     tubes in ears     UPPER GI ENDOSCOPY N/A 05/28/2022   Procedure: UPPER GI ENDOSCOPY;  Surgeon: [name], MD;  Location: [facility];  Service: General;  Laterality: N/A;   WISDOM TOOTH EXTRACTION     Social History   Occupational History   Occupation: Firefighter parts.   Tobacco Use   Smoking status: Never   Smokeless tobacco: Never   Tobacco comments:    Quit in approx 2007  Vaping Use   Vaping Use: Never used  Substance and Sexual Activity   Alcohol use: No   Drug use: No   Sexual activity: Yes    Partners: Female

## 2022-12-11 ENCOUNTER — Telehealth: Payer: Self-pay | Admitting: Behavioral Health

## 2022-12-11 ENCOUNTER — Encounter: Payer: Self-pay | Admitting: Orthopaedic Surgery

## 2022-12-11 ENCOUNTER — Ambulatory Visit (INDEPENDENT_AMBULATORY_CARE_PROVIDER_SITE_OTHER): Payer: 59 | Admitting: Orthopaedic Surgery

## 2022-12-11 DIAGNOSIS — F411 Generalized anxiety disorder: Secondary | ICD-10-CM

## 2022-12-11 DIAGNOSIS — F331 Major depressive disorder, recurrent, moderate: Secondary | ICD-10-CM

## 2022-12-11 DIAGNOSIS — M7712 Lateral epicondylitis, left elbow: Secondary | ICD-10-CM | POA: Diagnosis not present

## 2022-12-11 MED ORDER — SERTRALINE HCL 100 MG PO TABS: 100.0000 mg | ORAL_TABLET | Freq: Every day | ORAL | 0 refills | Status: DC

## 2022-12-11 NOTE — Telephone Encounter (Signed)
Patient's wife called in for refill on Setraline 100mg . Ph: [phone] Appt 7/9 Pharmacy [facility] [address] [place]

## 2022-12-11 NOTE — Telephone Encounter (Signed)
Sent 30 day supply

## 2022-12-18 ENCOUNTER — Encounter: Payer: Self-pay | Admitting: Behavioral Health

## 2022-12-18 ENCOUNTER — Ambulatory Visit (INDEPENDENT_AMBULATORY_CARE_PROVIDER_SITE_OTHER): Payer: 59 | Admitting: Behavioral Health

## 2022-12-18 ENCOUNTER — Other Ambulatory Visit (HOSPITAL_COMMUNITY): Payer: Self-pay

## 2022-12-18 DIAGNOSIS — F411 Generalized anxiety disorder: Secondary | ICD-10-CM | POA: Diagnosis not present

## 2022-12-18 DIAGNOSIS — F331 Major depressive disorder, recurrent, moderate: Secondary | ICD-10-CM

## 2022-12-18 DIAGNOSIS — F902 Attention-deficit hyperactivity disorder, combined type: Secondary | ICD-10-CM

## 2022-12-18 MED ORDER — AMPHETAMINE-DEXTROAMPHETAMINE 20 MG PO TABS
20.0000 mg | ORAL_TABLET | Freq: Two times a day (BID) | ORAL | 0 refills | Status: DC
Start: 2022-12-18 — End: 2023-02-01
  Filled 2022-12-18: qty 60, 30d supply, fill #0

## 2022-12-18 MED ORDER — SERTRALINE HCL 100 MG PO TABS
100.0000 mg | ORAL_TABLET | Freq: Every day | ORAL | 1 refills | Status: DC
Start: 2022-12-18 — End: 2023-05-30
  Filled 2022-12-18: qty 30, 30d supply, fill #0
  Filled 2023-01-16: qty 30, 30d supply, fill #1
  Filled 2023-02-15 – 2023-02-23 (×2): qty 30, 30d supply, fill #2
  Filled 2023-03-27: qty 30, 30d supply, fill #3
  Filled 2023-05-16: qty 30, 30d supply, fill #4

## 2022-12-18 NOTE — Progress Notes (Signed)
[facility]  Patient ID: Frederick Snyder,  MRN: [id number]  PCP: Etta Grandchild, MD  Date of Evaluation: 12/18/2022 Time spent:30 minutes  Chief Complaint:  Chief Complaint   ADHD; Anxiety; Depression; Follow-up; Medication Refill; Medication Problem; Patient Education     HISTORY/CURRENT STATUS: HPI 37 year old male presents to this office for follow up and medication management. Had gastric bypass and now has lost 80 lbs. Feeling much better about his progress. Anxiety and depression has improved.  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 Says insurance will not pay for his Vyvanse and he needs another alternative. Since bypass he cannot take ER versions.  Says his anxiety is 2/10 today but has upcoming gastro bypass consultation he is worried about. Depression is 2/10. Says he is sleeping 7-8 hours per night. Denies mania, no psychosis, no SI/HI.   Previous psychiatric medications: Ritalin Vyvanse Adderall  Zoloft  Individual Medical History/ Review of Systems: Changes? :No   Allergies: Patient has no known allergies.  Current Medications:  Current Outpatient Medications:    amphetamine-dextroamphetamine (ADDERALL) 20 MG tablet, Take 1 tablet (20 mg total) by mouth 2 (two) times daily., Disp: 60 tablet, Rfl: 0   montelukast (SINGULAIR) 10 MG tablet, Take 1 tablet (10 mg total) by mouth at bedtime., Disp: 90 tablet, Rfl: 1   sertraline (ZOLOFT) 100 MG tablet, Take 1 tablet (100 mg total) by mouth daily., Disp: 90 tablet, Rfl: 1   Testosterone Enanthate (XYOSTED) 100 MG/0.5ML SOAJ, Inject 1 Act into the skin once a week., Disp: 6 mL, Rfl: 0   valsartan (DIOVAN) 320 MG tablet, Take 1 tablet (320 mg total) by mouth daily., Disp: 90 tablet, Rfl: 0   VYVANSE 50 MG capsule, Take 1 capsule by mouth daily., Disp: 30 capsule, Rfl: 0 Medication Side Effects: none  Family Medical/ Social History: Changes?  No  MENTAL HEALTH EXAM:  There were no vitals taken for this visit.There is no height or weight on file to calculate BMI.  General Appearance: Casual, Neat, and Well Groomed  Eye Contact:  Good  Speech:  Clear and Coherent  Volume:  Normal  Mood:  NA  Affect:  Appropriate  Thought Process:  Coherent  Orientation:  Full (Time, Place, and Person)  Thought Content: Logical   Suicidal Thoughts:  No  Homicidal Thoughts:  No  Memory:  WNL  Judgement:  Good  Insight:  Good  Psychomotor Activity:  Normal  Concentration:  Concentration: Good  Recall:  Good  Fund of Knowledge: Good  Language: Good  Assets:  Desire for Improvement  ADL's:  Intact  Cognition: WNL  Prognosis:  Good    DIAGNOSES:    ICD-10-CM   1. Attention deficit hyperactivity disorder (ADHD), combined type  F90.2 amphetamine-dextroamphetamine (ADDERALL) 20 MG tablet    2. Major depressive disorder, recurrent episode, moderate (HCC)  F33.1 sertraline (ZOLOFT) 100 MG tablet    3. Generalized anxiety disorder  F41.1 sertraline (ZOLOFT) 100 MG tablet      Receiving Psychotherapy: No    RECOMMENDATIONS:   Greater than 50% of 30  min face to face time with patient was spent on counseling and coordination of care. Discussed his current stability with Zoloft. Requesting no changes this visit but needing to try stimulant that is formulary. Cannot use ER version due to Gastric bypass.   Will start Adderall 20 mg IR twice daily To continue  Zoloft  to 100 mg daily.  To follow up in 2 months to reassess Will monitor BP and HR regularly and log to provide to PCP Will report side effects or worsening symptoms and seek medical attention immediately if BP rises above 180.  Discussed potential benefits, risks, and side effects of stimulants with patient to include increased heart rate, palpitations, insomnia, increased anxiety, increased irritability, or decreased appetite.  Instructed patient to contact office if experiencing  any significant tolerability issues.  Reviewed PDMP      Joan Flores, NP

## 2022-12-31 ENCOUNTER — Ambulatory Visit: Payer: 59 | Admitting: Internal Medicine

## 2023-01-02 ENCOUNTER — Ambulatory Visit
Admission: RE | Admit: 2023-01-02 | Discharge: 2023-01-02 | Disposition: A | Discharge status: Home / Self Care | Payer: 59 | Source: Ambulatory Visit | Attending: Orthopaedic Surgery | Admitting: Orthopaedic Surgery

## 2023-01-02 DIAGNOSIS — M7712 Lateral epicondylitis, left elbow: Secondary | ICD-10-CM

## 2023-01-04 ENCOUNTER — Other Ambulatory Visit: Payer: Self-pay

## 2023-01-04 ENCOUNTER — Emergency Department (HOSPITAL_COMMUNITY)
Admission: EM | Admit: 2023-01-04 | Discharge: 2023-01-05 | Disposition: A | Discharge status: Home / Self Care | Payer: 59 | Attending: Emergency Medicine | Admitting: Emergency Medicine

## 2023-01-04 DIAGNOSIS — I10 Essential (primary) hypertension: Secondary | ICD-10-CM | POA: Insufficient documentation

## 2023-01-04 DIAGNOSIS — R1012 Left upper quadrant pain: Secondary | ICD-10-CM | POA: Diagnosis not present

## 2023-01-04 DIAGNOSIS — Z79899 Other long term (current) drug therapy: Secondary | ICD-10-CM | POA: Diagnosis not present

## 2023-01-04 DIAGNOSIS — J45909 Unspecified asthma, uncomplicated: Secondary | ICD-10-CM | POA: Insufficient documentation

## 2023-01-04 NOTE — ED Triage Notes (Signed)
Pt reports left side abdominal pain for the last few days. Pt reports pain increased some tonight and felt like a "different pain." Pt reports hx of gastric bypass surgery in December. Pt reports dizziness as well.

## 2023-01-05 ENCOUNTER — Emergency Department (HOSPITAL_COMMUNITY): Payer: 59

## 2023-01-05 LAB — URINALYSIS, ROUTINE W REFLEX MICROSCOPIC
Bilirubin Urine: NEGATIVE
Glucose, UA: NEGATIVE mg/dL
Hgb urine dipstick: NEGATIVE
Ketones, ur: NEGATIVE mg/dL
Leukocytes,Ua: NEGATIVE
Nitrite: NEGATIVE
Protein, ur: NEGATIVE mg/dL
Specific Gravity, Urine: 1.021 (ref 1.005–1.030)
pH: 6 (ref 5.0–8.0)

## 2023-01-05 LAB — COMPREHENSIVE METABOLIC PANEL
ALT: 24 U/L (ref 0–44)
AST: 17 U/L (ref 15–41)
Albumin: 4.4 g/dL (ref 3.5–5.0)
Alkaline Phosphatase: 100 U/L (ref 38–126)
Anion gap: 13 (ref 5–15)
BUN: 11 mg/dL (ref 6–20)
CO2: 23 mmol/L (ref 22–32)
Calcium: 9.2 mg/dL (ref 8.9–10.3)
Chloride: 101 mmol/L (ref 98–111)
Creatinine, Ser: 0.83 mg/dL (ref 0.61–1.24)
GFR, Estimated: >60 mL/min (ref >60)
Glucose, Bld: 101 mg/dL — ABNORMAL HIGH (ref 70–99)
Potassium: 3.4 mmol/L — ABNORMAL LOW (ref 3.5–5.1)
Sodium: 137 mmol/L (ref 135–145)
Total Bilirubin: 0.9 mg/dL (ref 0.3–1.2)
Total Protein: 7.7 g/dL (ref 6.5–8.1)

## 2023-01-05 LAB — CBC
HCT: 43.3 % (ref 39.0–52.0)
Hemoglobin: 14.8 g/dL (ref 13.0–17.0)
MCH: 28.8 pg (ref 26.0–34.0)
MCHC: 34.2 g/dL (ref 30.0–36.0)
MCV: 84.4 fL (ref 80.0–100.0)
Platelets: 263 10*3/uL (ref 150–400)
RBC: 5.13 MIL/uL (ref 4.22–5.81)
RDW: 13.7 % (ref 11.5–15.5)
WBC: 8.3 10*3/uL (ref 4.0–10.5)
nRBC: 0 % (ref 0.0–0.2)

## 2023-01-05 LAB — LIPASE, BLOOD: Lipase: 32 U/L (ref 11–51)

## 2023-01-05 MED ORDER — IOHEXOL 300 MG/ML  SOLN
100.0000 mL | Freq: Once | INTRAMUSCULAR | Status: AC | PRN
  Administered 2023-01-05: 100 mL via INTRAVENOUS

## 2023-01-05 MED ORDER — SODIUM CHLORIDE (PF) 0.9 % IJ SOLN
INTRAMUSCULAR | Status: AC
  Filled 2023-01-05: qty 50

## 2023-01-05 NOTE — ED Provider Notes (Signed)
Frederick Snyder EMERGENCY DEPARTMENT AT [facility] Provider Note   CSN: [id number] Arrival date & time: 01/04/23  2323     History  Chief Complaint  Patient presents with   Abdominal Pain    Frederick Snyder is a 37 y.o. male.  The history is provided by the patient and medical records.  Abdominal Pain  14 old male with history of asthma, ADHD, obesity, OCD, hypertension, sleep apnea, presenting to the ED with abdominal pain.  States for the last few days he has had a lot of pain in his left upper abdomen, seems to come in waves and almost feels like "spasms".  Occasionally feels bloated but has not had any nausea or vomiting.  He is still having normal bowel movements.  He has not had any difficulty with urination.  He is status post gastric bypass in December with Dr. Andrey Campanile, procedure went well without any noted complications.  No chest pain or SOB.  Home Medications Prior to Admission medications   Medication Sig Start Date End Date Taking? Authorizing Provider  amphetamine-dextroamphetamine (ADDERALL) 20 MG tablet Take 1 tablet (20 mg total) by mouth 2 (two) times daily. 12/18/22 01/18/23  Joan Flores, NP  montelukast (SINGULAIR) 10 MG tablet Take 1 tablet (10 mg total) by mouth at bedtime. 03/01/22   Etta Grandchild, MD  sertraline (ZOLOFT) 100 MG tablet Take 1 tablet (100 mg total) by mouth daily. 12/18/22   Joan Flores, NP  Testosterone Enanthate (XYOSTED) 100 MG/0.5ML SOAJ Inject 1 Act into the skin once a week. 05/31/22   Etta Grandchild, MD  valsartan (DIOVAN) 320 MG tablet Take 1 tablet (320 mg total) by mouth daily. 09/24/22   Etta Grandchild, MD  VYVANSE 50 MG capsule Take 1 capsule by mouth daily. 07/03/22   Joan Flores, NP      Allergies    Patient has no known allergies.    Review of Systems   Review of Systems  Gastrointestinal:  Positive for abdominal pain.  All other systems reviewed and are negative.   Physical Exam Updated Vital Signs BP  (!) 136/92   Pulse (!) 59   Temp 98.5 F (36.9 C) (Oral)   Resp 17   Ht 6' (1.829 m)   Wt (!) 139.7 kg   SpO2 97%   BMI 41.77 kg/m   Physical Exam Vitals and nursing note reviewed.  Constitutional:      Appearance: He is well-developed.  HENT:     Head: Normocephalic and atraumatic.  Eyes:     Conjunctiva/sclera: Conjunctivae normal.     Pupils: Pupils are equal, round, and reactive to light.  Cardiovascular:     Rate and Rhythm: Normal rate and regular rhythm.     Heart sounds: Normal heart sounds.  Pulmonary:     Effort: Pulmonary effort is normal.     Breath sounds: Normal breath sounds.  Abdominal:     General: Bowel sounds are normal.     Palpations: Abdomen is soft.     Tenderness: There is abdominal tenderness in the left upper quadrant.     Comments: Very mild tenderness LUQ, no rebound or guarding  Musculoskeletal:        General: Normal range of motion.     Cervical back: Normal range of motion.  Skin:    General: Skin is warm and dry.  Neurological:     Mental Status: He is alert and oriented to person, place, and time.  ED Results / Procedures / Treatments   Labs (all labs ordered are listed, but only abnormal results are displayed) Labs Reviewed  COMPREHENSIVE METABOLIC PANEL - Abnormal; Notable for the following components:      Result Value   Potassium 3.4 (*)    Glucose, Bld 101 (*)    All other components within normal limits  LIPASE, BLOOD  CBC  URINALYSIS, ROUTINE W REFLEX MICROSCOPIC    EKG EKG Interpretation Date/Time:  Saturday January 05 2023 00:15:45 EDT Ventricular Rate:  71 PR Interval:  158 QRS Duration:  106 QT Interval:  381 QTC Calculation: 414 R Axis:   34  Text Interpretation: Sinus rhythm Low voltage, precordial leads Nonspecific T abnormalities, lateral leads Minimal ST elevation, inferior leads When compared with ECG of 11/13/2021, Nonspecific T wave abnormality is now present Confirmed by Dione Booze ([id number]) on  01/05/2023 12:23:49 AM  Radiology CT ABDOMEN PELVIS W CONTRAST  Result Date: 01/05/2023 CLINICAL DATA:  Left upper quadrant abdominal pain. History of gastric bypass surgery in December. EXAM: CT ABDOMEN AND PELVIS WITH CONTRAST TECHNIQUE: Multidetector CT imaging of the abdomen and pelvis was performed using the standard protocol following bolus administration of intravenous contrast. RADIATION DOSE REDUCTION: This exam was performed according to the departmental dose-optimization program which includes automated exposure control, adjustment of the mA and/or kV according to patient size and/or use of iterative reconstruction technique. CONTRAST:  OMNIPAQUE IOHEXOL 300 MG/ML  SOLN COMPARISON:  CT abdomen and pelvis 06/29/2010 FINDINGS: Lower chest: No acute abnormality. Hepatobiliary: Hepatic steatosis. Normal gallbladder. No biliary dilation. Pancreas: Unremarkable. Spleen: Unremarkable. Adrenals/Urinary Tract: Normal adrenal glands. No urinary calculi or hydronephrosis. Bladder is unremarkable. Stomach/Bowel: Normal caliber large and small bowel. No bowel wall thickening. The appendix is normal. Postoperative change of gastric bypass. Vascular/Lymphatic: No significant vascular findings are present. No enlarged abdominal or pelvic lymph nodes. Reproductive: Unremarkable. Other: No free intraperitoneal fluid or air. Musculoskeletal: No acute fracture. IMPRESSION: 1. No acute abnormality in the abdomen or pelvis. 2. Hepatic steatosis. 3. Postoperative change of gastric bypass. Electronically Signed   By: Minerva Fester M.D.   On: 01/05/2023 03:27    Procedures Procedures    Medications Ordered in ED Medications - No data to display  ED Course/ Medical Decision Making/ A&P                             Medical Decision Making Amount and/or Complexity of Data Reviewed Labs: ordered. Radiology: ordered and independent interpretation performed. ECG/medicine tests: ordered and independent  interpretation performed.  Risk Prescription drug management.   37 year old male here with left upper abdominal pain over the past few days, worse today.  Reports "spasm" like pain and some intermittent bloating.  He has not had any nausea or vomiting, normal bowel movements.  He is afebrile and nontoxic.  He has minimal tenderness to left upper quadrant without rebound or guarding.  His labs are overall reassuring without leukocytosis or electrolyte derangement.  Normal lipase.  He is status post gastric bypass in December.  CT was obtained, no acute findings, specifically no findings of surgical complication.  Patient was reassured.  He will monitor his symptoms and follow-up with his surgeon, Dr. Andrey Campanile, if symptoms persist.  Can return here for any new/acute changes.  Final Clinical Impression(s) / ED Diagnoses Final diagnoses:  LUQ pain    Rx / DC Orders ED Discharge Orders     None  Garlon Hatchet, PA-C 01/05/23 0440    Dione Booze, MD 01/05/23 [id number]

## 2023-01-05 NOTE — Discharge Instructions (Signed)
CT scan today without acute findings. Monitor symptoms.  If they persist or are worsening please check in with Dr. Andrey Campanile about this. Return here for any new/acute changes-- fever, vomiting, etc.

## 2023-01-11 ENCOUNTER — Encounter: Payer: Self-pay | Admitting: Orthopaedic Surgery

## 2023-01-11 ENCOUNTER — Ambulatory Visit: Payer: 59 | Admitting: Orthopaedic Surgery

## 2023-01-11 DIAGNOSIS — M7712 Lateral epicondylitis, left elbow: Secondary | ICD-10-CM | POA: Diagnosis not present

## 2023-01-11 MED ORDER — BUPIVACAINE HCL 0.5 % IJ SOLN
1.0000 mL | INTRAMUSCULAR | Status: AC | PRN
Start: 2023-01-11 — End: 2023-01-11
  Administered 2023-01-11: 1 mL via INTRA_ARTICULAR

## 2023-01-11 MED ORDER — METHYLPREDNISOLONE ACETATE 40 MG/ML IJ SUSP
40.0000 mg | INTRAMUSCULAR | Status: AC | PRN
Start: 2023-01-11 — End: 2023-01-11
  Administered 2023-01-11: 40 mg via INTRA_ARTICULAR

## 2023-01-11 MED ORDER — LIDOCAINE HCL 1 % IJ SOLN
1.0000 mL | INTRAMUSCULAR | Status: AC | PRN
Start: 2023-01-11 — End: 2023-01-11
  Administered 2023-01-11: 1 mL

## 2023-01-11 NOTE — Progress Notes (Signed)
Office Visit Note   Patient: Frederick Snyder           Date of Birth: [birth year]           MRN: [id number] Visit Date: 01/11/2023              Requested by: Frederick Grandchild, MD [address] [place],  [place] [address] PCP: Frederick Grandchild, MD   Assessment & Plan: Visit Diagnoses:  1. Lateral epicondylitis of left elbow     Plan: MRI of the left elbow shows mild tendinosis without any significant tears.  Based on these findings he would like to try a third cortisone injection.  He will continue to modify activity as needed and use counterforce brace.  We discussed that once this injection wears off we may need to consider surgery.  Follow-Up Instructions: No follow-ups on file.   Orders:  No orders of the defined types were placed in this encounter.  No orders of the defined types were placed in this encounter.     Procedures: Medium Joint Inj: L lateral epicondyle on 01/11/2023 11:29 AM Details: 22 G needle Medications: 1 mL lidocaine 1 %; 40 mg methylPREDNISolone acetate 40 MG/ML; 1 mL bupivacaine 0.5 % Outcome: tolerated well, no immediate complications      Clinical Data: No additional findings.   Subjective: Chief Complaint  Patient presents with   Left Elbow - Follow-up    MRI review    HPI Frederick Snyder returns today to discuss the left elbow MRI scan for tennis elbow.  He has had 2 cortisone injections so far. Review of Systems   Objective: Vital Signs: There were no vitals taken for this visit.  Physical Exam  Ortho Exam Exam nation of left elbow is unchanged. Specialty Comments:  No specialty comments available.  Imaging: No results found.   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[facility]) 08/30/2021   Pure hypertriglyceridemia 05/03/2021   OSA on CPAP 05/03/2021   Prediabetes 05/03/2021   Primary hypertension 09/07/2020   Morbid obesity  with BMI of 50.0-59.9, adult (HCC) 09/07/2020   Loud snoring 09/07/2020   Encounter for general adult medical examination with abnormal findings 09/07/2020   Vitamin D deficiency disease 09/07/2020   OCD (obsessive compulsive disorder) 04/06/2018   Attention deficit hyperactivity disorder (ADHD) 04/06/2018   Hyperprolactinemia (HCC) 02/26/2018   Asthma 05/25/2009   Past Medical History:  Diagnosis Date   Anemia    Anxiety    Asthma    Depression    GERD (gastroesophageal reflux disease)    History of kidney stones    Hyperlipidemia    diet controlled - no meds   Hypertension    Migraine    OSA on CPAP 05/03/2021   PONV (postoperative nausea and vomiting)    Vitamin D deficiency     Family History  Problem Relation Age of Onset   Diabetes Mother    Heart disease Mother    Sleep apnea Mother    Diabetes Father    Heart disease Father    Hypertension Father    Sleep apnea Father    Diabetes Other    Hyperlipidemia Other    Hypertension Other    Other Neg Hx        hypogonadism    Past Surgical History:  Procedure Laterality Date   GASTRIC ROUX-EN-Y N/A 05/28/2022   Procedure: LAPAROSCOPIC ROUX-EN-Y  GASTRIC BYPASS WITH UPPER ENDOSCOPY;  Surgeon: [name], MD;  Location: [facility] ORS;  Service: General;  Laterality: N/A;   INGUINAL HERNIA REPAIR Left 12/08/2020   Procedure: OPEN HERNIA REPAIR INGUINAL ADULT;  Surgeon: Abigail Miyamoto, MD;  Location: [facility] OR;  Service: General;  Laterality: Left;  TAP BLOCK AND LMA.   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   Tobacco Use    Smoking status: Never   Smokeless tobacco: Never   Tobacco comments:    Quit in approx 2007  Vaping Use   Vaping status: Never Used  Substance and Sexual Activity   Alcohol use: No   Drug use: No   Sexual activity: Yes    Partners: Female

## 2023-01-16 ENCOUNTER — Other Ambulatory Visit: Payer: Self-pay

## 2023-01-16 ENCOUNTER — Other Ambulatory Visit: Payer: Self-pay | Admitting: Internal Medicine

## 2023-01-16 ENCOUNTER — Other Ambulatory Visit (HOSPITAL_COMMUNITY): Payer: Self-pay

## 2023-01-16 DIAGNOSIS — I10 Essential (primary) hypertension: Secondary | ICD-10-CM

## 2023-01-16 MED ORDER — VALSARTAN 320 MG PO TABS
320.0000 mg | ORAL_TABLET | Freq: Every day | ORAL | 0 refills | Status: DC
Start: 2023-01-16 — End: 2023-05-16
  Filled 2023-01-16: qty 30, 30d supply, fill #0
  Filled 2023-02-10 – 2023-02-23 (×2): qty 30, 30d supply, fill #1
  Filled 2023-03-27: qty 30, 30d supply, fill #2

## 2023-02-01 ENCOUNTER — Other Ambulatory Visit: Payer: Self-pay

## 2023-02-01 ENCOUNTER — Other Ambulatory Visit: Payer: Self-pay | Admitting: Behavioral Health

## 2023-02-01 ENCOUNTER — Other Ambulatory Visit (HOSPITAL_COMMUNITY): Payer: Self-pay

## 2023-02-01 DIAGNOSIS — F902 Attention-deficit hyperactivity disorder, combined type: Secondary | ICD-10-CM

## 2023-02-01 MED ORDER — AMPHETAMINE-DEXTROAMPHETAMINE 20 MG PO TABS
20.0000 mg | ORAL_TABLET | Freq: Two times a day (BID) | ORAL | 0 refills | Status: DC
Start: 2023-02-01 — End: 2023-07-02
  Filled 2023-02-01: qty 60, 30d supply, fill #0

## 2023-02-10 ENCOUNTER — Other Ambulatory Visit (HOSPITAL_COMMUNITY): Payer: Self-pay

## 2023-02-12 ENCOUNTER — Other Ambulatory Visit: Payer: Self-pay

## 2023-02-15 ENCOUNTER — Other Ambulatory Visit: Payer: Self-pay

## 2023-02-15 ENCOUNTER — Encounter: Payer: Self-pay | Admitting: Pharmacist

## 2023-02-15 ENCOUNTER — Other Ambulatory Visit (HOSPITAL_COMMUNITY): Payer: Self-pay

## 2023-02-20 ENCOUNTER — Other Ambulatory Visit: Payer: Self-pay

## 2023-02-21 ENCOUNTER — Ambulatory Visit: Payer: 59 | Admitting: Dietician

## 2023-02-22 ENCOUNTER — Other Ambulatory Visit (HOSPITAL_COMMUNITY): Payer: Self-pay

## 2023-02-23 ENCOUNTER — Other Ambulatory Visit: Payer: Self-pay | Admitting: Behavioral Health

## 2023-02-23 ENCOUNTER — Other Ambulatory Visit (HOSPITAL_COMMUNITY): Payer: Self-pay

## 2023-02-23 DIAGNOSIS — F902 Attention-deficit hyperactivity disorder, combined type: Secondary | ICD-10-CM

## 2023-02-24 NOTE — Telephone Encounter (Signed)
Due 9/24

## 2023-02-25 ENCOUNTER — Other Ambulatory Visit: Payer: Self-pay

## 2023-02-28 ENCOUNTER — Other Ambulatory Visit: Payer: Self-pay

## 2023-02-28 ENCOUNTER — Other Ambulatory Visit (HOSPITAL_BASED_OUTPATIENT_CLINIC_OR_DEPARTMENT_OTHER): Payer: Self-pay

## 2023-02-28 ENCOUNTER — Ambulatory Visit (INDEPENDENT_AMBULATORY_CARE_PROVIDER_SITE_OTHER): Payer: 59 | Admitting: Behavioral Health

## 2023-02-28 ENCOUNTER — Encounter: Payer: Self-pay | Admitting: Behavioral Health

## 2023-02-28 DIAGNOSIS — F902 Attention-deficit hyperactivity disorder, combined type: Secondary | ICD-10-CM | POA: Diagnosis not present

## 2023-02-28 MED ORDER — AMPHETAMINE-DEXTROAMPHETAMINE 30 MG PO TABS
30.0000 mg | ORAL_TABLET | Freq: Two times a day (BID) | ORAL | 0 refills | Status: DC
Start: 2023-02-28 — End: 2023-04-19
  Filled 2023-02-28: qty 60, 30d supply, fill #0

## 2023-02-28 NOTE — Progress Notes (Signed)
[facility]  Patient ID: Frederick Snyder,  MRN: [id number]  PCP: Etta Grandchild, MD  Date of Evaluation: 02/28/2023 Time spent:30 minutes  Chief Complaint:  Chief Complaint   ADHD; Depression; Anxiety; Follow-up; Medication Problem; Medication Refill; Patient Education     HISTORY/CURRENT STATUS: HPI 37 year old male presents to this office for follow up and medication management. Had gastric bypass and now has lost 100 lbs now. Feeling much better about his progress. Anxiety and depression has improved.  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  Since bypass he cannot take ER versions.  Says his anxiety is 2/10 today but has upcoming gastro bypass consultation he is worried about. Depression is 2/10. Says he is sleeping 7-8 hours per night. Denies mania, no psychosis, no SI/HI.   Previous psychiatric medications: Ritalin Vyvanse Adderall  Zoloft      Individual Medical History/ Review of Systems: Changes? :No   Allergies: Patient has no known allergies.  Current Medications:  Current Outpatient Medications:    amphetamine-dextroamphetamine (ADDERALL) 30 MG tablet, Take 1 tablet by mouth 2 (two) times daily., Disp: 60 tablet, Rfl: 0   amphetamine-dextroamphetamine (ADDERALL) 20 MG tablet, Take 1 tablet (20 mg total) by mouth 2 (two) times daily., Disp: 60 tablet, Rfl: 0   CALCIUM PO, Take 1 tablet by mouth daily., Disp: , Rfl:    Multiple Vitamin (MULTIVITAMIN) tablet, Take 1 tablet by mouth daily., Disp: , Rfl:    sertraline (ZOLOFT) 100 MG tablet, Take 1 tablet (100 mg total) by mouth daily., Disp: 90 tablet, Rfl: 1   valsartan (DIOVAN) 320 MG tablet, Take 1 tablet (320 mg total) by mouth daily., Disp: 90 tablet, Rfl: 0 Medication Side Effects: none  Family Medical/ Social History: Changes? No  MENTAL HEALTH EXAM:  There were no vitals taken for this visit.There is no height or weight on file to calculate BMI.  General  Appearance: Casual, Neat, and Well Groomed  Eye Contact:  Good  Speech:  Clear and Coherent  Volume:  Normal  Mood:  NA  Affect:  Appropriate  Thought Process:  Coherent  Orientation:  Full (Time, Place, and Person)  Thought Content: Logical   Suicidal Thoughts:  No  Homicidal Thoughts:  No  Memory:  WNL  Judgement:  Good  Insight:  Good  Psychomotor Activity:  Normal  Concentration:  Concentration: Good  Recall:  Good  Fund of Knowledge: Good  Language: Good  Assets:  Desire for Improvement  ADL's:  Intact  Cognition: WNL  Prognosis:  Good    DIAGNOSES:    ICD-10-CM   1. Attention deficit hyperactivity disorder (ADHD), combined type  F90.2 amphetamine-dextroamphetamine (ADDERALL) 30 MG tablet      Receiving Psychotherapy: No    RECOMMENDATIONS:   Greater than 50% of 30  min face to face time with patient was spent on counseling and coordination of care. Discussed his current stability with Zoloft.  Feels like his Adderall dose is not as effective and requesting dose increase. Attention and focus has been getting worse at work.  Cannot use ER version due to Gastric bypass.   Will increase Adderall to 30 mg IR twice daily To continue  Zoloft to 100 mg daily.  To follow up in 3 months to reassess Will monitor BP and HR regularly and log to provide to PCP Will report side effects or worsening symptoms and seek medical attention immediately if BP rises above 180.  Discussed  potential benefits, risks, and side effects of stimulants with patient to include increased heart rate, palpitations, insomnia, increased anxiety, increased irritability, or decreased appetite.  Instructed patient to contact office if experiencing any significant tolerability issues.  Reviewed PDMP      Joan Flores, NP

## 2023-03-02 ENCOUNTER — Other Ambulatory Visit (HOSPITAL_COMMUNITY): Payer: Self-pay

## 2023-03-27 ENCOUNTER — Other Ambulatory Visit (HOSPITAL_COMMUNITY): Payer: Self-pay

## 2023-04-19 ENCOUNTER — Other Ambulatory Visit (HOSPITAL_COMMUNITY): Payer: Self-pay

## 2023-04-19 ENCOUNTER — Other Ambulatory Visit: Payer: Self-pay | Admitting: Internal Medicine

## 2023-04-19 ENCOUNTER — Other Ambulatory Visit: Payer: Self-pay | Admitting: Behavioral Health

## 2023-04-19 DIAGNOSIS — I10 Essential (primary) hypertension: Secondary | ICD-10-CM

## 2023-04-19 DIAGNOSIS — F902 Attention-deficit hyperactivity disorder, combined type: Secondary | ICD-10-CM

## 2023-04-19 MED ORDER — AMPHETAMINE-DEXTROAMPHETAMINE 30 MG PO TABS
30.0000 mg | ORAL_TABLET | Freq: Two times a day (BID) | ORAL | 0 refills | Status: DC
Start: 2023-04-19 — End: 2023-05-30
  Filled 2023-04-19: qty 60, 30d supply, fill #0

## 2023-04-19 NOTE — Telephone Encounter (Signed)
LF 09/19, LV 09/19 NV 12/19

## 2023-04-22 ENCOUNTER — Other Ambulatory Visit: Payer: Self-pay

## 2023-05-01 ENCOUNTER — Other Ambulatory Visit (HOSPITAL_COMMUNITY): Payer: Self-pay

## 2023-05-16 ENCOUNTER — Other Ambulatory Visit: Payer: Self-pay

## 2023-05-16 ENCOUNTER — Other Ambulatory Visit: Payer: Self-pay | Admitting: Internal Medicine

## 2023-05-16 DIAGNOSIS — I10 Essential (primary) hypertension: Secondary | ICD-10-CM

## 2023-05-16 MED ORDER — VALSARTAN 320 MG PO TABS
320.0000 mg | ORAL_TABLET | Freq: Every day | ORAL | 0 refills | Status: DC
  Filled 2023-05-16: qty 30, 30d supply, fill #0
  Filled 2023-06-12: qty 30, 30d supply, fill #1
  Filled 2023-07-17: qty 30, 30d supply, fill #2

## 2023-05-30 ENCOUNTER — Other Ambulatory Visit (HOSPITAL_COMMUNITY): Payer: Self-pay

## 2023-05-30 ENCOUNTER — Encounter: Payer: Self-pay | Admitting: Behavioral Health

## 2023-05-30 ENCOUNTER — Other Ambulatory Visit: Payer: Self-pay

## 2023-05-30 ENCOUNTER — Ambulatory Visit (INDEPENDENT_AMBULATORY_CARE_PROVIDER_SITE_OTHER): Payer: 59 | Admitting: Behavioral Health

## 2023-05-30 DIAGNOSIS — F902 Attention-deficit hyperactivity disorder, combined type: Secondary | ICD-10-CM

## 2023-05-30 DIAGNOSIS — F411 Generalized anxiety disorder: Secondary | ICD-10-CM

## 2023-05-30 DIAGNOSIS — F331 Major depressive disorder, recurrent, moderate: Secondary | ICD-10-CM | POA: Diagnosis not present

## 2023-05-30 MED ORDER — AMPHETAMINE-DEXTROAMPHETAMINE 30 MG PO TABS
30.0000 mg | ORAL_TABLET | Freq: Two times a day (BID) | ORAL | 0 refills | Status: DC
Start: 2023-05-30 — End: 2023-07-30
  Filled 2023-05-30: qty 60, 30d supply, fill #0

## 2023-05-30 MED ORDER — SERTRALINE HCL 100 MG PO TABS
100.0000 mg | ORAL_TABLET | Freq: Every day | ORAL | 1 refills | Status: DC
Start: 2023-05-30 — End: 2023-08-28
  Filled 2023-05-30: qty 90, 90d supply, fill #0
  Filled 2023-06-12: qty 30, 30d supply, fill #0
  Filled 2023-07-17: qty 30, 30d supply, fill #1
  Filled 2023-07-30 – 2023-08-24 (×2): qty 30, 30d supply, fill #2

## 2023-05-30 NOTE — Progress Notes (Signed)
[facility]  Patient ID: Frederick Snyder,  MRN: [id number]  PCP: Etta Grandchild, MD  Date of Evaluation: 05/30/2023 Time spent:30 minutes  Chief Complaint:  Chief Complaint   Anxiety; Depression; ADHD; Follow-up; Medication Refill; Patient Education     HISTORY/CURRENT STATUS: HPI 37 year old male presents to this office for follow up and medication management. 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 Maintaining weight loss. . Feeling much better about his progress. Anxiety and depression has improved..  Since bypass he cannot take ER versions of medication.  Anxiety 2/10, Depression is 2/10. Says he is sleeping 7-8 hours per night. Requesting no medication changes today. Denies mania, no psychosis, no SI/HI.   Previous psychiatric medications: Ritalin Vyvanse Adderall  Zoloft   Individual Medical History/ Review of Systems: Changes? :No   Allergies: Patient has no known allergies.  Current Medications:  Current Outpatient Medications:    amphetamine-dextroamphetamine (ADDERALL) 20 MG tablet, Take 1 tablet (20 mg total) by mouth 2 (two) times daily., Disp: 60 tablet, Rfl: 0   amphetamine-dextroamphetamine (ADDERALL) 30 MG tablet, Take 1 tablet by mouth 2 (two) times daily., Disp: 60 tablet, Rfl: 0   CALCIUM PO, Take 1 tablet by mouth daily., Disp: , Rfl:    Multiple Vitamin (MULTIVITAMIN) tablet, Take 1 tablet by mouth daily., Disp: , Rfl:    sertraline (ZOLOFT) 100 MG tablet, Take 1 tablet (100 mg total) by mouth daily., Disp: 90 tablet, Rfl: 1   valsartan (DIOVAN) 320 MG tablet, Take 1 tablet (320 mg total) by mouth daily., Disp: 90 tablet, Rfl: 0 Medication Side Effects: none  Family Medical/ Social History: Changes? No  MENTAL HEALTH EXAM:  There were no vitals taken for this visit.There is no height or weight on file to calculate BMI.  General Appearance: Casual, Neat, and Well Groomed  Eye Contact:  Good  Speech:  Clear and Coherent  Volume:   Normal  Mood:  NA  Affect:  Appropriate  Thought Process:  Coherent  Orientation:  Full (Time, Place, and Person)  Thought Content: Logical   Suicidal Thoughts:  No  Homicidal Thoughts:  No  Memory:  WNL  Judgement:  Good  Insight:  Good  Psychomotor Activity:  Normal  Concentration:  Concentration: Good  Recall:  Good  Fund of Knowledge: Good  Language: Good  Assets:  Desire for Improvement  ADL's:  Intact  Cognition: WNL  Prognosis:  Good    DIAGNOSES:    ICD-10-CM   1. Attention deficit hyperactivity disorder (ADHD), combined type  F90.2 amphetamine-dextroamphetamine (ADDERALL) 30 MG tablet    2. Major depressive disorder, recurrent episode, moderate (HCC)  F33.1 sertraline (ZOLOFT) 100 MG tablet    3. Generalized anxiety disorder  F41.1 sertraline (ZOLOFT) 100 MG tablet      Receiving Psychotherapy: No    RECOMMENDATIONS:  Greater than 50% of 30  min face to face time with patient was spent on counseling and coordination of care. Discussed his current stability with Zoloft.  ADHD symptoms are well controlled. Happy with current medication and requesting no med changes this visit.   To continue Adderall to 30 mg IR twice daily To continue  Zoloft to 100 mg daily.  To follow up in 3 months to reassess Will monitor BP and HR regularly and log to provide to PCP Will report side effects or worsening symptoms and seek medical attention immediately if BP rises above 180.  Discussed potential benefits, risks, and side effects of stimulants with patient  to include increased heart rate, palpitations, insomnia, increased anxiety, increased irritability, or decreased appetite.  Instructed patient to contact office if experiencing any significant tolerability issues.  Reviewed PDMP     Joan Flores, NP

## 2023-06-12 ENCOUNTER — Other Ambulatory Visit (HOSPITAL_COMMUNITY): Payer: Self-pay

## 2023-06-13 ENCOUNTER — Other Ambulatory Visit: Payer: Self-pay

## 2023-06-20 ENCOUNTER — Ambulatory Visit (INDEPENDENT_AMBULATORY_CARE_PROVIDER_SITE_OTHER): Payer: PRIVATE HEALTH INSURANCE | Admitting: Orthopaedic Surgery

## 2023-06-20 DIAGNOSIS — M7712 Lateral epicondylitis, left elbow: Secondary | ICD-10-CM | POA: Diagnosis not present

## 2023-06-20 NOTE — Progress Notes (Signed)
 Office Visit Note   Patient: Frederick Snyder           Date of Birth: [birth year]           MRN: [id number] Visit Date: 06/20/2023              Requested by: Joshua Debby CROME, MD [address] [place],  [place] [address] PCP: Joshua Debby CROME, MD   Assessment & Plan: Visit Diagnoses:  1. Lateral epicondylitis of left elbow     Plan: Frederick Snyder is a 38 year old gentleman with recalcitrant left lateral epicondylitis.  Has had 3 cortisone injections in the last year.  MRI of the elbow confirms lateral epicondylitis with common extensor tendinosis.  At this point he is elected to move forward with surgical treatment.  Risk benefits prognosis reviewed.  [name] will call him to confirm surgery date.  Follow-Up Instructions: No follow-ups on file.   Orders:  No orders of the defined types were placed in this encounter.  No orders of the defined types were placed in this encounter.     Procedures: No procedures performed   Clinical Data: No additional findings.   Subjective: Chief Complaint  Patient presents with   Left Elbow - Pain    HPI Teyton returns today for follow-up evaluation of left lateral epicondylitis. Review of Systems   Objective: Vital Signs: There were no vitals taken for this visit.  Physical Exam  Ortho Exam Examination left elbow is unchanged from prior visit. Specialty Comments:  No specialty comments available.  Imaging: No results found.   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[facility]) 08/30/2021   Pure hypertriglyceridemia 05/03/2021   OSA on CPAP 05/03/2021   Prediabetes 05/03/2021   Primary hypertension 09/07/2020   Morbid obesity with BMI of 50.0-59.9, adult (HCC) 09/07/2020   Loud snoring 09/07/2020   Encounter for general adult medical examination with abnormal findings 09/07/2020   Vitamin D  deficiency disease 09/07/2020    OCD (obsessive compulsive disorder) 04/06/2018   Attention deficit hyperactivity disorder (ADHD) 04/06/2018   Hyperprolactinemia (HCC) 02/26/2018   Asthma 05/25/2009   Past Medical History:  Diagnosis Date   Anemia    Anxiety    Asthma    Depression    GERD (gastroesophageal reflux disease)    History of kidney stones    Hyperlipidemia    diet controlled - no meds   Hypertension    Migraine    OSA on CPAP 05/03/2021   PONV (postoperative nausea and vomiting)    Vitamin D  deficiency     Family History  Problem Relation Age of Onset   Diabetes Mother    Heart disease Mother    Sleep apnea Mother    Diabetes Father    Heart disease Father    Hypertension Father    Sleep apnea Father    Diabetes Other    Hyperlipidemia Other    Hypertension Other    Other Neg Hx        hypogonadism    Past Surgical History:  Procedure Laterality Date   GASTRIC ROUX-EN-Y N/A 05/28/2022   Procedure: LAPAROSCOPIC ROUX-EN-Y GASTRIC BYPASS WITH UPPER ENDOSCOPY;  Surgeon: Tanda Locus, MD;  Location: [facility];  Service: General;  Laterality: N/A;   INGUINAL HERNIA REPAIR Left 12/08/2020   Procedure: OPEN HERNIA REPAIR INGUINAL ADULT;  Surgeon: Vernetta Berg, MD;  Location: [facility] OR;  Service: General;  Laterality: Left;  TAP BLOCK AND LMA.   INSERTION OF MESH Left 12/08/2020   Procedure: INSERTION OF MESH;  Surgeon: Vernetta Berg, MD;  Location: [facility] OR;  Service: General;  Laterality: Left;   KNEE SURGERY     tubes in ears     UPPER GI ENDOSCOPY N/A 05/28/2022   Procedure: UPPER GI ENDOSCOPY;  Surgeon: Tanda Locus, MD;  Location: [facility];  Service: General;  Laterality: N/A;   WISDOM TOOTH EXTRACTION     Social History   Occupational History   Occupation: Firefighter parts.   Tobacco Use   Smoking status: Never   Smokeless tobacco: Never   Tobacco comments:    Quit in approx 2007  Vaping Use   Vaping status: Never Used  Substance and Sexual Activity   Alcohol use: No   Drug use:  No   Sexual activity: Yes    Partners: Female

## 2023-07-01 NOTE — Progress Notes (Unsigned)
PATIENT: Frederick Snyder DOB: 18-Mar-1986  REASON FOR VISIT: follow up HISTORY FROM: patient PRIMARY NEUROLOGIST: Dr. Frances Furbish  No chief complaint on file.    HISTORY OF PRESENT ILLNESS: Today 07/01/23: Frederick Snyder is a 38 y.o. male with a history of OSA on CPAP. Returns today for follow-up.      07/03/22: Frederick Snyder is a 38 y.o. male with a history of OSA on CPAP. Returns today for follow-up.  Reports that the CPAP is working well.  He denies any new issues.  He returns today for evaluation.       HISTORY   REVIEW OF SYSTEMS: Out of a complete 14 system review of symptoms, the patient complains only of the following symptoms, and all other reviewed systems are negative.  FSS 10 ESS 0  ALLERGIES: No Known Allergies  HOME MEDICATIONS: Outpatient Medications Prior to Visit  Medication Sig Dispense Refill   amphetamine-dextroamphetamine (ADDERALL) 20 MG tablet Take 1 tablet (20 mg total) by mouth 2 (two) times daily. 60 tablet 0   amphetamine-dextroamphetamine (ADDERALL) 30 MG tablet Take 1 tablet by mouth 2 (two) times daily. 60 tablet 0   CALCIUM PO Take 1 tablet by mouth daily.     Multiple Vitamin (MULTIVITAMIN) tablet Take 1 tablet by mouth daily.     sertraline (ZOLOFT) 100 MG tablet Take 1 tablet (100 mg total) by mouth daily. 90 tablet 1   valsartan (DIOVAN) 320 MG tablet Take 1 tablet (320 mg total) by mouth daily. 90 tablet 0   No facility-administered medications prior to visit.    PAST MEDICAL HISTORY: Past Medical History:  Diagnosis Date   Anemia    Anxiety    Asthma    Depression    GERD (gastroesophageal reflux disease)    History of kidney stones    Hyperlipidemia    diet controlled - no meds   Hypertension    Migraine    OSA on CPAP 05/03/2021   PONV (postoperative nausea and vomiting)    Vitamin D deficiency     PAST SURGICAL HISTORY: Past Surgical History:  Procedure Laterality Date   GASTRIC ROUX-EN-Y N/A  05/28/2022   Procedure: LAPAROSCOPIC ROUX-EN-Y GASTRIC BYPASS WITH UPPER ENDOSCOPY;  Surgeon: [name], MD;  Location: [facility];  Service: General;  Laterality: N/A;   INGUINAL HERNIA REPAIR Left 12/08/2020   Procedure: OPEN HERNIA REPAIR INGUINAL ADULT;  Surgeon: Abigail Miyamoto, MD;  Location: [facility] OR;  Service: General;  Laterality: Left;  TAP BLOCK AND LMA.   INSERTION OF MESH Left 12/08/2020   Procedure: INSERTION OF MESH;  Surgeon: Abigail Miyamoto, MD;  Location: [facility] OR;  Service: General;  Laterality: Left;   KNEE SURGERY     tubes in ears     UPPER GI ENDOSCOPY N/A 05/28/2022   Procedure: UPPER GI ENDOSCOPY;  Surgeon: [name], MD;  Location: [facility];  Service: General;  Laterality: N/A;   WISDOM TOOTH EXTRACTION      FAMILY HISTORY: Family History  Problem Relation Age of Onset   Diabetes Mother    Heart disease Mother    Sleep apnea Mother    Diabetes Father    Heart disease Father    Hypertension Father    Sleep apnea Father    Diabetes Other    Hyperlipidemia Other    Hypertension Other    Other Neg Hx        hypogonadism    SOCIAL HISTORY: Social History   Socioeconomic History  Marital status: Married    Spouse name: Not on file   Number of children: Not on file   Years of education: Not on file   Highest education level: Not on file  Occupational History   Occupation: Selling car parts.   Tobacco Use   Smoking status: Never   Smokeless tobacco: Never   Tobacco comments:    Quit in approx 2007  Vaping Use   Vaping status: Never Used  Substance and Sexual Activity   Alcohol use: No   Drug use: No   Sexual activity: Yes    Partners: Female  Other Topics Concern   Not on file  Social History Narrative   Lives at home with wife and child   Social Drivers of Health   Financial Resource Strain: Not on file  Food Insecurity: Patient Declined (05/28/2022)   Hunger Vital Sign    Worried About Running Out of Food in the Last Year: Patient  declined    Ran Out of Food in the Last Year: Patient declined  Transportation Needs: No Transportation Needs (05/28/2022)   PRAPARE - Administrator, Civil Service (Medical): No    Lack of Transportation (Non-Medical): No  Physical Activity: Not on file  Stress: Not on file  Social Connections: Not on file  Intimate Partner Violence: Patient Declined (05/28/2022)   Humiliation, Afraid, Rape, and Kick questionnaire    Fear of Current or Ex-Partner: Patient declined    Emotionally Abused: Patient declined    Physically Abused: Patient declined    Sexually Abused: Patient declined      PHYSICAL EXAM  There were no vitals filed for this visit.  There is no height or weight on file to calculate BMI.  Generalized: Well developed, in no acute distress  Chest: Lungs clear to auscultation bilaterally  Neurological examination  Mentation: Alert oriented to time, place, history taking. Follows all commands speech and language fluent Cranial nerve II-XII: Extraocular movements were full, visual field were full on confrontational test Head turning and shoulder shrug  were normal and symmetric. Gait and station: Gait is normal.    DIAGNOSTIC DATA (LABS, IMAGING, TESTING) - I reviewed patient records, labs, notes, testing and imaging myself where available.  Lab Results  Component Value Date   WBC 8.3 01/05/2023   HGB 14.8 01/05/2023   HCT 43.3 01/05/2023   MCV 84.4 01/05/2023   PLT 263 01/05/2023      Component Value Date/Time   NA 137 01/05/2023 0002   K 3.4 (L) 01/05/2023 0002   CL 101 01/05/2023 0002   CO2 23 01/05/2023 0002   GLUCOSE 101 (H) 01/05/2023 0002   BUN 11 01/05/2023 0002   CREATININE 0.83 01/05/2023 0002   CALCIUM 9.2 01/05/2023 0002   PROT 7.7 01/05/2023 0002   ALBUMIN 4.4 01/05/2023 0002   AST 17 01/05/2023 0002   ALT 24 01/05/2023 0002   ALKPHOS 100 01/05/2023 0002   BILITOT 0.9 01/05/2023 0002   GFRNONAA >60 01/05/2023 0002   Lab Results   Component Value Date   CHOL 191 03/01/2022   HDL 23.40 (L) 03/01/2022   LDLCALC 129 (H) 03/01/2022   LDLDIRECT 114.0 09/07/2020   TRIG 193.0 (H) 03/01/2022   CHOLHDL 8 03/01/2022   Lab Results  Component Value Date   HGBA1C 5.8 (H) 03/01/2022   No results found for: "VITAMINB12" Lab Results  Component Value Date   TSH 1.51 03/01/2022      ASSESSMENT AND PLAN 38 y.o. year  old male  has a past medical history of Anemia, Anxiety, Asthma, Depression, GERD (gastroesophageal reflux disease), History of kidney stones, Hyperlipidemia, Hypertension, Migraine, OSA on CPAP (05/03/2021), PONV (postoperative nausea and vomiting), and Vitamin D deficiency. here with:  OSA on CPAP  - CPAP compliance excellent - Good treatment of AHI  - Encourage patient to use CPAP nightly and > 4 hours each night - F/U in 1 year or sooner if needed   Butch Penny, MSN, NP-C 07/01/2023, 4:15 PM [facility] [address] [place], [place] [address] [phone]

## 2023-07-02 ENCOUNTER — Ambulatory Visit: Payer: PRIVATE HEALTH INSURANCE | Admitting: Adult Health

## 2023-07-02 ENCOUNTER — Encounter: Payer: Self-pay | Admitting: Adult Health

## 2023-07-02 VITALS — BP 132/87 | HR 84 | Ht 72.0 in | Wt 314.0 lb

## 2023-07-02 DIAGNOSIS — G4733 Obstructive sleep apnea (adult) (pediatric): Secondary | ICD-10-CM

## 2023-07-02 NOTE — Patient Instructions (Signed)
Continue using CPAP nightly and greater than 4 hours each night °If your symptoms worsen or you develop new symptoms please let us know.  ° °

## 2023-07-16 ENCOUNTER — Other Ambulatory Visit: Payer: Self-pay

## 2023-07-16 ENCOUNTER — Encounter (HOSPITAL_BASED_OUTPATIENT_CLINIC_OR_DEPARTMENT_OTHER): Payer: Self-pay

## 2023-07-16 ENCOUNTER — Encounter (HOSPITAL_BASED_OUTPATIENT_CLINIC_OR_DEPARTMENT_OTHER): Payer: Self-pay | Admitting: Orthopaedic Surgery

## 2023-07-16 NOTE — Progress Notes (Signed)
 Pt states he currently has runny nose and congestion. Denies fever, body aches, or any other symptoms. Also denies recent exposure to flu or COVID. Instructed to notify [facility] if new symptoms arise. Dr. Jefm, anesthesiologist notified. Okay to proceed with surgery as scheduled pending significant changes to patient health, per aforesaid.

## 2023-07-17 ENCOUNTER — Other Ambulatory Visit (HOSPITAL_COMMUNITY): Payer: Self-pay

## 2023-07-18 ENCOUNTER — Other Ambulatory Visit (HOSPITAL_COMMUNITY): Payer: Self-pay

## 2023-07-18 ENCOUNTER — Other Ambulatory Visit: Payer: Self-pay | Admitting: Physician Assistant

## 2023-07-18 MED ORDER — ONDANSETRON HCL 4 MG PO TABS
4.0000 mg | ORAL_TABLET | Freq: Three times a day (TID) | ORAL | 0 refills | Status: DC | PRN
  Filled 2023-07-18: qty 18, 6d supply, fill #0

## 2023-07-18 MED ORDER — HYDROCODONE-ACETAMINOPHEN 5-325 MG PO TABS
1.0000 | ORAL_TABLET | Freq: Three times a day (TID) | ORAL | 0 refills | Status: DC | PRN
  Filled 2023-07-18: qty 20, 7d supply, fill #0

## 2023-07-23 ENCOUNTER — Ambulatory Visit: Payer: PRIVATE HEALTH INSURANCE | Admitting: Orthopaedic Surgery

## 2023-07-24 DIAGNOSIS — Z01818 Encounter for other preprocedural examination: Secondary | ICD-10-CM

## 2023-07-26 ENCOUNTER — Other Ambulatory Visit: Payer: Self-pay | Admitting: Physician Assistant

## 2023-07-30 ENCOUNTER — Other Ambulatory Visit: Payer: Self-pay | Admitting: Behavioral Health

## 2023-07-30 ENCOUNTER — Other Ambulatory Visit: Payer: Self-pay

## 2023-07-30 ENCOUNTER — Other Ambulatory Visit (HOSPITAL_COMMUNITY): Payer: Self-pay

## 2023-07-30 DIAGNOSIS — F902 Attention-deficit hyperactivity disorder, combined type: Secondary | ICD-10-CM

## 2023-07-30 MED ORDER — AMPHETAMINE-DEXTROAMPHETAMINE 30 MG PO TABS
30.0000 mg | ORAL_TABLET | Freq: Two times a day (BID) | ORAL | 0 refills | Status: DC
  Filled 2023-07-30: qty 60, 30d supply, fill #0

## 2023-07-31 ENCOUNTER — Encounter: Payer: PRIVATE HEALTH INSURANCE | Admitting: Orthopaedic Surgery

## 2023-07-31 ENCOUNTER — Other Ambulatory Visit: Payer: Self-pay

## 2023-08-06 NOTE — Progress Notes (Signed)

## 2023-08-07 ENCOUNTER — Other Ambulatory Visit: Payer: Self-pay

## 2023-08-07 ENCOUNTER — Ambulatory Visit (HOSPITAL_BASED_OUTPATIENT_CLINIC_OR_DEPARTMENT_OTHER): Payer: PRIVATE HEALTH INSURANCE | Admitting: Anesthesiology

## 2023-08-07 ENCOUNTER — Encounter (HOSPITAL_BASED_OUTPATIENT_CLINIC_OR_DEPARTMENT_OTHER)
Admission: RE | Disposition: A | Discharge status: Home / Self Care | Payer: Self-pay | Source: Home / Self Care | Attending: Orthopaedic Surgery

## 2023-08-07 ENCOUNTER — Encounter (HOSPITAL_BASED_OUTPATIENT_CLINIC_OR_DEPARTMENT_OTHER): Payer: Self-pay | Admitting: Orthopaedic Surgery

## 2023-08-07 ENCOUNTER — Ambulatory Visit (HOSPITAL_BASED_OUTPATIENT_CLINIC_OR_DEPARTMENT_OTHER)
Admission: RE | Admit: 2023-08-07 | Discharge: 2023-08-07 | Disposition: A | Discharge status: Home / Self Care | Payer: PRIVATE HEALTH INSURANCE | Attending: Orthopaedic Surgery | Admitting: Orthopaedic Surgery

## 2023-08-07 DIAGNOSIS — J45909 Unspecified asthma, uncomplicated: Secondary | ICD-10-CM

## 2023-08-07 DIAGNOSIS — M1990 Unspecified osteoarthritis, unspecified site: Secondary | ICD-10-CM | POA: Insufficient documentation

## 2023-08-07 DIAGNOSIS — Z8249 Family history of ischemic heart disease and other diseases of the circulatory system: Secondary | ICD-10-CM | POA: Insufficient documentation

## 2023-08-07 DIAGNOSIS — G4730 Sleep apnea, unspecified: Secondary | ICD-10-CM | POA: Diagnosis not present

## 2023-08-07 DIAGNOSIS — Z79899 Other long term (current) drug therapy: Secondary | ICD-10-CM | POA: Insufficient documentation

## 2023-08-07 DIAGNOSIS — F418 Other specified anxiety disorders: Secondary | ICD-10-CM

## 2023-08-07 DIAGNOSIS — I10 Essential (primary) hypertension: Secondary | ICD-10-CM | POA: Insufficient documentation

## 2023-08-07 DIAGNOSIS — Z01818 Encounter for other preprocedural examination: Secondary | ICD-10-CM

## 2023-08-07 DIAGNOSIS — M7712 Lateral epicondylitis, left elbow: Secondary | ICD-10-CM | POA: Diagnosis present

## 2023-08-07 DIAGNOSIS — Z6841 Body Mass Index (BMI) 40.0 and over, adult: Secondary | ICD-10-CM | POA: Diagnosis not present

## 2023-08-07 DIAGNOSIS — F901 Attention-deficit hyperactivity disorder, predominantly hyperactive type: Secondary | ICD-10-CM | POA: Insufficient documentation

## 2023-08-07 DIAGNOSIS — E66813 Obesity, class 3: Secondary | ICD-10-CM | POA: Diagnosis not present

## 2023-08-07 HISTORY — PX: TENNIS ELBOW RELEASE/NIRSCHEL PROCEDURE: SHX6651

## 2023-08-07 SURGERY — TENNIS ELBOW RELEASE/NIRSCHEL PROCEDURE
Anesthesia: Regional | Site: Elbow | Laterality: Left

## 2023-08-07 MED ORDER — ONDANSETRON HCL 4 MG/2ML IJ SOLN
INTRAMUSCULAR | Status: DC | PRN
  Administered 2023-08-07: 4 mg via INTRAVENOUS

## 2023-08-07 MED ORDER — KETAMINE HCL 10 MG/ML IJ SOLN
INTRAMUSCULAR | Status: DC | PRN
  Administered 2023-08-07: 20 mg via INTRAVENOUS

## 2023-08-07 MED ORDER — FENTANYL CITRATE (PF) 100 MCG/2ML IJ SOLN
INTRAMUSCULAR | Status: DC | PRN
  Administered 2023-08-07: 100 ug via INTRAVENOUS

## 2023-08-07 MED ORDER — DEXAMETHASONE SODIUM PHOSPHATE 10 MG/ML IJ SOLN
INTRAMUSCULAR | Status: DC | PRN
  Administered 2023-08-07: 4 mg via INTRAVENOUS

## 2023-08-07 MED ORDER — CEFAZOLIN SODIUM-DEXTROSE 3-4 GM/150ML-% IV SOLN
3.0000 g | INTRAVENOUS | Status: AC
  Administered 2023-08-07: 3 g via INTRAVENOUS

## 2023-08-07 MED ORDER — MIDAZOLAM HCL 2 MG/2ML IJ SOLN
INTRAMUSCULAR | Status: AC
  Filled 2023-08-07: qty 2

## 2023-08-07 MED ORDER — MIDAZOLAM HCL 2 MG/2ML IJ SOLN
2.0000 mg | Freq: Once | INTRAMUSCULAR | Status: AC
  Administered 2023-08-07: 2 mg via INTRAVENOUS

## 2023-08-07 MED ORDER — LACTATED RINGERS IV SOLN: INTRAVENOUS | Status: DC

## 2023-08-07 MED ORDER — BUPIVACAINE-EPINEPHRINE (PF) 0.25% -1:200000 IJ SOLN
INTRAMUSCULAR | Status: AC
  Filled 2023-08-07: qty 30

## 2023-08-07 MED ORDER — ACETAMINOPHEN 500 MG PO TABS
1000.0000 mg | ORAL_TABLET | Freq: Once | ORAL | Status: AC
  Administered 2023-08-07: 1000 mg via ORAL

## 2023-08-07 MED ORDER — FENTANYL CITRATE (PF) 100 MCG/2ML IJ SOLN
100.0000 ug | Freq: Once | INTRAMUSCULAR | Status: AC
  Administered 2023-08-07: 50 ug via INTRAVENOUS

## 2023-08-07 MED ORDER — KETAMINE HCL 50 MG/5ML IJ SOSY
PREFILLED_SYRINGE | INTRAMUSCULAR | Status: AC
  Filled 2023-08-07: qty 5

## 2023-08-07 MED ORDER — 0.9 % SODIUM CHLORIDE (POUR BTL) OPTIME
TOPICAL | Status: DC | PRN
  Administered 2023-08-07: 1000 mL

## 2023-08-07 MED ORDER — CEFAZOLIN SODIUM-DEXTROSE 3-4 GM/150ML-% IV SOLN
INTRAVENOUS | Status: AC
  Filled 2023-08-07: qty 150

## 2023-08-07 MED ORDER — BUPIVACAINE-EPINEPHRINE 0.25% -1:200000 IJ SOLN
INTRAMUSCULAR | Status: DC | PRN
  Administered 2023-08-07: 20 mL

## 2023-08-07 MED ORDER — BUPIVACAINE-EPINEPHRINE (PF) 0.5% -1:200000 IJ SOLN
INTRAMUSCULAR | Status: DC | PRN
  Administered 2023-08-07: 30 mL via PERINEURAL

## 2023-08-07 MED ORDER — DEXMEDETOMIDINE HCL IN NACL 80 MCG/20ML IV SOLN
INTRAVENOUS | Status: DC | PRN
Start: 2023-08-07 — End: 2023-08-07
  Administered 2023-08-07: 20 ug via INTRAVENOUS

## 2023-08-07 MED ORDER — CEFAZOLIN SODIUM-DEXTROSE 2-4 GM/100ML-% IV SOLN
INTRAVENOUS | Status: AC
  Filled 2023-08-07: qty 100

## 2023-08-07 MED ORDER — DEXMEDETOMIDINE HCL IN NACL 80 MCG/20ML IV SOLN
INTRAVENOUS | Status: AC
  Filled 2023-08-07: qty 20

## 2023-08-07 MED ORDER — PROPOFOL 10 MG/ML IV BOLUS
INTRAVENOUS | Status: DC | PRN
  Administered 2023-08-07: 300 mg via INTRAVENOUS
  Administered 2023-08-07 (×4): 40 mg via INTRAVENOUS

## 2023-08-07 MED ORDER — FENTANYL CITRATE (PF) 100 MCG/2ML IJ SOLN
INTRAMUSCULAR | Status: AC
  Filled 2023-08-07: qty 2

## 2023-08-07 MED ORDER — PROPOFOL 500 MG/50ML IV EMUL
INTRAVENOUS | Status: DC | PRN
Start: 2023-08-07 — End: 2023-08-07
  Administered 2023-08-07: 75 ug/kg/min via INTRAVENOUS

## 2023-08-07 MED ORDER — ACETAMINOPHEN 500 MG PO TABS
ORAL_TABLET | ORAL | Status: AC
  Filled 2023-08-07: qty 2

## 2023-08-07 SURGICAL SUPPLY — 58 items
ANCHOR SUT 1.45 SZ 1 SHORT (Anchor) IMPLANT
BLADE SURG 15 STRL LF DISP TIS (BLADE) ×2 IMPLANT
BNDG ELASTIC 3INX 5YD STR LF (GAUZE/BANDAGES/DRESSINGS) ×1 IMPLANT
BNDG ELASTIC 4INX 5YD STR LF (GAUZE/BANDAGES/DRESSINGS) ×1 IMPLANT
BNDG ESMARK 4X9 LF (GAUZE/BANDAGES/DRESSINGS) ×1 IMPLANT
BRUSH SCRUB EZ PLAIN DRY (MISCELLANEOUS) ×1 IMPLANT
CLSR STERI-STRIP ANTIMIC 1/2X4 (GAUZE/BANDAGES/DRESSINGS) ×1 IMPLANT
CORD BIPOLAR FORCEPS 12FT (ELECTRODE) ×1 IMPLANT
COVER BACK TABLE 60X90IN (DRAPES) ×1 IMPLANT
COVER MAYO STAND STRL (DRAPES) IMPLANT
CUFF TOURN SGL QUICK 18X3 (MISCELLANEOUS) ×1 IMPLANT
CUFF TOURN SGL QUICK 18X4 (TOURNIQUET CUFF) IMPLANT
DRAPE EXTREMITY T 121X128X90 (DISPOSABLE) ×1 IMPLANT
DRAPE U-SHAPE 47X51 STRL (DRAPES) ×1 IMPLANT
GAUZE PAD ABD 8X10 STRL (GAUZE/BANDAGES/DRESSINGS) ×1 IMPLANT
GAUZE SPONGE 4X4 12PLY STRL (GAUZE/BANDAGES/DRESSINGS) ×1 IMPLANT
GAUZE XEROFORM 1X8 LF (GAUZE/BANDAGES/DRESSINGS) IMPLANT
GLOVE BIOGEL PI IND STRL 7.5 (GLOVE) ×1 IMPLANT
GLOVE ECLIPSE 7.0 STRL STRAW (GLOVE) ×1 IMPLANT
GLOVE INDICATOR 7.0 STRL GRN (GLOVE) ×1 IMPLANT
GLOVE SURG SYN 7.5 E (GLOVE) ×1 IMPLANT
GLOVE SURG SYN 7.5 PF PI (GLOVE) ×1 IMPLANT
GOWN STRL REUS W/ TWL LRG LVL3 (GOWN DISPOSABLE) ×1 IMPLANT
GOWN STRL REUS W/ TWL XL LVL3 (GOWN DISPOSABLE) ×1 IMPLANT
GOWN STRL SURGICAL XL XLNG (GOWN DISPOSABLE) ×2 IMPLANT
LOOP VASCLR MAXI BLUE 18IN ST (MISCELLANEOUS) IMPLANT
NDL HYPO 25X1 1.5 SAFETY (NEEDLE) IMPLANT
NDL SUT 6 .5 CRC .975X.05 MAYO (NEEDLE) IMPLANT
NEEDLE HYPO 25X1 1.5 SAFETY (NEEDLE) IMPLANT
NS IRRIG 1000ML POUR BTL (IV SOLUTION) ×1 IMPLANT
PACK BASIN DAY SURGERY FS (CUSTOM PROCEDURE TRAY) ×1 IMPLANT
PAD CAST 3X4 CTTN HI CHSV (CAST SUPPLIES) ×1 IMPLANT
PADDING CAST ABS COTTON 4X4 ST (CAST SUPPLIES) IMPLANT
PADDING CAST SYNTHETIC 3X4 NS (CAST SUPPLIES) ×1 IMPLANT
PADDING CAST SYNTHETIC 4X4 STR (CAST SUPPLIES) ×1 IMPLANT
SHEET MEDIUM DRAPE 40X70 STRL (DRAPES) ×1 IMPLANT
SLEEVE SCD COMPRESS KNEE MED (STOCKING) ×1 IMPLANT
SLING ARM FOAM STRAP LRG (SOFTGOODS) ×1 IMPLANT
SPIKE FLUID TRANSFER (MISCELLANEOUS) IMPLANT
SPLINT FIBERGLASS 3X35 (CAST SUPPLIES) ×1 IMPLANT
SPLINT FIBERGLASS 4X30 (CAST SUPPLIES) IMPLANT
SPLINT PLASTER CAST XFAST 3X15 (CAST SUPPLIES) IMPLANT
STOCKINETTE 4X48 STRL (DRAPES) ×1 IMPLANT
STOCKINETTE 6 STRL (DRAPES) IMPLANT
STRIP CLOSURE SKIN 1/4X4 (GAUZE/BANDAGES/DRESSINGS) IMPLANT
SUT ETHILON 4 0 PS 2 18 (SUTURE) IMPLANT
SUT MNCRL AB 4-0 PS2 18 (SUTURE) ×1 IMPLANT
SUT VIC AB 2-0 CT1 TAPERPNT 27 (SUTURE) IMPLANT
SUT VIC AB 3-0 SH 27X BRD (SUTURE) ×1 IMPLANT
SUT VIC AB 4-0 P-3 18XBRD (SUTURE) IMPLANT
SUT VIC AB 4-0 P2 18 (SUTURE) IMPLANT
SUT VICRYL 0 CT-2 (SUTURE) ×1 IMPLANT
SYR BULB EAR ULCER 3OZ GRN STR (SYRINGE) ×1 IMPLANT
SYR CONTROL 10ML LL (SYRINGE) IMPLANT
TIE VASCULAR MAXI BLUE 18IN ST (MISCELLANEOUS) IMPLANT
TOWEL GREEN STERILE FF (TOWEL DISPOSABLE) ×2 IMPLANT
TRAY DSU PREP LF (CUSTOM PROCEDURE TRAY) ×1 IMPLANT
UNDERPAD 30X36 HEAVY ABSORB (UNDERPADS AND DIAPERS) ×1 IMPLANT

## 2023-08-07 NOTE — Discharge Instructions (Addendum)
 Postoperative instructions:  Weightbearing instructions: as tolerated  Dressing instructions: Keep your dressing and/or splint clean and dry at all times.  It will be removed at your first post-operative appointment.  Your stitches and/or staples will be removed at this visit.  Incision instructions:  Do not soak your incision for 3 weeks after surgery.  If the incision gets wet, pat dry and do not scrub the incision.  Pain control:  You have been given a prescription to be taken as directed for post-operative pain control.  In addition, elevate the operative extremity above the heart at all times to prevent swelling and throbbing pain.  Take over-the-counter Colace, 100mg  by mouth twice a day while taking narcotic pain medications to help prevent constipation.  Follow up appointments: 1) 7 days for suture removal and wound check. 2) Dr. Roda Shutters as scheduled.   -------------------------------------------------------------------------------------------------------------  After Surgery Pain Control:  After your surgery, post-surgical discomfort or pain is likely. This discomfort can last several days to a few weeks. At certain times of the day your discomfort may be more intense.  Did you receive a nerve block?  A nerve block can provide pain relief for one hour to two days after your surgery. As long as the nerve block is working, you will experience little or no sensation in the area the surgeon operated on.  As the nerve block wears off, you will begin to experience pain or discomfort. It is very important that you begin taking your prescribed pain medication before the nerve block fully wears off. Treating your pain at the first sign of the block wearing off will ensure your pain is better controlled and more tolerable when full-sensation returns. Do not wait until the pain is intolerable, as the medicine will be less effective. It is better to treat pain in advance than to try and catch up.   General Anesthesia:  If you did not receive a nerve block during your surgery, you will need to start taking your pain medication shortly after your surgery and should continue to do so as prescribed by your surgeon.  Pain Medication:  Most commonly we prescribe Vicodin and Percocet for post-operative pain. Both of these medications contain a combination of acetaminophen (Tylenol) and a narcotic to help control pain.   It takes between 30 and 45 minutes before pain medication starts to work. It is important to take your medication before your pain level gets too intense.   Nausea is a common side effect of many pain medications. You will want to eat something before taking your pain medicine to help prevent nausea.   If you are taking a prescription pain medication that contains acetaminophen, we recommend that you do not take additional over the counter acetaminophen (Tylenol).  Other pain relieving options:   Using a cold pack to ice the affected area a few times a day (15 to 20 minutes at a time) can help to relieve pain, reduce swelling and bruising.   Elevation of the affected area can also help to reduce pain and swelling.  Per [facility] clinic policy, our goal is ensure optimal postoperative pain control with a multimodal pain management strategy. For all [facility] patients, our goal is to wean post-operative narcotic medications by 6 weeks post-operatively. If this is not possible due to utilization of pain medication prior to surgery, your [facility] doctor will support your acute post-operative pain control for the first 6 weeks postoperatively, with a plan to transition you back to your primary  pain team following that. Cyndia Skeeters will work to ensure a Therapist, occupational.    Post Anesthesia Home Care Instructions  Activity: Get plenty of rest for the remainder of the day. A responsible individual must stay with you for 24 hours following the procedure.  For the next 24 hours, DO  NOT: -Drive a car -Advertising copywriter -Drink alcoholic beverages -Take any medication unless instructed by your physician -Make any legal decisions or sign important papers.  Meals: Start with liquid foods such as gelatin or soup. Progress to regular foods as tolerated. Avoid greasy, spicy, heavy foods. If nausea and/or vomiting occur, drink only clear liquids until the nausea and/or vomiting subsides. Call your physician if vomiting continues.  Special Instructions/Symptoms: Your throat may feel dry or sore from the anesthesia or the breathing tube placed in your throat during surgery. If this causes discomfort, gargle with warm salt water. The discomfort should disappear within 24 hours.  If you had a scopolamine patch placed behind your ear for the management of post- operative nausea and/or vomiting:  1. The medication in the patch is effective for 72 hours, after which it should be removed.  Wrap patch in a tissue and discard in the trash. Wash hands thoroughly with soap and water. 2. You may remove the patch earlier than 72 hours if you experience unpleasant side effects which may include dry mouth, dizziness or visual disturbances. 3. Avoid touching the patch. Wash your hands with soap and water after contact with the patch.  Regional Anesthesia Blocks  1. You may not be able to move or feel the "blocked" extremity after a regional anesthetic block. This may last may last from 3-48 hours after placement, but it will go away. The length of time depends on the medication injected and your individual response to the medication. As the nerves start to wake up, you may experience tingling as the movement and feeling returns to your extremity. If the numbness and inability to move your extremity has not gone away after 48 hours, please call your surgeon.   2. The extremity that is blocked will need to be protected until the numbness is gone and the strength has returned. Because you cannot  feel it, you will need to take extra care to avoid injury. Because it may be weak, you may have difficulty moving it or using it. You may not know what position it is in without looking at it while the block is in effect.  3. For blocks in the legs and feet, returning to weight bearing and walking needs to be done carefully. You will need to wait until the numbness is entirely gone and the strength has returned. You should be able to move your leg and foot normally before you try and bear weight or walk. You will need someone to be with you when you first try to ensure you do not fall and possibly risk injury.  4. Bruising and tenderness at the needle site are common side effects and will resolve in a few days.  5. Persistent numbness or new problems with movement should be communicated to the surgeon or the [facility] [phone] [facility] [phone]).  No tylenol until after 6:30pm tonight if needed.

## 2023-08-07 NOTE — Anesthesia Procedure Notes (Signed)
 Procedure Name: Intubation Date/Time: 08/07/2023 1:49 PM  Performed by: Thornell Mule, CRNAPre-anesthesia Checklist: Patient identified, Emergency Drugs available, Suction available and Patient being monitored Patient Re-evaluated:Patient Re-evaluated prior to induction Oxygen Delivery Method: Circle system utilized Preoxygenation: Pre-oxygenation with 100% oxygen Induction Type: IV induction Ventilation: Mask ventilation without difficulty LMA Size: 4.0 Tube type: Oral Number of attempts: 1 Airway Equipment and Method: Stylet and Oral airway Placement Confirmation: ETT inserted through vocal cords under direct vision, positive ETCO2 and breath sounds checked- equal and bilateral Tube secured with: Tape Dental Injury: Teeth and Oropharynx as per pre-operative assessment

## 2023-08-07 NOTE — Progress Notes (Signed)
 Assisted Dr. Bradley Ferris with left, supraclavicular, ultrasound guided block. Side rails up, monitors on throughout procedure. See vital signs in flow sheet. Tolerated Procedure well.

## 2023-08-07 NOTE — Transfer of Care (Signed)
 Immediate Anesthesia Transfer of Care Note  Patient: Frederick Snyder  Procedure(s) Performed: LEFT TENNIS ELBOW RELEASE (Left: Elbow)  Patient Location: PACU  Anesthesia Type:GA combined with regional for post-op pain  Level of Consciousness: drowsy and patient cooperative  Airway & Oxygen Therapy: Patient Spontanous Breathing and Patient connected to face mask oxygen  Post-op Assessment: Report given to RN and Post -op Vital signs reviewed and stable  Post vital signs: Reviewed and stable  Last Vitals:  Vitals Value Taken Time  BP    Temp    Pulse 67 08/07/23 1418  Resp    SpO2 93 % 08/07/23 1418  Vitals shown include unfiled device data.  Last Pain:  Vitals:   08/07/23 1225  TempSrc: Tympanic  PainSc: 0-No pain      Patients Stated Pain Goal: 8 (08/07/23 1225)  Complications: No notable events documented.

## 2023-08-07 NOTE — Anesthesia Postprocedure Evaluation (Signed)
 Anesthesia Post Note  Patient: Frederick Snyder  Procedure(s) Performed: LEFT TENNIS ELBOW RELEASE (Left: Elbow)     Patient location during evaluation: PACU Anesthesia Type: Regional and General Level of consciousness: awake Pain management: pain level controlled Vital Signs Assessment: post-procedure vital signs reviewed and stable Respiratory status: spontaneous breathing, nonlabored ventilation and respiratory function stable Cardiovascular status: blood pressure returned to baseline and stable Postop Assessment: no apparent nausea or vomiting Anesthetic complications: no   No notable events documented.  Last Vitals:  Vitals:   08/07/23 1445 08/07/23 1515  BP: 105/68 111/78  Pulse: 64 68  Resp: 12 16  Temp:  (!) 36.2 C  SpO2: 96% 96%    Last Pain:  Vitals:   08/07/23 1515  TempSrc:   PainSc: 0-No pain                 Jarika Robben P Amogh Komatsu

## 2023-08-07 NOTE — Anesthesia Preprocedure Evaluation (Addendum)
 Anesthesia Evaluation  Patient identified by MRN, date of birth, ID band Patient awake    Reviewed: Allergy & Precautions, NPO status , Patient's Chart, lab work & pertinent test results  History of Anesthesia Complications (+) PONV and history of anesthetic complications  Airway Mallampati: II  TM Distance: >3 FB Neck ROM: Full    Dental no notable dental hx.    Pulmonary asthma , sleep apnea and Continuous Positive Airway Pressure Ventilation    Pulmonary exam normal        Cardiovascular hypertension, Pt. on medications Normal cardiovascular exam     Neuro/Psych  Headaches PSYCHIATRIC DISORDERS Anxiety Depression       GI/Hepatic negative GI ROS, Neg liver ROS,,,  Endo/Other    Class 3 obesity  Renal/GU negative Renal ROS     Musculoskeletal  (+) Arthritis ,    Abdominal  (+) + obese  Peds  (+) ADHD Hematology negative hematology ROS (+)   Anesthesia Other Findings left tennis elbow  Reproductive/Obstetrics                             Anesthesia Physical Anesthesia Plan  ASA: 3  Anesthesia Plan: Regional   Post-op Pain Management:    Induction:   PONV Risk Score and Plan: 2 and Ondansetron, Dexamethasone, Propofol infusion, Midazolam and Treatment may vary due to age or medical condition  Airway Management Planned: Simple Face Mask  Additional Equipment:   Intra-op Plan:   Post-operative Plan:   Informed Consent: I have reviewed the patients History and Physical, chart, labs and discussed the procedure including the risks, benefits and alternatives for the proposed anesthesia with the patient or authorized representative who has indicated his/her understanding and acceptance.     Dental advisory given  Plan Discussed with: CRNA  Anesthesia Plan Comments:        Anesthesia Quick Evaluation

## 2023-08-07 NOTE — H&P (Signed)
 PREOPERATIVE H&P  Chief Complaint: left tennis elbow  HPI: Frederick Snyder is a 38 y.o. male who presents for surgical treatment of left tennis elbow.  He denies any changes in medical history.  Past Surgical History:  Procedure Laterality Date  . GASTRIC ROUX-EN-Y N/A 05/28/2022   Procedure: LAPAROSCOPIC ROUX-EN-Y GASTRIC BYPASS WITH UPPER ENDOSCOPY;  Surgeon: [name], MD;  Location: [facility];  Service: General;  Laterality: N/A;  . INGUINAL HERNIA REPAIR Left 12/08/2020   Procedure: OPEN HERNIA REPAIR INGUINAL ADULT;  Surgeon: Abigail Miyamoto, MD;  Location: [facility];  Service: General;  Laterality: Left;  TAP BLOCK AND LMA.  . INSERTION OF MESH Left 12/08/2020   Procedure: INSERTION OF MESH;  Surgeon: Abigail Miyamoto, MD;  Location: [facility] OR;  Service: General;  Laterality: Left;  . KNEE SURGERY    . tubes in ears    . UPPER GI ENDOSCOPY N/A 05/28/2022   Procedure: UPPER GI ENDOSCOPY;  Surgeon: [name], MD;  Location: [facility];  Service: General;  Laterality: N/A;  . WISDOM TOOTH EXTRACTION     Social History   Socioeconomic History  . Marital status: Married    Spouse name: Not on file  . Number of children: Not on file  . Years of education: Not on file  . Highest education level: Not on file  Occupational History  . Occupation: Firefighter parts.   Tobacco Use  . Smoking status: Never  . Smokeless tobacco: Never  . Tobacco comments:    Quit in approx 2007  Vaping Use  . Vaping status: Never Used  Substance and Sexual Activity  . Alcohol use: No  . Drug use: No  . Sexual activity: Yes    Partners: Female  Other Topics Concern  . Not on file  Social History Narrative   Lives at home with wife and child   Social Drivers of Health   Financial Resource Strain: Not on file  Food Insecurity: Patient Declined (05/28/2022)   Hunger Vital Sign   . Worried About Programme researcher, broadcasting/film/video in the Last Year: Patient declined   . Ran Out of Food in the Last Year:  Patient declined  Transportation Needs: No Transportation Needs (05/28/2022)   PRAPARE - Transportation   . Lack of Transportation (Medical): No   . Lack of Transportation (Non-Medical): No  Physical Activity: Not on file  Stress: Not on file  Social Connections: Not on file   Family History  Problem Relation Age of Onset  . Diabetes Mother   . Heart disease Mother   . Sleep apnea Mother   . Diabetes Father   . Heart disease Father   . Hypertension Father   . Sleep apnea Father   . Diabetes Other   . Hyperlipidemia Other   . Hypertension Other   . Other Neg Hx        hypogonadism   Allergies  Allergen Reactions  . Nitrous Oxide Nausea And Vomiting   Prior to Admission medications   Medication Sig Start Date End Date Taking? Authorizing Provider  CALCIUM PO Take 1 tablet by mouth daily.   Yes [provider]  Multiple Vitamin (MULTIVITAMIN) tablet Take 1 tablet by mouth daily.   Yes [provider]  sertraline (ZOLOFT) 100 MG tablet Take 1 tablet (100 mg total) by mouth daily. 05/30/23  Yes Avelina Laine A, NP  valsartan (DIOVAN) 320 MG tablet Take 1 tablet (320 mg total) by mouth daily. 05/16/23  Yes  Etta Grandchild, MD  amphetamine-dextroamphetamine (ADDERALL) 30 MG tablet Take 1 tablet by mouth 2 (two) times daily. 07/30/23   Joan Flores, NP  HYDROcodone-acetaminophen (NORCO) 5-325 MG tablet Take 1 tablet by mouth 3 (three) times daily as needed. To be taken after surgery 07/18/23   Cristie Hem, PA-C  ondansetron (ZOFRAN) 4 MG tablet Take 1 tablet (4 mg total) by mouth every 8 (eight) hours as needed for nausea or vomiting. 07/18/23   Cristie Hem, PA-C     Positive ROS: All other systems have been reviewed and were otherwise negative with the exception of those mentioned in the HPI and as above.  Physical Exam: General: Alert, no acute distress Cardiovascular: No pedal edema Respiratory: No cyanosis, no use of accessory musculature GI: abdomen  soft Skin: No lesions in the area of chief complaint Neurologic: Sensation intact distally Psychiatric: Patient is competent for consent with normal mood and affect Lymphatic: no lymphedema  MUSCULOSKELETAL: exam stable  Assessment: left tennis elbow  Plan: Plan for Procedure(s): LEFT TENNIS ELBOW RELEASE  The risks benefits and alternatives were discussed with the patient including but not limited to the risks of nonoperative treatment, versus surgical intervention including infection, bleeding, nerve injury,  blood clots, cardiopulmonary complications, morbidity, mortality, among others, and they were willing to proceed.   Glee Arvin, MD 08/07/2023 11:49 AM

## 2023-08-07 NOTE — Anesthesia Procedure Notes (Signed)
 Anesthesia Regional Block: Supraclavicular block   Pre-Anesthetic Checklist: , timeout performed,  Correct Patient, Correct Site, Correct Laterality,  Correct Procedure, Correct Position, site marked,  Risks and benefits discussed,  Surgical consent,  Pre-op evaluation,  At surgeon's request and post-op pain management  Laterality: Left  Prep: chloraprep       Needles:  Injection technique: Single-shot  Needle Type: Echogenic Stimulator Needle     Needle Length: 9cm  Needle Gauge: 21     Additional Needles:   Procedures:,,,, ultrasound used (permanent image in chart),,    Narrative:  Start time: 08/07/2023 12:40 PM End time: 08/07/2023 12:50 PM Injection made incrementally with aspirations every 5 mL.  Performed by: Personally  Anesthesiologist: Leonides Grills, MD  Additional Notes: Functioning IV was confirmed and monitors were applied.  A timeout was performed. Sterile prep, hand hygiene and sterile gloves were used. A 90mm 21ga Arrow echogenic stimulator needle was used. Negative aspiration and negative test dose prior to incremental administration of local anesthetic. The patient tolerated the procedure well.  Ultrasound guidance: relevent anatomy identified, needle position confirmed, local anesthetic spread visualized around nerve(s), vascular puncture avoided.  Image printed for medical record.

## 2023-08-07 NOTE — Op Note (Signed)
   Date of Surgery: 08/07/2023  INDICATIONS: [name] is a 38 y.o.-year-old male with recalcitrant left lateral epicondylitis.  The patient did consent to the procedure after discussion of the risks and benefits.  PREOPERATIVE DIAGNOSIS: Recalcitrant left lateral epicondylitis  POSTOPERATIVE DIAGNOSIS: Same.  PROCEDURE:  Tenotomy and excisional debridement of left elbow ECRB insertion with repair of common extensor tendon. Elbow arthrotomy and synovectomy  SURGEON: Frederick Snyder, M.D.  ASSIST: Frederick Snyder, [place]; necessary for the timely completion of procedure and due to complexity of procedure.  ANESTHESIA:  general, block  IV FLUIDS AND URINE: See anesthesia.  ESTIMATED BLOOD LOSS: minimal mL.  IMPLANTS: Biomet Juggerknot 1.45 mm anchor  DRAINS: None  COMPLICATIONS: see description of procedure.  DESCRIPTION OF PROCEDURE: The patient was brought to the operating room.  The patient had been signed prior to the procedure and this was documented. The patient had the anesthesia placed by the anesthesiologist.  A time-out was performed to confirm that this was the correct patient, site, side and location. The patient did receive antibiotics prior to the incision and was re-dosed during the procedure as needed at indicated intervals.  The patient had the operative extremity prepped and draped in the standard surgical fashion.   Sterile tourniquet was placed on the upper arm and the extremity was exsanguinated with the Esmarch and tourniquet was inflated to 250 mmHg.  Bony landmarks were palpated.  Skin incision was made towards the anterior border of the radiocapitellar joint and the lateral epicondyle.  Dissection was carried down through the subcutaneous tissue.  This was bluntly swept off of the fascia.  The interval between the ECRL and EDC was identified and marked.  This was sharply incised with a 15 blade and the ECRL was bluntly elevated off of the underlying ECRB  tendon with a freer elevator.  Chronic tendinosis of the ECRB tendon was encountered.  This was excisionally debrided down to the capsule.  Debridement of the ECRB was carried out proximally to the the point that it transition to the muscle.  Joint arthrotomy was performed.  The joint was inspected and synovectomy was performed.  There was no evidence of arthritis.  There is no loose bodies.  The lateral collateral ligament complex was intact.  The joint was then thoroughly irrigated.  The bony surface was then excoriated with a rasp and rondure down to bleeding bone.  A 1.45 mm all suture anchor was then placed in the lateral epicondyle.  The common extensor tendon was repaired with the double limb and sutures and a running Krakw fashion.  Watertight closure was achieved.  Surgical site was then thoroughly irrigated and closed in a layered fashion using 0 Vicryl, 3-0 Vicryl, running 4-0 Monocryl and Steri-Strips.  Sterile dressings were applied.  Long-arm splint was placed.  Patient tolerated the procedure well had no immediate complications.  Frederick Snyder was necessary for opening, closing, retracting, limb positioning and overall facilitation and timely completion of the procedure.  POSTOPERATIVE PLAN: Patient will be discharged home.  He is to follow-up in 1 week for splint removal and initiation of elbow range of motion.  Frederick Reel, MD 1:52 PM

## 2023-08-08 ENCOUNTER — Encounter (HOSPITAL_BASED_OUTPATIENT_CLINIC_OR_DEPARTMENT_OTHER): Payer: Self-pay | Admitting: Orthopaedic Surgery

## 2023-08-09 ENCOUNTER — Other Ambulatory Visit: Payer: Self-pay | Admitting: Internal Medicine

## 2023-08-09 DIAGNOSIS — I10 Essential (primary) hypertension: Secondary | ICD-10-CM

## 2023-08-12 ENCOUNTER — Other Ambulatory Visit: Payer: Self-pay

## 2023-08-12 MED ORDER — VALSARTAN 320 MG PO TABS
320.0000 mg | ORAL_TABLET | Freq: Every day | ORAL | 0 refills | Status: DC
  Filled 2023-08-12: qty 30, 30d supply, fill #0
  Filled 2023-09-17: qty 30, 30d supply, fill #1
  Filled 2023-10-16: qty 30, 30d supply, fill #2

## 2023-08-14 ENCOUNTER — Other Ambulatory Visit (HOSPITAL_COMMUNITY): Payer: Self-pay

## 2023-08-14 ENCOUNTER — Ambulatory Visit (INDEPENDENT_AMBULATORY_CARE_PROVIDER_SITE_OTHER): Payer: PRIVATE HEALTH INSURANCE | Admitting: Physician Assistant

## 2023-08-14 DIAGNOSIS — M7712 Lateral epicondylitis, left elbow: Secondary | ICD-10-CM

## 2023-08-14 MED ORDER — ONDANSETRON 4 MG PO TBDP
4.0000 mg | ORAL_TABLET | Freq: Four times a day (QID) | ORAL | 0 refills | Status: DC | PRN
  Filled 2023-08-14: qty 18, 5d supply, fill #0

## 2023-08-14 MED ORDER — OXYCODONE HCL 5 MG PO TABS
5.0000 mg | ORAL_TABLET | Freq: Four times a day (QID) | ORAL | 0 refills | Status: DC | PRN
  Filled 2023-08-14: qty 30, 8d supply, fill #0

## 2023-08-14 MED ORDER — TAMSULOSIN HCL 0.4 MG PO CAPS
0.4000 mg | ORAL_CAPSULE | Freq: Every day | ORAL | 0 refills | Status: DC
Start: 2023-08-14 — End: 2024-02-05
  Filled 2023-08-14: qty 20, 20d supply, fill #0

## 2023-08-14 NOTE — Progress Notes (Signed)
 Post-Op Visit Note   Patient: Frederick Snyder           Date of Birth: September 09, 1985           MRN: [id number] Visit Date: 08/14/2023 PCP: Etta Grandchild, MD   Assessment & Plan:  Chief Complaint:  Chief Complaint  Patient presents with   Left Elbow - Routine Post Op   Visit Diagnoses:  1. Left tennis elbow     Plan: Patient is a pleasant 38 year old who comes in today 1 week status post left tennis elbow release.  He has been doing well.  He is not having any pain that is requiring any medication.  Examination of the left elbow reveals a well-healed surgical incision without complication.  Fingers warm well-perfused.  He is neurovascularly intact distally.  Today, Steri-Strips were applied.  we will apply a Velcro wrist splint for which she will wear for the next 5 weeks.  Will start him in occupational therapy for gentle range of motion of the elbow and wrist.  He will follow-up in 5 weeks when he is 6 weeks out but we will DC the wrist splint and begin strengthening exercises.  Follow-Up Instructions: Return in about 5 weeks (around 09/18/2023).   Orders:  No orders of the defined types were placed in this encounter.  No orders of the defined types were placed in this encounter.   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[facility]) 08/30/2021   Pure hypertriglyceridemia 05/03/2021   OSA on CPAP 05/03/2021   Prediabetes 05/03/2021   Primary hypertension 09/07/2020   Morbid obesity with BMI of 50.0-59.9, adult (HCC) 09/07/2020   Loud snoring 09/07/2020   Encounter for general adult medical examination with abnormal findings 09/07/2020   Vitamin D deficiency disease 09/07/2020   OCD (obsessive compulsive disorder) 04/06/2018   Attention deficit hyperactivity disorder (ADHD) 04/06/2018   Hyperprolactinemia (HCC)  02/26/2018   Asthma 05/25/2009   Past Medical History:  Diagnosis Date   Anemia    Anxiety    Asthma    Depression    GERD (gastroesophageal reflux disease)    hx of   History of kidney stones    Hyperlipidemia    diet controlled - no meds   Hypertension    Migraine    OSA on CPAP 05/03/2021   PONV (postoperative nausea and vomiting)    Vitamin D deficiency     Family History  Problem Relation Age of Onset   Diabetes Mother    Heart disease Mother    Sleep apnea Mother    Diabetes Father    Heart disease Father    Hypertension Father    Sleep apnea Father    Diabetes Other    Hyperlipidemia Other    Hypertension Other    Other Neg Hx        hypogonadism    Past Surgical History:  Procedure Laterality Date   GASTRIC ROUX-EN-Y N/A 05/28/2022   Procedure: LAPAROSCOPIC ROUX-EN-Y GASTRIC BYPASS WITH UPPER ENDOSCOPY;  Surgeon: [name], MD;  Location: [facility];  Service: General;  Laterality: N/A;   INGUINAL HERNIA REPAIR Left 12/08/2020   Procedure: OPEN HERNIA REPAIR INGUINAL ADULT;  Surgeon: Abigail Miyamoto, MD;  Location: [facility] OR;  Service: General;  Laterality: Left;  TAP BLOCK AND LMA.   INSERTION OF MESH Left 12/08/2020  Procedure: INSERTION OF MESH;  Surgeon: Abigail Miyamoto, MD;  Location: [facility] OR;  Service: General;  Laterality: Left;   KNEE SURGERY     TENNIS ELBOW RELEASE/NIRSCHEL PROCEDURE Left 08/07/2023   Procedure: LEFT TENNIS ELBOW RELEASE;  Surgeon: Tarry Kos, MD;  Location: [facility];  Service: Orthopedics;  Laterality: Left;   tubes in ears     UPPER GI ENDOSCOPY N/A 05/28/2022   Procedure: UPPER GI ENDOSCOPY;  Surgeon: [name], MD;  Location: [facility];  Service: General;  Laterality: N/A;   WISDOM TOOTH EXTRACTION     Social History   Occupational History   Occupation: Firefighter parts.   Tobacco Use   Smoking status: Never   Smokeless tobacco: Never   Tobacco comments:    Quit in approx 2007  Vaping Use   Vaping  status: Never Used  Substance and Sexual Activity   Alcohol use: No   Drug use: No   Sexual activity: Yes    Partners: Female

## 2023-08-15 ENCOUNTER — Ambulatory Visit (HOSPITAL_COMMUNITY)
Admission: EM | Admit: 2023-08-15 | Discharge: 2023-08-16 | Disposition: A | Discharge status: Home / Self Care | Payer: PRIVATE HEALTH INSURANCE | Attending: Emergency Medicine | Admitting: Emergency Medicine

## 2023-08-15 ENCOUNTER — Encounter (HOSPITAL_COMMUNITY): Payer: Self-pay | Admitting: Emergency Medicine

## 2023-08-15 DIAGNOSIS — N132 Hydronephrosis with renal and ureteral calculous obstruction: Secondary | ICD-10-CM | POA: Diagnosis present

## 2023-08-15 DIAGNOSIS — Z6841 Body Mass Index (BMI) 40.0 and over, adult: Secondary | ICD-10-CM | POA: Diagnosis not present

## 2023-08-15 DIAGNOSIS — I10 Essential (primary) hypertension: Secondary | ICD-10-CM | POA: Insufficient documentation

## 2023-08-15 DIAGNOSIS — N201 Calculus of ureter: Secondary | ICD-10-CM

## 2023-08-15 DIAGNOSIS — G4733 Obstructive sleep apnea (adult) (pediatric): Secondary | ICD-10-CM | POA: Insufficient documentation

## 2023-08-15 DIAGNOSIS — E66813 Obesity, class 3: Secondary | ICD-10-CM | POA: Insufficient documentation

## 2023-08-15 DIAGNOSIS — F419 Anxiety disorder, unspecified: Secondary | ICD-10-CM | POA: Insufficient documentation

## 2023-08-15 DIAGNOSIS — N12 Tubulo-interstitial nephritis, not specified as acute or chronic: Secondary | ICD-10-CM

## 2023-08-15 DIAGNOSIS — N200 Calculus of kidney: Secondary | ICD-10-CM

## 2023-08-15 LAB — BASIC METABOLIC PANEL
Anion gap: 10 (ref 5–15)
BUN: 14 mg/dL (ref 6–20)
CO2: 22 mmol/L (ref 22–32)
Calcium: 8.6 mg/dL — ABNORMAL LOW (ref 8.9–10.3)
Chloride: 99 mmol/L (ref 98–111)
Creatinine, Ser: 1.46 mg/dL — ABNORMAL HIGH (ref 0.61–1.24)
GFR, Estimated: >60 mL/min (ref >60)
Glucose, Bld: 114 mg/dL — ABNORMAL HIGH (ref 70–99)
Potassium: 3.5 mmol/L (ref 3.5–5.1)
Sodium: 131 mmol/L — ABNORMAL LOW (ref 135–145)

## 2023-08-15 LAB — CBC
HCT: 41.5 % (ref 39.0–52.0)
Hemoglobin: 13.8 g/dL (ref 13.0–17.0)
MCH: 28.8 pg (ref 26.0–34.0)
MCHC: 33.3 g/dL (ref 30.0–36.0)
MCV: 86.5 fL (ref 80.0–100.0)
Platelets: 250 10*3/uL (ref 150–400)
RBC: 4.8 MIL/uL (ref 4.22–5.81)
RDW: 13 % (ref 11.5–15.5)
WBC: 12.3 10*3/uL — ABNORMAL HIGH (ref 4.0–10.5)
nRBC: 0 % (ref 0.0–0.2)

## 2023-08-15 LAB — URINALYSIS, ROUTINE W REFLEX MICROSCOPIC
Bacteria, UA: NONE SEEN
Bilirubin Urine: NEGATIVE
Glucose, UA: NEGATIVE mg/dL
Hgb urine dipstick: NEGATIVE
Ketones, ur: NEGATIVE mg/dL
Leukocytes,Ua: NEGATIVE
Nitrite: NEGATIVE
Protein, ur: 30 mg/dL — AB
Specific Gravity, Urine: 1.03 (ref 1.005–1.030)
pH: 5 (ref 5.0–8.0)

## 2023-08-15 NOTE — ED Triage Notes (Signed)
 38 y/o male comes in c/o on going right sided flank pain since Tuesday. Pt was seen at [facility] and diagnosed with a right sided kidney stone. Pt was sent home on Flomax and Oxycodone and reports no relief.

## 2023-08-16 ENCOUNTER — Emergency Department (HOSPITAL_COMMUNITY): Payer: PRIVATE HEALTH INSURANCE | Admitting: Anesthesiology

## 2023-08-16 ENCOUNTER — Encounter (HOSPITAL_COMMUNITY): Payer: Self-pay | Admitting: *Deleted

## 2023-08-16 ENCOUNTER — Other Ambulatory Visit: Payer: Self-pay | Admitting: Internal Medicine

## 2023-08-16 ENCOUNTER — Emergency Department (HOSPITAL_COMMUNITY): Payer: PRIVATE HEALTH INSURANCE

## 2023-08-16 ENCOUNTER — Other Ambulatory Visit (HOSPITAL_COMMUNITY): Payer: Self-pay

## 2023-08-16 ENCOUNTER — Emergency Department (HOSPITAL_BASED_OUTPATIENT_CLINIC_OR_DEPARTMENT_OTHER): Payer: PRIVATE HEALTH INSURANCE | Admitting: Anesthesiology

## 2023-08-16 ENCOUNTER — Encounter (HOSPITAL_COMMUNITY)
Admission: EM | Disposition: A | Discharge status: Home / Self Care | Payer: Self-pay | Source: Home / Self Care | Attending: Emergency Medicine

## 2023-08-16 DIAGNOSIS — G4733 Obstructive sleep apnea (adult) (pediatric): Secondary | ICD-10-CM

## 2023-08-16 DIAGNOSIS — N201 Calculus of ureter: Secondary | ICD-10-CM | POA: Diagnosis not present

## 2023-08-16 DIAGNOSIS — N132 Hydronephrosis with renal and ureteral calculous obstruction: Secondary | ICD-10-CM | POA: Diagnosis not present

## 2023-08-16 DIAGNOSIS — I10 Essential (primary) hypertension: Secondary | ICD-10-CM | POA: Diagnosis not present

## 2023-08-16 DIAGNOSIS — J45909 Unspecified asthma, uncomplicated: Secondary | ICD-10-CM | POA: Diagnosis not present

## 2023-08-16 HISTORY — PX: CYSTOSCOPY/URETEROSCOPY/HOLMIUM LASER/STENT PLACEMENT: SHX6546

## 2023-08-16 LAB — I-STAT CG4 LACTIC ACID, ED: Lactic Acid, Venous: 1.3 mmol/L (ref 0.5–1.9)

## 2023-08-16 LAB — PROTIME-INR
INR: 1.1 (ref 0.8–1.2)
Prothrombin Time: 14.9 s (ref 11.4–15.2)

## 2023-08-16 LAB — HEPATIC FUNCTION PANEL
ALT: <5 U/L (ref 0–44)
AST: 18 U/L (ref 15–41)
Albumin: 3.8 g/dL (ref 3.5–5.0)
Alkaline Phosphatase: 75 U/L (ref 38–126)
Bilirubin, Direct: 0.3 mg/dL — ABNORMAL HIGH (ref 0.0–0.2)
Indirect Bilirubin: 1.3 mg/dL — ABNORMAL HIGH (ref 0.3–0.9)
Total Bilirubin: 1.6 mg/dL — ABNORMAL HIGH (ref 0.0–1.2)
Total Protein: 7 g/dL (ref 6.5–8.1)

## 2023-08-16 LAB — RESP PANEL BY RT-PCR (RSV, FLU A&B, COVID)  RVPGX2
Influenza A by PCR: NEGATIVE
Influenza B by PCR: NEGATIVE
Resp Syncytial Virus by PCR: NEGATIVE
SARS Coronavirus 2 by RT PCR: NEGATIVE

## 2023-08-16 LAB — APTT: aPTT: 32 s (ref 24–36)

## 2023-08-16 SURGERY — CYSTOSCOPY/URETEROSCOPY/HOLMIUM LASER/STENT PLACEMENT
Anesthesia: General | Site: Ureter | Laterality: Right

## 2023-08-16 MED ORDER — ONDANSETRON HCL 4 MG/2ML IJ SOLN
4.0000 mg | Freq: Once | INTRAMUSCULAR | Status: AC
  Administered 2023-08-16: 4 mg via INTRAVENOUS
  Filled 2023-08-16: qty 2

## 2023-08-16 MED ORDER — ONDANSETRON HCL 4 MG/2ML IJ SOLN
INTRAMUSCULAR | Status: AC
  Filled 2023-08-16: qty 2

## 2023-08-16 MED ORDER — FENTANYL CITRATE (PF) 250 MCG/5ML IJ SOLN
INTRAMUSCULAR | Status: AC
  Filled 2023-08-16: qty 5

## 2023-08-16 MED ORDER — ACETAMINOPHEN 10 MG/ML IV SOLN: 1000.0000 mg | Freq: Once | INTRAVENOUS | Status: DC | PRN

## 2023-08-16 MED ORDER — LIDOCAINE HCL (PF) 2 % IJ SOLN
INTRAMUSCULAR | Status: AC
  Filled 2023-08-16: qty 5

## 2023-08-16 MED ORDER — OXYCODONE HCL 5 MG/5ML PO SOLN: 5.0000 mg | Freq: Once | ORAL | Status: DC | PRN

## 2023-08-16 MED ORDER — OXYCODONE HCL 5 MG PO TABS: 5.0000 mg | ORAL_TABLET | Freq: Once | ORAL | Status: DC | PRN

## 2023-08-16 MED ORDER — CIPROFLOXACIN IN D5W 400 MG/200ML IV SOLN
400.0000 mg | Freq: Two times a day (BID) | INTRAVENOUS | Status: DC
  Administered 2023-08-16: 400 mg via INTRAVENOUS
  Filled 2023-08-16 (×2): qty 200

## 2023-08-16 MED ORDER — SODIUM CHLORIDE 0.9 % IR SOLN
Status: DC | PRN
  Administered 2023-08-16: 3000 mL via INTRAVESICAL

## 2023-08-16 MED ORDER — MORPHINE SULFATE (PF) 4 MG/ML IV SOLN: 4.0000 mg | Freq: Once | INTRAVENOUS | Status: DC

## 2023-08-16 MED ORDER — IOHEXOL 300 MG/ML  SOLN
INTRAMUSCULAR | Status: DC | PRN
  Administered 2023-08-16: 2 mL

## 2023-08-16 MED ORDER — SODIUM CHLORIDE 0.9 % IV SOLN
2.0000 g | Freq: Once | INTRAVENOUS | Status: AC
  Administered 2023-08-16: 2 g via INTRAVENOUS
  Filled 2023-08-16: qty 20

## 2023-08-16 MED ORDER — PROPOFOL 10 MG/ML IV BOLUS
INTRAVENOUS | Status: AC
  Filled 2023-08-16: qty 20

## 2023-08-16 MED ORDER — ACETAMINOPHEN 325 MG PO TABS
650.0000 mg | ORAL_TABLET | Freq: Once | ORAL | Status: AC
Start: 2023-08-16 — End: 2023-08-16
  Administered 2023-08-16: 650 mg via ORAL
  Filled 2023-08-16: qty 2

## 2023-08-16 MED ORDER — MORPHINE SULFATE (PF) 4 MG/ML IV SOLN
4.0000 mg | Freq: Once | INTRAVENOUS | Status: AC
  Administered 2023-08-16: 4 mg via INTRAVENOUS
  Filled 2023-08-16: qty 1

## 2023-08-16 MED ORDER — SCOPOLAMINE 1 MG/3DAYS TD PT72
1.0000 | MEDICATED_PATCH | TRANSDERMAL | Status: DC
  Administered 2023-08-16: 1.5 mg via TRANSDERMAL
  Filled 2023-08-16: qty 1

## 2023-08-16 MED ORDER — HYDROMORPHONE HCL 1 MG/ML IJ SOLN: 0.2500 mg | INTRAMUSCULAR | Status: DC | PRN

## 2023-08-16 MED ORDER — IOHEXOL 300 MG/ML  SOLN
100.0000 mL | Freq: Once | INTRAMUSCULAR | Status: AC | PRN
  Administered 2023-08-16: 100 mL via INTRAVENOUS

## 2023-08-16 MED ORDER — CEPHALEXIN 500 MG PO CAPS
500.0000 mg | ORAL_CAPSULE | Freq: Three times a day (TID) | ORAL | 0 refills | Status: DC
  Filled 2023-08-16: qty 21, 7d supply, fill #0

## 2023-08-16 MED ORDER — LACTATED RINGERS IV SOLN: INTRAVENOUS | Status: DC

## 2023-08-16 MED ORDER — MIDAZOLAM HCL 5 MG/5ML IJ SOLN
INTRAMUSCULAR | Status: DC | PRN
  Administered 2023-08-16: 1 mg via INTRAVENOUS

## 2023-08-16 MED ORDER — DEXAMETHASONE SODIUM PHOSPHATE 10 MG/ML IJ SOLN
INTRAMUSCULAR | Status: DC | PRN
Start: 2023-08-16 — End: 2023-08-16
  Administered 2023-08-16: 4 mg via INTRAVENOUS

## 2023-08-16 MED ORDER — DEXAMETHASONE SODIUM PHOSPHATE 10 MG/ML IJ SOLN
INTRAMUSCULAR | Status: AC
  Filled 2023-08-16: qty 1

## 2023-08-16 MED ORDER — ONDANSETRON HCL 4 MG/2ML IJ SOLN: 4.0000 mg | Freq: Once | INTRAMUSCULAR | Status: DC | PRN

## 2023-08-16 MED ORDER — AMISULPRIDE (ANTIEMETIC) 5 MG/2ML IV SOLN: 10.0000 mg | Freq: Once | INTRAVENOUS | Status: DC | PRN

## 2023-08-16 MED ORDER — LACTATED RINGERS IV SOLN
INTRAVENOUS | Status: DC
Start: 2023-08-16 — End: 2023-08-16

## 2023-08-16 MED ORDER — FENTANYL CITRATE (PF) 100 MCG/2ML IJ SOLN
INTRAMUSCULAR | Status: DC | PRN
Start: 2023-08-16 — End: 2023-08-16
  Administered 2023-08-16: 50 ug via INTRAVENOUS
  Administered 2023-08-16: 75 ug via INTRAVENOUS
  Administered 2023-08-16: 25 ug via INTRAVENOUS
  Administered 2023-08-16: 50 ug via INTRAVENOUS

## 2023-08-16 MED ORDER — LACTATED RINGERS IV BOLUS (SEPSIS)
1000.0000 mL | Freq: Once | INTRAVENOUS | Status: AC
  Administered 2023-08-16: 1000 mL via INTRAVENOUS

## 2023-08-16 MED ORDER — CHLORHEXIDINE GLUCONATE 0.12 % MT SOLN
15.0000 mL | Freq: Once | OROMUCOSAL | Status: AC
  Administered 2023-08-16: 15 mL via OROMUCOSAL

## 2023-08-16 MED ORDER — DEXMEDETOMIDINE HCL IN NACL 80 MCG/20ML IV SOLN
INTRAVENOUS | Status: DC | PRN
Start: 2023-08-16 — End: 2023-08-16
  Administered 2023-08-16: 8 ug via INTRAVENOUS

## 2023-08-16 MED ORDER — MIDAZOLAM HCL 2 MG/2ML IJ SOLN
INTRAMUSCULAR | Status: AC
  Filled 2023-08-16: qty 2

## 2023-08-16 MED ORDER — PROPOFOL 10 MG/ML IV BOLUS
INTRAVENOUS | Status: DC | PRN
Start: 2023-08-16 — End: 2023-08-16
  Administered 2023-08-16: 200 mg via INTRAVENOUS

## 2023-08-16 SURGICAL SUPPLY — 18 items
BAG URO CATCHER STRL LF (MISCELLANEOUS) ×1 IMPLANT
BASKET ZERO TIP NITINOL 2.4FR (BASKET) IMPLANT
CATH URETL OPEN END 6FR 70 (CATHETERS) ×1 IMPLANT
CLOTH BEACON ORANGE TIMEOUT ST (SAFETY) ×1 IMPLANT
FIBER LASER MOSES 200 DFL (Laser) IMPLANT
FIBER LASER MOSES 365 DFL (Laser) IMPLANT
GLOVE BIO SURGEON STRL SZ7.5 (GLOVE) ×1 IMPLANT
GOWN STRL REUS W/ TWL XL LVL3 (GOWN DISPOSABLE) ×1 IMPLANT
GUIDEWIRE STR DUAL SENSOR (WIRE) ×1 IMPLANT
GUIDEWIRE ZIPWRE .038 STRAIGHT (WIRE) IMPLANT
KIT TURNOVER KIT A (KITS) IMPLANT
MANIFOLD NEPTUNE II (INSTRUMENTS) ×1 IMPLANT
PACK CYSTO (CUSTOM PROCEDURE TRAY) ×1 IMPLANT
SHEATH NAVIGATOR HD 11/13X28 (SHEATH) IMPLANT
SHEATH NAVIGATOR HD 11/13X36 (SHEATH) IMPLANT
STENT URET 6FRX26 CONTOUR (STENTS) IMPLANT
TUBING CONNECTING 10 (TUBING) ×1 IMPLANT
TUBING UROLOGY SET (TUBING) ×1 IMPLANT

## 2023-08-16 NOTE — Transfer of Care (Signed)
 Immediate Anesthesia Transfer of Care Note  Patient: Frederick Snyder  Procedure(s) Performed: CYSTOSCOPY; RIGHT URETEROSCOPY; BASKET STONE EXTRACTION RIGHT URETERAL STENT PLACEMENT (Right: Ureter)  Patient Location: PACU  Anesthesia Type:General  Level of Consciousness: awake, oriented, and patient cooperative  Airway & Oxygen Therapy: Patient Spontanous Breathing and Patient connected to face mask oxygen  Post-op Assessment: Report given to RN and Post -op Vital signs reviewed and stable  Post vital signs: Reviewed and stable  Last Vitals:  Vitals Value Taken Time  BP 112/75 08/16/23 0957  Temp    Pulse 85 08/16/23 1000  Resp 21 08/16/23 0959  SpO2 99 % 08/16/23 1000  Vitals shown include unfiled device data.  Last Pain:  Vitals:   08/16/23 0816  TempSrc:   PainSc: 7       Patients Stated Pain Goal: 0 (08/16/23 0816)  Complications: No notable events documented.

## 2023-08-16 NOTE — ED Provider Notes (Signed)
 [place] EMERGENCY DEPARTMENT AT [facility] Provider Note   CSN: [id number] Arrival date & time: 08/15/23  2245     History  Chief Complaint  Patient presents with   Flank Pain    Frederick Snyder is a 38 y.o. male history of kidney stones, hypertension, asthma presented for right-sided flank pain since Tuesday.  Patient went to [facility] in which she was told he has a stone but is not sure how big the stone is.  Patient was given oxycodone along with tamsulosin however states that his pain is not controlled by the pain meds.  Patient states he is not urinating as much either.  Patient is unsure of fevers.  Patient had nausea vomiting since being discharged and is afraid that he has become dehydrated.  Patient states that the last time he had was 12 years ago and passed on its own.   Home Medications Prior to Admission medications   Medication Sig Start Date End Date Taking? Authorizing Provider  amphetamine-dextroamphetamine (ADDERALL) 30 MG tablet Take 1 tablet by mouth 2 (two) times daily. 07/30/23   Avelina Laine A, NP  CALCIUM PO Take 1 tablet by mouth daily.    [provider]  HYDROcodone-acetaminophen (NORCO) 5-325 MG tablet Take 1 tablet by mouth 3 (three) times daily as needed. To be taken after surgery 07/18/23   Cristie Hem, PA-C  Multiple Vitamin (MULTIVITAMIN) tablet Take 1 tablet by mouth daily.    [provider]  ondansetron (ZOFRAN) 4 MG tablet Take 1 tablet (4 mg total) by mouth every 8 (eight) hours as needed for nausea or vomiting. 07/18/23   Cristie Hem, PA-C  ondansetron (ZOFRAN-ODT) 4 MG disintegrating tablet Dissolve 1 tablet (4 mg total) by mouth every 6 (six) hours as needed for nausea/vomiting. 08/14/23     oxyCODONE (OXY IR/ROXICODONE) 5 MG immediate release tablet Take 1 tablet (5 mg total) by mouth every 6 (six) hours as needed. 08/14/23     sertraline (ZOLOFT) 100 MG tablet Take 1 tablet (100 mg total) by mouth  daily. 05/30/23   Joan Flores, NP  tamsulosin (FLOMAX) 0.4 MG CAPS capsule Take 1 capsule (0.4 mg total) by mouth daily. 08/14/23     valsartan (DIOVAN) 320 MG tablet Take 1 tablet (320 mg total) by mouth daily. 08/12/23   Etta Grandchild, MD      Allergies    Nitrous oxide and Nsaids    Review of Systems   Review of Systems  Genitourinary:  Positive for flank pain.    Physical Exam Updated Vital Signs BP 115/61   Pulse (!) 53   Temp 98.1 F (36.7 C) (Oral)   Resp (!) 22   Ht 6' (1.829 m)   Wt 136.1 kg   SpO2 96%   BMI 40.69 kg/m  Physical Exam Vitals reviewed.  Constitutional:      General: He is in acute distress.  HENT:     Head: Normocephalic and atraumatic.  Eyes:     Extraocular Movements: Extraocular movements intact.     Conjunctiva/sclera: Conjunctivae normal.     Pupils: Pupils are equal, round, and reactive to light.  Cardiovascular:     Rate and Rhythm: Normal rate and regular rhythm.     Pulses: Normal pulses.     Heart sounds: Normal heart sounds.     Comments: 2+ bilateral radial/dorsalis pedis pulses with regular rate Pulmonary:     Effort: Pulmonary effort is normal. No respiratory  distress.     Breath sounds: Normal breath sounds.  Abdominal:     Palpations: Abdomen is soft.     Tenderness: There is no abdominal tenderness. There is right CVA tenderness. There is no left CVA tenderness, guarding or rebound.  Musculoskeletal:        General: Normal range of motion.     Cervical back: Normal range of motion and neck supple.     Comments: 5 out of 5 bilateral grip/leg extension strength  Skin:    General: Skin is warm and dry.     Capillary Refill: Capillary refill takes less than 2 seconds.  Neurological:     General: No focal deficit present.     Mental Status: He is alert and oriented to person, place, and time.     Comments: Sensation intact in all 4 limbs  Psychiatric:        Mood and Affect: Mood normal.     ED Results / Procedures /  Treatments   Labs (all labs ordered are listed, but only abnormal results are displayed) Labs Reviewed  CBC - Abnormal; Notable for the following components:      Result Value   WBC 12.3 (*)    All other components within normal limits  BASIC METABOLIC PANEL - Abnormal; Notable for the following components:   Sodium 131 (*)    Glucose, Bld 114 (*)    Creatinine, Ser 1.46 (*)    Calcium 8.6 (*)    All other components within normal limits  URINALYSIS, ROUTINE W REFLEX MICROSCOPIC - Abnormal; Notable for the following components:   Color, Urine AMBER (*)    Protein, ur 30 (*)    All other components within normal limits  HEPATIC FUNCTION PANEL - Abnormal; Notable for the following components:   Total Bilirubin 1.6 (*)    Bilirubin, Direct 0.3 (*)    Indirect Bilirubin 1.3 (*)    All other components within normal limits  RESP PANEL BY RT-PCR (RSV, FLU A&B, COVID)  RVPGX2  CULTURE, BLOOD (ROUTINE X 2)  CULTURE, BLOOD (ROUTINE X 2)  PROTIME-INR  APTT  I-STAT CG4 LACTIC ACID, ED    EKG EKG Interpretation Date/Time:  Friday August 16 2023 00:52:03 EST Ventricular Rate:  76 PR Interval:  133 QRS Duration:  99 QT Interval:  370 QTC Calculation: 416 R Axis:   45  Text Interpretation: Sinus rhythm Confirmed by Drema Pry [phone]) on 08/16/2023 4:35:57 AM  Radiology CT ABDOMEN PELVIS W CONTRAST Result Date: 08/16/2023 CLINICAL DATA:  Pyelonephritis is suspected but the patient has a history of renal stones and obstruction. There is right flank pain and fever. Leukocytosis. Prior bariatric surgery with Roux-en-Y, prior left inguinal hernia repair. EXAM: CT ABDOMEN AND PELVIS WITH CONTRAST TECHNIQUE: Multidetector CT imaging of the abdomen and pelvis was performed using the standard protocol following bolus administration of intravenous contrast. RADIATION DOSE REDUCTION: This exam was performed according to the departmental dose-optimization program which includes automated exposure  control, adjustment of the mA and/or kV according to patient size and/or use of iterative reconstruction technique. CONTRAST:  OMNIPAQUE IOHEXOL 300 MG/ML  SOLN COMPARISON:  Flat plate abdomen film today, recent CT without contrast 08/13/2023, CT abdomen pelvis with IV contrast 01/05/2023. FINDINGS: Lower chest: Linear scarring or atelectasis right lower lobe. No focal lung base infiltrate. Normal cardiac size. No pericardial effusion. Small sliding hiatus hernia. Hepatobiliary: 1 cm cyst again noted in hepatic segment 6, Hounsfield density is 15. There is no  mass enhancement of the liver. The liver is 21.5 cm in length and moderately steatotic. The gallbladder and bile ducts are unremarkable. Pancreas: No abnormality Spleen: No focal abnormality or mass. Slight splenomegaly with splenic length 14.3 cm. Adrenals/Urinary Tract: There is no adrenal mass. There is mild-to-moderate interval worsened right hydroureteronephrosis above a 1 mm distal ureteral stone just under 1 cm from the UVJ. The position of the stone has not changed since 3 days ago. Small amount of fluid is developed underneath the right kidney which could be due to the obstructive uropathy or could indicate an early caliceal leak. No intrarenal stones or other ureteral stones are seen. There is asymmetric right cortical contrast retention and faint hypoenhancement over portions of the right renal cortex, the latter concerning for pyelonephritis. There is no mass enhancement of either kidney. The bladder is unremarkable. 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 Left upper to mid abdominal small bowel anastomoses. No dilatation or wall thickening. Mobile cecum with normal appendix. Moderate retained stool ascending colon and cecum. Vascular/Lymphatic: Aortic atherosclerosis. No enlarged abdominal or pelvic lymph nodes. Reproductive: Prostate is unremarkable. Other: None. Musculoskeletal: No acute or significant osseous findings. IMPRESSION: 1.  Mild-to-moderate interval worsened right hydroureteronephrosis above a 1 mm distal ureteral stone just under 1 cm from the UVJ. The position of the stone has not changed since 3 days ago. 2. Small amount of fluid developed underneath the right kidney which could be due to the obstructive uropathy or could indicate an early caliceal leak. 3. Asymmetric right cortical contrast retention and faint hypoenhancement over portions of the right renal cortex, the latter concerning for pyelonephritis. 4. Hepatosplenomegaly with moderate hepatic steatosis. 5. Aortic atherosclerosis. 6. 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 7. Small sliding hiatus hernia. Aortic Atherosclerosis (ICD10-I70.0). Electronically Signed   By: Almira Bar M.D.   On: 08/16/2023 07:03   DG Abdomen 1 View Result Date: 08/16/2023 CLINICAL DATA:  History of right ureteral calculus EXAM: ABDOMEN - 1 VIEW COMPARISON:  08/13/2023 FINDINGS: Scattered large and small bowel gas is noted. The previously described distal right ureteral stone is not well appreciated on this exam. No bony abnormality is seen. IMPRESSION: No acute abnormality noted. Electronically Signed   By: Alcide Clever M.D.   On: 08/16/2023 03:34   DG Chest Port 1 View Result Date: 08/16/2023 CLINICAL DATA:  Possible sepsis EXAM: PORTABLE CHEST 1 VIEW COMPARISON:  11/13/2021 FINDINGS: Cardiac shadow is within normal limits. Lungs are well aerated bilaterally. No bony abnormality is seen. IMPRESSION: No active disease. Electronically Signed   By: Alcide Clever M.D.   On: 08/16/2023 03:33    Procedures .Critical Care  Performed by: Netta Corrigan, PA-C Authorized by: Netta Corrigan, PA-C   Critical care provider statement:    Critical care time (minutes):  40   Critical care time was exclusive of:  Separately billable procedures and treating other patients   Critical care was necessary to treat or prevent imminent or life-threatening deterioration of the following  conditions:  Sepsis   Critical care was time spent personally by me on the following activities:  Blood draw for specimens, development of treatment plan with patient or surrogate, discussions with consultants, evaluation of patient's response to treatment, examination of patient, obtaining history from patient or surrogate, review of old charts, re-evaluation of patient's condition, pulse oximetry, ordering and review of radiographic studies, ordering and review of laboratory studies and ordering and performing treatments and interventions   I assumed direction of critical care  for this patient from another provider in my specialty: no     Care discussed with: admitting provider       Medications Ordered in ED Medications  lactated ringers infusion ( Intravenous New Bag/Given 08/16/23 0123)  lactated ringers bolus 1,000 mL (0 mLs Intravenous Stopped 08/16/23 0235)  cefTRIAXone (ROCEPHIN) 2 g in sodium chloride 0.9 % 100 mL IVPB (0 g Intravenous Stopped 08/16/23 0133)  morphine (PF) 4 MG/ML injection 4 mg (4 mg Intravenous Given 08/16/23 0054)  ondansetron (ZOFRAN) injection 4 mg (4 mg Intravenous Given 08/16/23 0054)  acetaminophen (TYLENOL) tablet 650 mg (650 mg Oral Given 08/16/23 0056)  iohexol (OMNIPAQUE) 300 MG/ML solution 100 mL (100 mLs Intravenous Contrast Given 08/16/23 0359)  morphine (PF) 4 MG/ML injection 4 mg (4 mg Intravenous Given 08/16/23 0454)    ED Course/ Medical Decision Making/ A&P                                 Medical Decision Making Amount and/or Complexity of Data Reviewed Labs: ordered. Radiology: ordered.  Risk OTC drugs. Prescription drug management.   Chapman Matteucci Early 38 y.o. presented today for sepsis.  Working DDx that I considered at this time includes, but not limited to, sepsis, nephrolithiasis, bacteremia, UTI, pneumonia, meningitis/encephalitis, cellulitis, ACS, myocarditis, acidosis, dehydration, electrolyte abnormalities.  R/o DDx: bacteremia, UTI,  pneumonia, meningitis/encephalitis, cellulitis, ACS, myocarditis, acidosis, dehydration, electrolyte abnormalities: These are considered less likely due to history of present illness, physical exam, labs/imaging findings.  Review of prior external notes: 08/14/2023 office visit  Unique Tests and My Independent Interpretation: CBC: Leukocytosis 12.3 BMP: Creatinine 1.46 which is up from 0.8 which is patient's baseline Lactic acid: Unremarkable Lipase: Unremarkable UA: Unremarkable Chest x-ray: No acute findings Abdominal x-ray: No acute findings CT abdomen pelvis with contrast: Pending aPTT: Unremarkable PT/INR: Unremarkable Blood cultures: Pending Respiratory panel: negative EKG: Sinus 76 bpm, no ST elevations or depressions  Social Determinants of Health: none  Discussion with Independent Historian: None  Discussion of Management of Tests:  [place], MD Urology  Risk: High: Hospitalization  EMERGENCY DEPARTMENT US RENAL EXAM  "Study: Limited Retroperitoneal Ultrasound of Kidneys"  INDICATIONS: Flank pain Long and short axis of both kidneys were obtained.   PERFORMED BY: Myself IMAGES ARCHIVED?: Yes LIMITATIONS: Body habitus VIEWS USED: Long axis and Short axis  INTERPRETATION: Right Hydronephrosis moderate   Risk Stratification Score: None Plan: On exam patient was septic on arrival.  On exam patient has right CVA tenderness and is febrile in the room 100.4 F and does have white count based on triage labs.  Bedside ultrasound does show moderate hydronephrosis on the right side.  Concern the patient has a septic stone and so sepsis protocol was initiated.  I was unable to find the records from [facility] and patient states he does not have the records with him that would show that the CT scan results.  Will consult urology to discuss imaging and stone management.  The cardiac monitor was ordered secondary to the patient's history of sepsis and to monitor the patient  for dysrhythmia. Cardiac monitor by my independent interpretation showed normal sinus.  I spoke to urology and they stated to get a KUB and will likely need hospitalist admission and that they will see the patient in the morning.  Initial imaging did not show a stone and so CT was ordered.  At time of signout I called the urologist again  and urology states that they will come down to see the patient as they had a cancellation as they may take patient to the OR. Laveda Norman, PA-C was made aware of this development is he is now assuming care.  The plan going forward is to monitor the patient till urology can see him.  At time of signout the CT had not yet resulted.  This chart was dictated using voice recognition software.  Despite best efforts to proofread,  errors can occur which can change the documentation meaning.         Final Clinical Impression(s) / ED Diagnoses Final diagnoses:  None    Rx / DC Orders ED Discharge Orders     None         Remi Deter 08/16/23 0708    Nira Conn, MD 08/16/23 [phone]

## 2023-08-16 NOTE — H&P (Addendum)
 Consultation: Right ureteral stone, right hydronephrosis Requested by: Dr. Drema Pry  History of Present Illness: Frederick Snyder is a 38 year old male with a history of right flank pain.  He was diagnosed with a right ureteral stone and discharged from outside emergency room.  He developed severe right flank pain last night and presented to [facility].  He had a temp of 100.5 but has since been afebrile.  No dysuria or gross hematuria.  Bedside ultrasound was done which was reported as moderate right hydronephrosis.  KUB was equivocal.  He underwent a CT scan which showed moderate right hydronephrosis and right perinephric stranding down to a 1 mm stone and/or proteinaceous debris at the right UVJ.  This morning patient continues to have nausea and right flank pain.  He has not seen a stone or debris pass.  No chills.  He reports a prior history of stone but no operative intervention required.  Past Medical History:  Diagnosis Date   Anemia    Anxiety    Asthma    Depression    GERD (gastroesophageal reflux disease)    hx of   History of kidney stones    Hyperlipidemia    diet controlled - no meds   Hypertension    Migraine    OSA on CPAP 05/03/2021   PONV (postoperative nausea and vomiting)    Vitamin D deficiency    Past Surgical History:  Procedure Laterality Date   GASTRIC ROUX-EN-Y N/A 05/28/2022   Procedure: LAPAROSCOPIC ROUX-EN-Y GASTRIC BYPASS WITH UPPER ENDOSCOPY;  Surgeon: [name], MD;  Location: [facility];  Service: General;  Laterality: N/A;   INGUINAL HERNIA REPAIR Left 12/08/2020   Procedure: OPEN HERNIA REPAIR INGUINAL ADULT;  Surgeon: Abigail Miyamoto, MD;  Location: [facility];  Service: General;  Laterality: Left;  TAP BLOCK AND LMA.   INSERTION OF MESH Left 12/08/2020   Procedure: INSERTION OF MESH;  Surgeon: Abigail Miyamoto, MD;  Location: [facility] OR;  Service: General;  Laterality: Left;   KNEE SURGERY     TENNIS ELBOW RELEASE/NIRSCHEL PROCEDURE Left 08/07/2023    Procedure: LEFT TENNIS ELBOW RELEASE;  Surgeon: Tarry Kos, MD;  Location: [facility];  Service: Orthopedics;  Laterality: Left;   tubes in ears     UPPER GI ENDOSCOPY N/A 05/28/2022   Procedure: UPPER GI ENDOSCOPY;  Surgeon: [name], MD;  Location: [facility];  Service: General;  Laterality: N/A;   WISDOM TOOTH EXTRACTION      Home Medications:  (Not in a hospital admission)  Allergies:  Allergies  Allergen Reactions   Nitrous Oxide Nausea And Vomiting   Nsaids     D/t history of gastric bypass    Family History  Problem Relation Age of Onset   Diabetes Mother    Heart disease Mother    Sleep apnea Mother    Diabetes Father    Heart disease Father    Hypertension Father    Sleep apnea Father    Diabetes Other    Hyperlipidemia Other    Hypertension Other    Other Neg Hx        hypogonadism   Social History:  reports that he has never smoked. He has never used smokeless tobacco. He reports that he does not drink alcohol and does not use drugs.  ROS: A complete review of systems was performed.  All systems are negative except for pertinent findings as noted. ROS   Physical Exam:  Vital signs in last 24 hours: Temp:  [  97.8 F (36.6 C)-100.4 F (38 C)] 98.1 F (36.7 C) (03/07 0627) Pulse Rate:  [53-73] 53 (03/07 0615) Resp:  [14-26] 22 (03/07 0615) BP: (115-156)/(55-96) 115/61 (03/07 0615) SpO2:  [96 %-100 %] 96 % (03/07 0615) Weight:  [136.1 kg] 136.1 kg (03/06 2303) General:  Alert and oriented, No acute distress HEENT: Normocephalic, atraumatic Cardiovascular: Regular rate and rhythm Lungs: Regular rate and effort Abdomen: Soft, nontender, nondistended, no abdominal masses Back: No CVA tenderness Extremities: No edema Neurologic: Grossly intact  Laboratory Data:  Results for orders placed or performed during the hospital encounter of 08/15/23 (from the past 24 hours)  CBC     Status: Abnormal   Collection Time: 08/15/23 11:25 PM   Result Value Ref Range   WBC 12.3 (H) 4.0 - 10.5 K/uL   RBC 4.80 4.22 - 5.81 MIL/uL   Hemoglobin 13.8 13.0 - 17.0 g/dL   HCT 16.1 09.6 - 04.5 %   MCV 86.5 80.0 - 100.0 fL   MCH 28.8 26.0 - 34.0 pg   MCHC 33.3 30.0 - 36.0 g/dL   RDW 40.9 81.1 - 91.4 %   Platelets 250 150 - 400 K/uL   nRBC 0.0 0.0 - 0.2 %  Basic metabolic panel     Status: Abnormal   Collection Time: 08/15/23 11:25 PM  Result Value Ref Range   Sodium 131 (L) 135 - 145 mmol/L   Potassium 3.5 3.5 - 5.1 mmol/L   Chloride 99 98 - 111 mmol/L   CO2 22 22 - 32 mmol/L   Glucose, Bld 114 (H) 70 - 99 mg/dL   BUN 14 6 - 20 mg/dL   Creatinine, Ser 7.82 (H) 0.61 - 1.24 mg/dL   Calcium 8.6 (L) 8.9 - 10.3 mg/dL   GFR, Estimated >95 >62 mL/min   Anion gap 10 5 - 15  Urinalysis, Routine w reflex microscopic -Urine, Clean Catch     Status: Abnormal   Collection Time: 08/15/23 11:38 PM  Result Value Ref Range   Color, Urine AMBER (A) YELLOW   APPearance CLEAR CLEAR   Specific Gravity, Urine 1.030 1.005 - 1.030   pH 5.0 5.0 - 8.0   Glucose, UA NEGATIVE NEGATIVE mg/dL   Hgb urine dipstick NEGATIVE NEGATIVE   Bilirubin Urine NEGATIVE NEGATIVE   Ketones, ur NEGATIVE NEGATIVE mg/dL   Protein, ur 30 (A) NEGATIVE mg/dL   Nitrite NEGATIVE NEGATIVE   Leukocytes,Ua NEGATIVE NEGATIVE   RBC / HPF 0-5 0 - 5 RBC/hpf   WBC, UA 0-5 0 - 5 WBC/hpf   Bacteria, UA NONE SEEN NONE SEEN   Squamous Epithelial / HPF 0-5 0 - 5 /HPF   Mucus PRESENT   Protime-INR     Status: None   Collection Time: 08/16/23 12:49 AM  Result Value Ref Range   Prothrombin Time 14.9 11.4 - 15.2 seconds   INR 1.1 0.8 - 1.2  APTT     Status: None   Collection Time: 08/16/23 12:49 AM  Result Value Ref Range   aPTT 32 24 - 36 seconds  Hepatic function panel     Status: Abnormal   Collection Time: 08/16/23 12:49 AM  Result Value Ref Range   Total Protein 7.0 6.5 - 8.1 g/dL   Albumin 3.8 3.5 - 5.0 g/dL   AST 18 15 - 41 U/L   ALT <5 0 - 44 U/L   Alkaline  Phosphatase 75 38 - 126 U/L   Total Bilirubin 1.6 (H) 0.0 - 1.2 mg/dL  Bilirubin, Direct 0.3 (H) 0.0 - 0.2 mg/dL   Indirect Bilirubin 1.3 (H) 0.3 - 0.9 mg/dL  I-Stat Lactic Acid, ED     Status: None   Collection Time: 08/16/23 12:54 AM  Result Value Ref Range   Lactic Acid, Venous 1.3 0.5 - 1.9 mmol/L  Resp panel by RT-PCR (RSV, Flu A&B, Covid) Anterior Nasal Swab     Status: None   Collection Time: 08/16/23  1:31 AM   Specimen: Anterior Nasal Swab  Result Value Ref Range   SARS Coronavirus 2 by RT PCR NEGATIVE NEGATIVE   Influenza A by PCR NEGATIVE NEGATIVE   Influenza B by PCR NEGATIVE NEGATIVE   Resp Syncytial Virus by PCR NEGATIVE NEGATIVE   Recent Results (from the past 240 hours)  Resp panel by RT-PCR (RSV, Flu A&B, Covid) Anterior Nasal Swab     Status: None   Collection Time: 08/16/23  1:31 AM   Specimen: Anterior Nasal Swab  Result Value Ref Range Status   SARS Coronavirus 2 by RT PCR NEGATIVE NEGATIVE Final    Comment: (NOTE) SARS-CoV-2 target nucleic acids are NOT DETECTED.  The SARS-CoV-2 RNA is generally detectable in upper respiratory specimens during the acute phase of infection. The lowest concentration of SARS-CoV-2 viral copies this assay can detect is 138 copies/mL. A negative result does not preclude SARS-Cov-2 infection and should not be used as the sole basis for treatment or other patient management decisions. A negative result may occur with  improper specimen collection/handling, submission of specimen other than nasopharyngeal swab, presence of viral mutation(s) within the areas targeted by this assay, and inadequate number of viral copies(<138 copies/mL). A negative result must be combined with clinical observations, patient history, and epidemiological information. The expected result is Negative.  Fact Sheet for Patients:  BloggerCourse.com  Fact Sheet for Healthcare Providers:   SeriousBroker.it  This test is no t yet approved or cleared by the [place] FDA and  has been authorized for detection and/or diagnosis of SARS-CoV-2 by FDA under an Emergency Use Authorization (EUA). This EUA will remain  in effect (meaning this test can be used) for the duration of the COVID-19 declaration under Section 564(b)(1) of the Act, 21 U.S.C.section 360bbb-3(b)(1), unless the authorization is terminated  or revoked sooner.       Influenza A by PCR NEGATIVE NEGATIVE Final   Influenza B by PCR NEGATIVE NEGATIVE Final    Comment: (NOTE) The Xpert Xpress SARS-CoV-2/FLU/RSV plus assay is intended as an aid in the diagnosis of influenza from Nasopharyngeal swab specimens and should not be used as a sole basis for treatment. Nasal washings and aspirates are unacceptable for Xpert Xpress SARS-CoV-2/FLU/RSV testing.  Fact Sheet for Patients: BloggerCourse.com  Fact Sheet for Healthcare Providers: SeriousBroker.it  This test is not yet approved or cleared by the [place] FDA and has been authorized for detection and/or diagnosis of SARS-CoV-2 by FDA under an Emergency Use Authorization (EUA). This EUA will remain in effect (meaning this test can be used) for the duration of the COVID-19 declaration under Section 564(b)(1) of the Act, 21 U.S.C. section 360bbb-3(b)(1), unless the authorization is terminated or revoked.     Resp Syncytial Virus by PCR NEGATIVE NEGATIVE Final    Comment: (NOTE) Fact Sheet for Patients: BloggerCourse.com  Fact Sheet for Healthcare Providers: SeriousBroker.it  This test is not yet approved or cleared by the [place] FDA and has been authorized for detection and/or diagnosis of SARS-CoV-2 by FDA under an Emergency Use Authorization (  EUA). This EUA will remain in effect (meaning this test can be used) for  the duration of the COVID-19 declaration under Section 564(b)(1) of the Act, 21 U.S.C. section 360bbb-3(b)(1), unless the authorization is terminated or revoked.  Performed at [facility], [address], [place], [place] [address]    Creatinine: Recent Labs    08/15/23 2325  CREATININE 1.46*    Impression/Assessment:  Small right ureteral stone or debris causing moderate right hydronephrosis and perinephric stranding-  Plan:  I reviewed the CT images with Molli Hazard.  This is a small stone but it is causing significant obstruction.  We went over the nature risk and benefits of continued stone passage.  I told him I do anticipate this stone passing at some point.  It is close.  We also discussed the patient the nature, potential benefits, risks and alternatives to operative intervention with cystoscopy, right retrograde pyelogram, right ureteral stent, right ureteroscopy possible laser lithotripsy, including side effects of the proposed treatment, the likelihood of the patient achieving the goals of the procedure, and any potential problems that might occur during the procedure or recuperation.  He would like to proceed with surgical intervention.  Again discussed he may have passed the stone although the CT was fairly recent.  Also discussed with Omari that he may need a stent in a staged procedure.  He did have a low-grade fever and slightly elevated white count but I think given the small size of the stone and its distal nature it is reasonable to try a limited ureteroscopy.  But if it begins to require a lot of manipulation again the safest thing would be to leave a stent and return in the future for ureteroscopy.  All questions answered.  He elects to proceed.    Dallen Bunte 08/16/2023, 7:38 AM

## 2023-08-16 NOTE — Anesthesia Postprocedure Evaluation (Signed)
 Anesthesia Post Note  Patient: Frederick Snyder  Procedure(s) Performed: CYSTOSCOPY; RIGHT URETEROSCOPY; BASKET STONE EXTRACTION RIGHT URETERAL STENT PLACEMENT (Right: Ureter)     Patient location during evaluation: PACU Anesthesia Type: General Level of consciousness: awake and alert Pain management: pain level controlled Vital Signs Assessment: post-procedure vital signs reviewed and stable Respiratory status: spontaneous breathing, nonlabored ventilation, respiratory function stable and patient connected to nasal cannula oxygen Cardiovascular status: blood pressure returned to baseline and stable Postop Assessment: no apparent nausea or vomiting Anesthetic complications: no  No notable events documented.  Last Vitals:  Vitals:   08/16/23 1030 08/16/23 1039  BP: 116/80 118/79  Pulse: 66 73  Resp: 13   Temp: 36.6 C 36.7 C  SpO2: 98% 98%    Last Pain:  Vitals:   08/16/23 1039  TempSrc: Oral  PainSc: 0-No pain                 Trevor Iha

## 2023-08-16 NOTE — ED Provider Notes (Signed)
 6:56 AM Urology will see Frederick Snyder and likely stenting  Received signout from previous provider, please see his note for complete H&P.  This is a 38 year old male with known history of kidney stone who developed right lower flank pain 4 days ago.  He was initially evaluated at an outside hospital for his symptoms.  He was treated for kidney stone and was prescribed oxycodone and tamsulosin along with Zofran.  Patient states despite taking medication prescribed, symptoms have not improved much.  He endorsed persistent nausea and vomiting along with flank pain.  Labs and imaging obtained today which demonstrates mild elevated white count of 12.3, creatinine is 1.46, UA without signs of UTI and CT scan of abdomen pelvis demonstrates mild to moderate interval worsening right hydroureteronephrosis at the distal UVJ, unchanged from prior.  CT Finding also concerning for pyelonephritis.  Urology has been consulted and will see patient in the ER to determine disposition.  Current treatment including Rocephin, morphine, Zofran, IV fluid, and Cipro.  8:01 AM Urology Dr. Mena Goes will admit patient for cystoscopy  BP 115/61   Pulse (!) 53   Temp 98.1 F (36.7 C) (Oral)   Resp (!) 22   Ht 6' (1.829 m)   Wt 136.1 kg   SpO2 96%   BMI 40.69 kg/m   Results for orders placed or performed during the hospital encounter of 08/15/23  CBC   Collection Time: 08/15/23 11:25 PM  Result Value Ref Range   WBC 12.3 (H) 4.0 - 10.5 K/uL   RBC 4.80 4.22 - 5.81 MIL/uL   Hemoglobin 13.8 13.0 - 17.0 g/dL   HCT 96.0 45.4 - 09.8 %   MCV 86.5 80.0 - 100.0 fL   MCH 28.8 26.0 - 34.0 pg   MCHC 33.3 30.0 - 36.0 g/dL   RDW 11.9 14.7 - 82.9 %   Platelets 250 150 - 400 K/uL   nRBC 0.0 0.0 - 0.2 %  Basic metabolic panel   Collection Time: 08/15/23 11:25 PM  Result Value Ref Range   Sodium 131 (L) 135 - 145 mmol/L   Potassium 3.5 3.5 - 5.1 mmol/L   Chloride 99 98 - 111 mmol/L   CO2 22 22 - 32 mmol/L   Glucose, Bld 114 (H) 70  - 99 mg/dL   BUN 14 6 - 20 mg/dL   Creatinine, Ser 5.62 (H) 0.61 - 1.24 mg/dL   Calcium 8.6 (L) 8.9 - 10.3 mg/dL   GFR, Estimated >13 >08 mL/min   Anion gap 10 5 - 15  Urinalysis, Routine w reflex microscopic -Urine, Clean Catch   Collection Time: 08/15/23 11:38 PM  Result Value Ref Range   Color, Urine AMBER (A) YELLOW   APPearance CLEAR CLEAR   Specific Gravity, Urine 1.030 1.005 - 1.030   pH 5.0 5.0 - 8.0   Glucose, UA NEGATIVE NEGATIVE mg/dL   Hgb urine dipstick NEGATIVE NEGATIVE   Bilirubin Urine NEGATIVE NEGATIVE   Ketones, ur NEGATIVE NEGATIVE mg/dL   Protein, ur 30 (A) NEGATIVE mg/dL   Nitrite NEGATIVE NEGATIVE   Leukocytes,Ua NEGATIVE NEGATIVE   RBC / HPF 0-5 0 - 5 RBC/hpf   WBC, UA 0-5 0 - 5 WBC/hpf   Bacteria, UA NONE SEEN NONE SEEN   Squamous Epithelial / HPF 0-5 0 - 5 /HPF   Mucus PRESENT   Protime-INR   Collection Time: 08/16/23 12:49 AM  Result Value Ref Range   Prothrombin Time 14.9 11.4 - 15.2 seconds   INR 1.1 0.8 - 1.2  APTT   Collection Time: 08/16/23 12:49 AM  Result Value Ref Range   aPTT 32 24 - 36 seconds  Hepatic function panel   Collection Time: 08/16/23 12:49 AM  Result Value Ref Range   Total Protein 7.0 6.5 - 8.1 g/dL   Albumin 3.8 3.5 - 5.0 g/dL   AST 18 15 - 41 U/L   ALT <5 0 - 44 U/L   Alkaline Phosphatase 75 38 - 126 U/L   Total Bilirubin 1.6 (H) 0.0 - 1.2 mg/dL   Bilirubin, Direct 0.3 (H) 0.0 - 0.2 mg/dL   Indirect Bilirubin 1.3 (H) 0.3 - 0.9 mg/dL  I-Stat Lactic Acid, ED   Collection Time: 08/16/23 12:54 AM  Result Value Ref Range   Lactic Acid, Venous 1.3 0.5 - 1.9 mmol/L  Resp panel by RT-PCR (RSV, Flu A&B, Covid) Anterior Nasal Swab   Collection Time: 08/16/23  1:31 AM   Specimen: Anterior Nasal Swab  Result Value Ref Range   SARS Coronavirus 2 by RT PCR NEGATIVE NEGATIVE   Influenza A by PCR NEGATIVE NEGATIVE   Influenza B by PCR NEGATIVE NEGATIVE   Resp Syncytial Virus by PCR NEGATIVE NEGATIVE   CT ABDOMEN PELVIS W  CONTRAST Result Date: 08/16/2023 CLINICAL DATA:  Pyelonephritis is suspected but the patient has a history of renal stones and obstruction. There is right flank pain and fever. Leukocytosis. Prior bariatric surgery with Roux-en-Y, prior left inguinal hernia repair. EXAM: CT ABDOMEN AND PELVIS WITH CONTRAST TECHNIQUE: Multidetector CT imaging of the abdomen and pelvis was performed using the standard protocol following bolus administration of intravenous contrast. RADIATION DOSE REDUCTION: This exam was performed according to the departmental dose-optimization program which includes automated exposure control, adjustment of the mA and/or kV according to patient size and/or use of iterative reconstruction technique. CONTRAST:  OMNIPAQUE IOHEXOL 300 MG/ML  SOLN COMPARISON:  Flat plate abdomen film today, recent CT without contrast 08/13/2023, CT abdomen pelvis with IV contrast 01/05/2023. FINDINGS: Lower chest: Linear scarring or atelectasis right lower lobe. No focal lung base infiltrate. Normal cardiac size. No pericardial effusion. Small sliding hiatus hernia. Hepatobiliary: 1 cm cyst again noted in hepatic segment 6, Hounsfield density is 15. There is no mass enhancement of the liver. The liver is 21.5 cm in length and moderately steatotic. The gallbladder and bile ducts are unremarkable. Pancreas: No abnormality Spleen: No focal abnormality or mass. Slight splenomegaly with splenic length 14.3 cm. Adrenals/Urinary Tract: There is no adrenal mass. There is mild-to-moderate interval worsened right hydroureteronephrosis above a 1 mm distal ureteral stone just under 1 cm from the UVJ. The position of the stone has not changed since 3 days ago. Small amount of fluid is developed underneath the right kidney which could be due to the obstructive uropathy or could indicate an early caliceal leak. No intrarenal stones or other ureteral stones are seen. There is asymmetric right cortical contrast retention and faint  hypoenhancement over portions of the right renal cortex, the latter concerning for pyelonephritis. There is no mass enhancement of either kidney. The bladder is unremarkable. 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 Left upper to mid abdominal small bowel anastomoses. No dilatation or wall thickening. Mobile cecum with normal appendix. Moderate retained stool ascending colon and cecum. Vascular/Lymphatic: Aortic atherosclerosis. No enlarged abdominal or pelvic lymph nodes. Reproductive: Prostate is unremarkable. Other: None. Musculoskeletal: No acute or significant osseous findings. IMPRESSION: 1. Mild-to-moderate interval worsened right hydroureteronephrosis above a 1 mm distal ureteral stone just under 1 cm from the UVJ.  The position of the stone has not changed since 3 days ago. 2. Small amount of fluid developed underneath the right kidney which could be due to the obstructive uropathy or could indicate an early caliceal leak. 3. Asymmetric right cortical contrast retention and faint hypoenhancement over portions of the right renal cortex, the latter concerning for pyelonephritis. 4. Hepatosplenomegaly with moderate hepatic steatosis. 5. Aortic atherosclerosis. 6. 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 7. Small sliding hiatus hernia. Aortic Atherosclerosis (ICD10-I70.0). Electronically Signed   By: Almira Bar M.D.   On: 08/16/2023 07:03   DG Abdomen 1 View Result Date: 08/16/2023 CLINICAL DATA:  History of right ureteral calculus EXAM: ABDOMEN - 1 VIEW COMPARISON:  08/13/2023 FINDINGS: Scattered large and small bowel gas is noted. The previously described distal right ureteral stone is not well appreciated on this exam. No bony abnormality is seen. IMPRESSION: No acute abnormality noted. Electronically Signed   By: Alcide Clever M.D.   On: 08/16/2023 03:34   DG Chest Port 1 View Result Date: 08/16/2023 CLINICAL DATA:  Possible sepsis EXAM: PORTABLE CHEST 1 VIEW COMPARISON:  11/13/2021 FINDINGS: Cardiac  shadow is within normal limits. Lungs are well aerated bilaterally. No bony abnormality is seen. IMPRESSION: No active disease. Electronically Signed   By: Alcide Clever M.D.   On: 08/16/2023 03:33      Fayrene Helper, PA-C 08/16/23 Mertie Moores    Linwood Dibbles, MD 08/17/23 [phone]

## 2023-08-16 NOTE — Op Note (Signed)
 Preoperative diagnosis: Right distal ureteral stone Postoperative diagnosis: Same  Procedure: Cystoscopy with right retrograde pyelogram, right ureteroscopy stone basket extraction and right ureteral stent placement  Surgeon: Mena Goes  Anesthesia: General  Indication for procedure: Frederick Snyder is a 38 year old male with a small but symptomatic right distal stone.  It was causing some significant hydronephrosis and perinephric stranding.  He had refractory nausea and pain.  He had a low-grade temp of 100 and was afebrile.  Findings: On exam the penis was circumcised without mass or lesion.  The glans and meatus appeared normal.  On cystoscopy the urethra prostate and bladder were unremarkable.  No stone or foreign body in the bladder.  There was some debris and blood effluxing from the right ureteral orifice.  Left E flux clear.  Right retrograde-this outlined a single ureter single collecting system unit with some narrowing at the UVJ and mild dilation proximally.  After wire passage there was efflux of hemorrhagic and proteinaceous debris.  Right ureteroscopy revealed a 1 to 2 mm stone at the right UVJ which was grasped with a basket and removed with no resistance.  Description of procedure: After consent was obtained patient brought to the operating room.  After adequate anesthesia she is placed in lithotomy position and prepped and draped in the usual sterile fashion.  Timeout was performed to confirm the patient and procedure.  Cystoscope was passed per urethra and the bladder inspected.  Right ureteral orifice was cannulated with a 6 [place] open-ended catheter and a gentle retrograde was performed.  I then advanced a sensor wire and coiled that in the upper calyx.  E flux of debris was noted.  I passed the 6 [place] open-ended catheter with some resistance into the distal ureter and then removed it.  A needlepoint long semirigid ureteroscope was advanced adjacent to the wire with minimal water  running.  Once in the distal ureter the stone was located.  It was grasped with a 0 tip basket and removed with no resistance.  The wire was then backloaded on the cystoscope and a 6 x 26 cm stent was advanced.  The wire was removed with a good coil seen up in the collecting system and a good coil in the bladder.  Stent was draining.  The scope was backed out and the stent taped to the patient.  He is awakened taken the cover room in stable condition.  Complications: None  Blood loss: Minimal  Specimens: None  Drains: 6 x 26 cm right ureteral stent with string  Disposition: Patient stable to PACU.

## 2023-08-16 NOTE — Discharge Instructions (Signed)
 Removal of the stent: Remove the stent as instructed on Tuesday morning, August 20, 2023.

## 2023-08-16 NOTE — Anesthesia Preprocedure Evaluation (Addendum)
 Anesthesia Evaluation  Patient identified by MRN, date of birth, ID band Patient awake    Reviewed: Allergy & Precautions, NPO status , Patient's Chart, lab work & pertinent test results  History of Anesthesia Complications (+) PONV and history of anesthetic complications  Airway Mallampati: II  TM Distance: >3 FB Neck ROM: Full    Dental no notable dental hx. (+) Teeth Intact, Dental Advisory Given   Pulmonary asthma , sleep apnea and Continuous Positive Airway Pressure Ventilation    Pulmonary exam normal breath sounds clear to auscultation       Cardiovascular hypertension, + PND  Normal cardiovascular exam Rhythm:Regular Rate:Normal     Neuro/Psych  Headaches PSYCHIATRIC DISORDERS Anxiety        GI/Hepatic ,GERD  ,,  Endo/Other    Class 3 obesity  Renal/GU Lab Results      Component                Value               Date                      NA                       131 (L)             08/15/2023                CL                       99                  08/15/2023                K                        3.5                 08/15/2023                CO2                      22                  08/15/2023                BUN                      14                  08/15/2023                CREATININE               1.46 (H)            08/15/2023                GFRNONAA                 >60                 08/15/2023                CALCIUM                  8.6 (L)  08/15/2023                ALBUMIN                  3.8                 08/16/2023                GLUCOSE                  114 (H)             08/15/2023                Musculoskeletal  (+) Arthritis ,    Abdominal   Peds  Hematology Lab Results      Component                Value               Date                      WBC                      12.3 (H)            08/15/2023                HGB                      13.8                 08/15/2023                HCT                      41.5                08/15/2023                MCV                      86.5                08/15/2023                PLT                      250                 08/15/2023              Anesthesia Other Findings   Reproductive/Obstetrics                             Anesthesia Physical Anesthesia Plan  ASA: 3 and emergent  Anesthesia Plan: General   Post-op Pain Management:    Induction: Intravenous  PONV Risk Score and Plan: 4 or greater and Treatment may vary due to age or medical condition, Midazolam, Ondansetron and Scopolamine patch - Pre-op  Airway Management Planned: LMA  Additional Equipment: None  Intra-op Plan:   Post-operative Plan:   Informed Consent: I have reviewed the patients History and Physical, chart, labs and discussed the procedure including the risks, benefits and alternatives for the proposed anesthesia with the patient or authorized representative who has indicated his/her understanding and acceptance.  Dental advisory given  Plan Discussed with: CRNA and Surgeon  Anesthesia Plan Comments:        Anesthesia Quick Evaluation

## 2023-08-16 NOTE — Anesthesia Procedure Notes (Signed)
 Procedure Name: LMA Insertion Date/Time: 08/16/2023 9:13 AM  Performed by: Garth Bigness, CRNAPre-anesthesia Checklist: Patient identified, Emergency Drugs available, Suction available and Patient being monitored Patient Re-evaluated:Patient Re-evaluated prior to induction Oxygen Delivery Method: Circle system utilized Preoxygenation: Pre-oxygenation with 100% oxygen Induction Type: IV induction Ventilation: Mask ventilation with difficulty and Two handed mask ventilation required LMA: LMA with gastric port inserted LMA Size: 5.0 Tube type: Oral Number of attempts: 1 Airway Equipment and Method: Oral airway Placement Confirmation: positive ETCO2 and breath sounds checked- equal and bilateral Tube secured with: Tape Dental Injury: Teeth and Oropharynx as per pre-operative assessment

## 2023-08-16 NOTE — Sepsis Progress Note (Signed)
 Elink monitoring for the code sepsis protocol.

## 2023-08-17 ENCOUNTER — Encounter (HOSPITAL_COMMUNITY): Payer: Self-pay | Admitting: Urology

## 2023-08-20 ENCOUNTER — Encounter: Payer: Self-pay | Admitting: Orthopaedic Surgery

## 2023-08-21 LAB — AEROBIC/ANAEROBIC CULTURE W GRAM STAIN (SURGICAL/DEEP WOUND)
Culture: NO GROWTH
Gram Stain: NONE SEEN

## 2023-08-21 LAB — CULTURE, BLOOD (ROUTINE X 2)
Culture: NO GROWTH
Culture: NO GROWTH
Special Requests: ADEQUATE

## 2023-08-25 ENCOUNTER — Other Ambulatory Visit (HOSPITAL_COMMUNITY): Payer: Self-pay

## 2023-08-28 ENCOUNTER — Encounter: Payer: Self-pay | Admitting: Behavioral Health

## 2023-08-28 ENCOUNTER — Other Ambulatory Visit (HOSPITAL_COMMUNITY): Payer: Self-pay

## 2023-08-28 ENCOUNTER — Ambulatory Visit (INDEPENDENT_AMBULATORY_CARE_PROVIDER_SITE_OTHER): Payer: PRIVATE HEALTH INSURANCE | Admitting: Behavioral Health

## 2023-08-28 DIAGNOSIS — F411 Generalized anxiety disorder: Secondary | ICD-10-CM

## 2023-08-28 DIAGNOSIS — F331 Major depressive disorder, recurrent, moderate: Secondary | ICD-10-CM | POA: Diagnosis not present

## 2023-08-28 MED ORDER — SERTRALINE HCL 100 MG PO TABS
100.0000 mg | ORAL_TABLET | Freq: Every day | ORAL | 1 refills | Status: DC
  Filled 2023-08-28: qty 90, 90d supply, fill #0
  Filled 2023-10-16: qty 30, 30d supply, fill #0
  Filled 2023-11-11: qty 30, 30d supply, fill #1

## 2023-08-28 NOTE — Progress Notes (Signed)
 [facility]  Patient ID: Frederick Snyder,  MRN: [id number]  PCP: Etta Grandchild, MD  Date of Evaluation: 08/28/2023 Time spent:20 minutes  Chief Complaint:  Chief Complaint   ADHD; Follow-up; Medication Refill; Depression; Anxiety     HISTORY/CURRENT STATUS: HPI  38 year old male presents to this office for follow up and medication management. 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 Maintaining weight loss. . Feeling much better about his progress. Anxiety and depression has improved. His is very happy with medication right now.  Since bypass he cannot take ER versions of medication.  Anxiety 2/10, Depression is 2/10. Says he is sleeping 7-8 hours per night. Requesting no medication changes today. Denies mania, no psychosis, no SI/HI.   Previous psychiatric medications: Ritalin Vyvanse Adderall  Zoloft   Individual Medical History/ Review of Systems: Changes? :No   Allergies: Nitrous oxide and Nsaids  Current Medications:  Current Outpatient Medications:    amphetamine-dextroamphetamine (ADDERALL) 30 MG tablet, Take 1 tablet by mouth 2 (two) times daily., Disp: 60 tablet, Rfl: 0   CALCIUM PO, Take 1 tablet by mouth in the morning, at noon, and at bedtime., Disp: , Rfl:    cephALEXin (KEFLEX) 500 MG capsule, Take 1 capsule (500 mg total) by mouth 3 (three) times daily., Disp: 21 capsule, Rfl: 0   HYDROcodone-acetaminophen (NORCO) 5-325 MG tablet, Take 1 tablet by mouth 3 (three) times daily as needed. To be taken after surgery (Patient not taking: Reported on 08/16/2023), Disp: 20 tablet, Rfl: 0   Multiple Vitamin (MULTIVITAMIN) tablet, Take 1 tablet by mouth daily., Disp: , Rfl:    ondansetron (ZOFRAN) 4 MG tablet, Take 1 tablet (4 mg total) by mouth every 8 (eight) hours as needed for nausea or vomiting. (Patient taking differently: Take 4 mg by mouth daily as needed for nausea or vomiting.), Disp: 40 tablet, Rfl: 0   ondansetron (ZOFRAN-ODT) 4 MG  disintegrating tablet, Dissolve 1 tablet (4 mg total) by mouth every 6 (six) hours as needed for nausea/vomiting. (Patient taking differently: Take 4 mg by mouth daily as needed for nausea or vomiting.), Disp: 30 tablet, Rfl: 0   oxyCODONE (OXY IR/ROXICODONE) 5 MG immediate release tablet, Take 1 tablet (5 mg total) by mouth every 6 (six) hours as needed. (Patient taking differently: Take 5 mg by mouth daily as needed for moderate pain (pain score 4-6) or severe pain (pain score 7-10).), Disp: 30 tablet, Rfl: 0   sertraline (ZOLOFT) 100 MG tablet, Take 1 tablet (100 mg total) by mouth daily., Disp: 90 tablet, Rfl: 1   tamsulosin (FLOMAX) 0.4 MG CAPS capsule, Take 1 capsule (0.4 mg total) by mouth daily., Disp: 20 capsule, Rfl: 0   valsartan (DIOVAN) 320 MG tablet, Take 1 tablet (320 mg total) by mouth daily., Disp: 90 tablet, Rfl: 0 Medication Side Effects: none  Family Medical/ Social History: Changes? No  MENTAL HEALTH EXAM:  There were no vitals taken for this visit.There is no height or weight on file to calculate BMI.  General Appearance: Casual  Eye Contact:  Good  Speech:  Clear and Coherent  Volume:  Normal  Mood:  NA  Affect:  Appropriate  Thought Process:  Coherent  Orientation:  Full (Time, Place, and Person)  Thought Content: Logical   Suicidal Thoughts:  No  Homicidal Thoughts:  No  Memory:  WNL  Judgement:  Good  Insight:  Good  Psychomotor Activity:  Normal  Concentration:  Concentration: Good  Recall:  Good  Fund  of Knowledge: Good  Language: Good  Assets:  Desire for Improvement  ADL's:  Intact  Cognition: WNL  Prognosis:  Good    DIAGNOSES:    ICD-10-CM   1. Major depressive disorder, recurrent episode, moderate (HCC)  F33.1 sertraline (ZOLOFT) 100 MG tablet    2. Generalized anxiety disorder  F41.1 sertraline (ZOLOFT) 100 MG tablet      Receiving Psychotherapy: No    RECOMMENDATIONS:   Greater than 50% of 30  min face to face time with patient was  spent on counseling and coordination of care. Discussed his current stability with Zoloft.  ADHD symptoms are well controlled. Happy with current medication and requesting no med changes this visit.   To continue Adderall to 30 mg IR twice daily To continue  Zoloft to 100 mg daily.  To follow up in 3 months to reassess Will monitor BP and HR regularly and log to provide to PCP Will report side effects or worsening symptoms and seek medical attention immediately if BP rises above 180.  Discussed potential benefits, risks, and side effects of stimulants with patient to include increased heart rate, palpitations, insomnia, increased anxiety, increased irritability, or decreased appetite.  Instructed patient to contact office if experiencing any significant tolerability issues.  Reviewed PDMP     Joan Flores, NP

## 2023-09-17 ENCOUNTER — Other Ambulatory Visit: Payer: Self-pay | Admitting: Behavioral Health

## 2023-09-17 DIAGNOSIS — F902 Attention-deficit hyperactivity disorder, combined type: Secondary | ICD-10-CM

## 2023-09-18 ENCOUNTER — Encounter: Payer: Self-pay | Admitting: Orthopaedic Surgery

## 2023-09-18 ENCOUNTER — Other Ambulatory Visit (HOSPITAL_COMMUNITY): Payer: Self-pay

## 2023-09-18 ENCOUNTER — Ambulatory Visit (INDEPENDENT_AMBULATORY_CARE_PROVIDER_SITE_OTHER): Payer: PRIVATE HEALTH INSURANCE | Admitting: Physician Assistant

## 2023-09-18 ENCOUNTER — Other Ambulatory Visit: Payer: Self-pay

## 2023-09-18 DIAGNOSIS — M7712 Lateral epicondylitis, left elbow: Secondary | ICD-10-CM

## 2023-09-18 MED ORDER — AMPHETAMINE-DEXTROAMPHETAMINE 30 MG PO TABS
30.0000 mg | ORAL_TABLET | Freq: Two times a day (BID) | ORAL | 0 refills | Status: DC
  Filled 2023-09-18: qty 60, 30d supply, fill #0

## 2023-09-18 NOTE — Progress Notes (Signed)
 Post-Op Visit Note   Patient: Frederick Snyder           Date of Birth: 02/15/1986           MRN: [id number] Visit Date: 09/18/2023 PCP: Etta Grandchild, MD   Assessment & Plan:  Chief Complaint:  Chief Complaint  Patient presents with   Left Elbow - Follow-up    Left tennis elbow release 08/07/2023   Visit Diagnoses:  1. Left tennis elbow     Plan: Patient is a pleasant 38 year old gentleman who comes in today approximately 6 weeks status post left tennis elbow release 08/07/2023.  He has been doing well.  No pain.  He has been compliant wearing his wrist splint.  He has not yet started occupational therapy.  Examination left elbow reveals a fully healed surgical scar without complication.  He is neurovascularly intact distally.  At this point, he may discontinue his wrist splint.  I will send in another referral for occupational therapy.  He will let us know if he has not been contacted within a week.  Follow-up in 6 weeks for recheck.  Call with concerns or questions.  Follow-Up Instructions: Return in about 6 weeks (around 10/30/2023).   Orders:  No orders of the defined types were placed in this encounter.  No orders of the defined types were placed in this encounter.   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/30/2021   Pure hypertriglyceridemia 05/03/2021   OSA on CPAP 05/03/2021   Prediabetes 05/03/2021   Primary hypertension 09/07/2020   Morbid obesity with BMI of 50.0-59.9, adult (HCC) 09/07/2020   Loud snoring 09/07/2020   Encounter for general adult medical examination with abnormal findings 09/07/2020   Vitamin D deficiency disease 09/07/2020   OCD (obsessive compulsive disorder) 04/06/2018   Attention deficit hyperactivity disorder (ADHD) 04/06/2018   Hyperprolactinemia (HCC) 02/26/2018    Asthma 05/25/2009   Past Medical History:  Diagnosis Date   Anemia    Anxiety    Asthma    Depression    GERD (gastroesophageal reflux disease)    hx of   History of kidney stones    Hyperlipidemia    diet controlled - no meds   Hypertension    Migraine    OSA on CPAP 05/03/2021   PONV (postoperative nausea and vomiting)    Vitamin D deficiency     Family History  Problem Relation Age of Onset   Diabetes Mother    Heart disease Mother    Sleep apnea Mother    Diabetes Father    Heart disease Father    Hypertension Father    Sleep apnea Father    Diabetes Other    Hyperlipidemia Other    Hypertension Other    Other Neg Hx        hypogonadism    Past Surgical History:  Procedure Laterality Date   CYSTOSCOPY/URETEROSCOPY/HOLMIUM LASER/STENT PLACEMENT Right 08/16/2023   Procedure: CYSTOSCOPY; RIGHT URETEROSCOPY; BASKET STONE EXTRACTION RIGHT URETERAL STENT PLACEMENT;  Surgeon: Jerilee Field, MD;  Location: [facility];  Service: Urology;  Laterality: Right;   GASTRIC ROUX-EN-Y N/A 05/28/2022   Procedure: LAPAROSCOPIC ROUX-EN-Y GASTRIC BYPASS WITH UPPER ENDOSCOPY;  Surgeon: [name], MD;  Location: [facility];  Service: General;  Laterality: N/A;   INGUINAL HERNIA REPAIR Left 12/08/2020   Procedure: OPEN  HERNIA REPAIR INGUINAL ADULT;  Surgeon: Abigail Miyamoto, MD;  Location[facility];  Service: General;  Laterality: Left;  TAP BLOCK AND LMA.   INSERTION OF MESH Left 12/08/2020   Procedure: INSERTION OF MESH;  Surgeon: Abigail Miyamoto, MD;  Location: [facility] OR;  Service: General;  Laterality: Left;   KNEE SURGERY     TENNIS ELBOW RELEASE/NIRSCHEL PROCEDURE Left 08/07/2023   Procedure: LEFT TENNIS ELBOW RELEASE;  Surgeon: Tarry Kos, MD;  Location: [facility];  Service: Orthopedics;  Laterality: Left;   tubes in ears     UPPER GI ENDOSCOPY N/A 05/28/2022   Procedure: UPPER GI ENDOSCOPY;  Surgeon: [name], MD;  Location: [facility];  Service: General;  Laterality:  N/A;   WISDOM TOOTH EXTRACTION     Social History   Occupational History   Occupation: [employer].   Tobacco Use   Smoking status: Never   Smokeless tobacco: Never   Tobacco comments:    Quit in approx 2007  Vaping Use   Vaping status: Never Used  Substance and Sexual Activity   Alcohol use: No   Drug use: No   Sexual activity: Yes    Partners: Female

## 2023-09-25 NOTE — Therapy (Signed)
 OUTPATIENT OCCUPATIONAL THERAPY ORTHO EVALUATION  Patient Name: Frederick Snyder MRN: [id number] DOB:11-11-1985, 38 y.o., male Today's Date: 09/26/2023  PCP: Kathlene Paradise MD REFERRING PROVIDER: Sandie Cross, PA-C   END OF SESSION:  OT End of Session - 09/26/23 1148     Visit Number 1    Number of Visits 7    Date for OT Re-Evaluation 11/08/23    Authorization Type "generic"    OT Start Time 1148    OT Stop Time 1220    OT Time Calculation (min) 32 min    Activity Tolerance Patient tolerated treatment well;No increased pain;Patient limited by fatigue;Patient limited by pain    Behavior During Therapy [facility] for tasks assessed/performed             Past Medical History:  Diagnosis Date   Anemia    Anxiety    Asthma    Depression    GERD (gastroesophageal reflux disease)    hx of   History of kidney stones    Hyperlipidemia    diet controlled - no meds   Hypertension    Migraine    OSA on CPAP 05/03/2021   PONV (postoperative nausea and vomiting)    Vitamin D deficiency    Past Surgical History:  Procedure Laterality Date   CYSTOSCOPY/URETEROSCOPY/HOLMIUM LASER/STENT PLACEMENT Right 08/16/2023   Procedure: CYSTOSCOPY; RIGHT URETEROSCOPY; BASKET STONE EXTRACTION RIGHT URETERAL STENT PLACEMENT;  Surgeon: Christina Coyer, MD;  Location: [facility];  Service: Urology;  Laterality: Right;   GASTRIC ROUX-EN-Y N/A 05/28/2022   Procedure: LAPAROSCOPIC ROUX-EN-Y GASTRIC BYPASS WITH UPPER ENDOSCOPY;  Surgeon: Aldean Hummingbird, MD;  Location: [facility];  Service: General;  Laterality: N/A;   INGUINAL HERNIA REPAIR Left 12/08/2020   Procedure: OPEN HERNIA REPAIR INGUINAL ADULT;  Surgeon: Oza Blumenthal, MD;  Location: [facility];  Service: General;  Laterality: Left;  TAP BLOCK AND LMA.   INSERTION OF MESH Left 12/08/2020   Procedure: INSERTION OF MESH;  Surgeon: Oza Blumenthal, MD;  Location: [facility] OR;  Service: General;  Laterality: Left;   KNEE SURGERY     TENNIS ELBOW  RELEASE/NIRSCHEL PROCEDURE Left 08/07/2023   Procedure: LEFT TENNIS ELBOW RELEASE;  Surgeon: Wes Hamman, MD;  Location: [facility];  Service: Orthopedics;  Laterality: Left;   tubes in ears     UPPER GI ENDOSCOPY N/A 05/28/2022   Procedure: UPPER GI ENDOSCOPY;  Surgeon: Aldean Hummingbird, MD;  Location: [facility];  Service: General;  Laterality: N/A;   WISDOM TOOTH EXTRACTION     Patient Active Problem List   Diagnosis Date Noted   S/P gastric bypass 05/28/2022   Left tennis elbow 03/04/2022   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 [facility]) 08/30/2021   Pure hypertriglyceridemia 05/03/2021   OSA on CPAP 05/03/2021   Prediabetes 05/03/2021   Primary hypertension 09/07/2020   Morbid obesity with BMI of 50.0-59.9, adult (HCC) 09/07/2020   Loud snoring 09/07/2020   Encounter for general adult medical examination with abnormal findings 09/07/2020   Vitamin D deficiency disease 09/07/2020   OCD (obsessive compulsive disorder) 04/06/2018   Attention deficit hyperactivity disorder (ADHD) 04/06/2018   Hyperprolactinemia (HCC) 02/26/2018   Asthma 05/25/2009    ONSET DATE: DOS 08/07/23  REFERRING DIAG: M77.12 (ICD-10-CM) - Left tennis elbow   THERAPY DIAG:  Pain in left elbow - Plan: Ot plan of care cert/re-cert  Localized edema - Plan: Ot plan of care cert/re-cert  Muscle weakness (generalized) - Plan: Ot plan of care cert/re-cert  Other lack of  coordination - Plan: Ot plan of care cert/re-cert  Stiffness of left elbow, not elsewhere classified - Plan: Ot plan of care cert/re-cert  Rationale for Evaluation and Treatment: Rehabilitation  SUBJECTIVE:   SUBJECTIVE STATEMENT: Now approx 7 weeks s/p Lt lateral epicondylectomy.   He states having no significant pain now, but some mild pain with work activities since he is weaned from his wrist brace.Marland Kitchen  He states having 1 to 2 years of pain and problems at the elbow before having the surgery    PERTINENT HISTORY: Ot eval and  treat S/p left tennis elbow release  PRECAUTIONS: None  RED FLAGS: None   WEIGHT BEARING RESTRICTIONS: Yes limit to 5 to 10 pounds or anything that is painful.  PAIN:  Are you having pain? Yes: NPRS scale: 0/10 at rest now, up to 3/10 at worst Pain location: Left lateral elbow Pain description: Mildly achy Aggravating factors: Heavy weightbearing Relieving factors: Rest  FALLS: Has patient fallen in last 6 months? No   PLOF: Independent  PATIENT GOALS: Safely return to full function with the left dominant hand and arm  NEXT MD VISIT: As needed    OBJECTIVE: (All objective assessments below are from initial evaluation on: 09/26/23 unless otherwise specified.)   HAND DOMINANCE: LEFT  ADLs: Overall ADLs: States decreased ability to grab, hold household objects, pain and difficulty to open containers, perform FMS tasks (manipulate fasteners on clothing), mild to moderate bathing problems as well.    FUNCTIONAL OUTCOME MEASURES: Eval: Patient Specific Functional Scale: 7 (open jars, typing, playing pool)  (Higher Score  =  Better Ability for the Selected Tasks)      UPPER EXTREMITY ROM     Shoulder to Wrist AROM Right eval Left eval  Shoulder flexion    Shoulder abduction    Shoulder extension    Shoulder internal rotation    Shoulder external rotation    Elbow flexion  141 tight  Elbow extension  (-6)   Forearm supination  80  Forearm pronation   81  Wrist flexion  77  Wrist extension  69  Wrist ulnar deviation    Wrist radial deviation    Functional dart thrower's motion (F-DTM) in ulnar flexion    F-DTM in radial extension     (Blank rows = not tested)   Hand AROM Right eval Left eval  Full Fist Ability (or Gap to Distal Palmar Crease)  full  Thumb Opposition  (Kapandji Scale)   9/10  Thumb MCP (0-60)    Thumb IP (0-80)    Thumb Radial Abduction Span     Thumb Palmar Abduction Span     Index MCP (0-90)     Index PIP (0-100)     Index DIP (0-70)       Long MCP (0-90)      Long PIP (0-100)      Long DIP (0-70)      Ring MCP (0-90)      Ring PIP (0-100)      Ring DIP (0-70)      Little MCP (0-90)      Little PIP (0-100)      Little DIP (0-70)      (Blank rows = not tested)   UPPER EXTREMITY MMT:    Eval:  NT at eval due to recent and still healing injuries. Will be tested when appropriate.   MMT Right TBD Left TBD  Shoulder flexion    Shoulder abduction    Shoulder adduction  Shoulder extension    Shoulder internal rotation    Shoulder external rotation    Middle trapezius    Lower trapezius    Elbow flexion    Elbow extension    Forearm supination    Forearm pronation    Wrist flexion    Wrist extension    Wrist ulnar deviation    Wrist radial deviation    (Blank rows = not tested)  HAND FUNCTION: Eval: No observed weakness in affected Lt hand.  Grip strength Right: 118 lbs, Left: 91 lbs   COORDINATION: Eval: No observed coordination impairments with affected Lt hand.  SENSATION: Eval: Light touch intact today, though diminished around sx area    EDEMA:   Eval:  Mildly swollen in Lt elbow   COGNITION: Eval: Overall cognitive status: WFL for evaluation today   OBSERVATIONS:   Eval: Surgical area healing well, not overly tender or hypersensitive, states some slight numbness.   TODAY'S TREATMENT:  Post-evaluation treatment:   He was given self-care/safety information to do no aggressive weightbearing, no lifting more than 5 or 10 pounds or basically anything painful now.  If he has to lift, he should do so with his palm up.  He should do light scar mobilization and massage 2-3 times a day for 2 to 3 minutes at a time.  He was also given the following home exercise program to perform 3-4 times a day as tolerated including active range of motion to tension points with light squeeze, light to medium stretches at the tricep, elbow, wrist.  He states these things feel good, no questions or concerns at the  end of the session.    Exercises - Tricep Stretch- DO SEATED BY TABLE  - 3-4 x daily - 3 reps - 15 hold - Standing Elbow Flexion Extension AROM  - 4-6 x daily - 5-10 reps - elbow flexion STRETCH  - 3-4 x daily - 3-5 reps - 15 sec hold - Seated Elbow PROM Blocked Extension  - 3-4 x daily - 3-5 reps - 20 sec hold - Bend and Pull Back Wrist SLOWLY  - 4 x daily - 5-10 reps - Wrist Prayer Stretch  - 4 x daily - 1-2 reps - 15 sec hold - Seated Wrist Flexion Stretch  - 3-4 x daily - 3 reps - 15 second  hold  PATIENT EDUCATION: Education details: See tx section above for details  Person educated: Patient Education method: Verbal Instruction, Teach back, Handouts  Education comprehension: States and demonstrates understanding, Additional Education required    HOME EXERCISE PROGRAM: Access Code: [email or web address] URL: [email or web address] Date: 09/26/2023 Prepared by: Leartis Proud   GOALS: Goals reviewed with patient? Yes   SHORT TERM GOALS: (STG required if POC>30 days) Target Date: 10/04/2023  Pt will demo/state understanding of initial HEP to improve pain levels and prerequisite motion. Goal status: INITIAL   LONG TERM GOALS: Target Date: 11/08/2023  Pt will improve functional ability by decreased impairment per PSFS  assessment from 7 to 9 or better, for better quality of life. Goal status: INITIAL  2.  Pt will improve A/ROM in left wrist extension from 69 degrees to at least 73 degrees, to have functional motion for tasks like reach and grasp.  Goal status: INITIAL  3.  Pt will improve strength in left elbow extension and wrist extension from apparent 3+/5 MMT to at least 4+/5 MMT to have increased functional ability to carry out selfcare and higher-level homecare tasks with less difficulty. Goal status:  INITIAL  4.  Pt will decrease pain at worst from 3/10 to "none significant" to have better sleep and occupational participation in daily roles. Goal status:  INITIAL   ASSESSMENT:  CLINICAL IMPRESSION: Patient is a 38 y.o. male who was seen today for occupational therapy evaluation for left lateral epicondylectomy and subsequent stiffness, weakness, decreased ability.  He will benefit from outpatient occupational therapy to decrease symptoms and increase quality of life.   PERFORMANCE DEFICITS: in functional skills including IADLs, ROM, strength, pain, fascial restrictions, body mechanics, endurance, decreased knowledge of precautions, and UE functional use,   IMPAIRMENTS: are limiting patient from IADLs and work.   COMORBIDITIES: may have co-morbidities  that affects occupational performance. Patient will benefit from skilled OT to address above impairments and improve overall function.  MODIFICATION OR ASSISTANCE TO COMPLETE EVALUATION: No modification of tasks or assist necessary to complete an evaluation.  OT OCCUPATIONAL PROFILE AND HISTORY: Problem focused assessment: Including review of records relating to presenting problem.  CLINICAL DECISION MAKING: LOW - limited treatment options, no task modification necessary  REHAB POTENTIAL: Excellent  EVALUATION COMPLEXITY: Low      PLAN:  OT FREQUENCY: 1x/week  OT DURATION: 6 weeks through 11/08/2023 and up to 7 total visits as needed  PLANNED INTERVENTIONS: 97168 OT Re-evaluation, 97535 self care/ADL training, 40981 therapeutic exercise, 97530 therapeutic activity, 97112 neuromuscular re-education, 97140 manual therapy, 97010 moist heat, 97010 cryotherapy, passive range of motion, compression bandaging, and patient/family education  RECOMMENDED OTHER SERVICES: None now  CONSULTED AND AGREED WITH PLAN OF CARE: Patient  PLAN FOR NEXT SESSION:   Upgrade HEP to include light eccentric or isometric training.  A week later shift to more liberal strengthening as tolerated.  Emphasize pain-free, building his body up cautiously and slowly   Leartis Proud, OTR/L, CHT 09/26/2023, 12:31  PM

## 2023-09-26 ENCOUNTER — Encounter: Payer: Self-pay | Admitting: Rehabilitative and Restorative Service Providers"

## 2023-09-26 ENCOUNTER — Ambulatory Visit: Payer: PRIVATE HEALTH INSURANCE | Admitting: Rehabilitative and Restorative Service Providers"

## 2023-09-26 DIAGNOSIS — R600 Localized edema: Secondary | ICD-10-CM | POA: Diagnosis not present

## 2023-09-26 DIAGNOSIS — R278 Other lack of coordination: Secondary | ICD-10-CM | POA: Diagnosis not present

## 2023-09-26 DIAGNOSIS — M25522 Pain in left elbow: Secondary | ICD-10-CM

## 2023-09-26 DIAGNOSIS — M25622 Stiffness of left elbow, not elsewhere classified: Secondary | ICD-10-CM

## 2023-09-26 DIAGNOSIS — M6281 Muscle weakness (generalized): Secondary | ICD-10-CM

## 2023-10-02 NOTE — Therapy (Signed)
 OUTPATIENT OCCUPATIONAL THERAPY TREATMENT NOTE   Patient Name: Frederick Snyder MRN: [id number] DOB:02/27/1986, 38 y.o., male Today's Date: 10/03/2023  PCP: Kathlene Paradise MD REFERRING PROVIDER: Sandie Cross, PA-C   END OF SESSION:  OT End of Session - 10/03/23 1557     Visit Number 2    Number of Visits 7    Date for OT Re-Evaluation 11/08/23    Authorization Type "generic"    OT Start Time 1552    OT Stop Time 1632    OT Time Calculation (min) 40 min    Activity Tolerance Patient tolerated treatment well;No increased pain;Patient limited by fatigue;Patient limited by pain    Behavior During Therapy [facility] for tasks assessed/performed              Past Medical History:  Diagnosis Date   Anemia    Anxiety    Asthma    Depression    GERD (gastroesophageal reflux disease)    hx of   History of kidney stones    Hyperlipidemia    diet controlled - no meds   Hypertension    Migraine    OSA on CPAP 05/03/2021   PONV (postoperative nausea and vomiting)    Vitamin D deficiency    Past Surgical History:  Procedure Laterality Date   CYSTOSCOPY/URETEROSCOPY/HOLMIUM LASER/STENT PLACEMENT Right 08/16/2023   Procedure: CYSTOSCOPY; RIGHT URETEROSCOPY; BASKET STONE EXTRACTION RIGHT URETERAL STENT PLACEMENT;  Surgeon: Christina Coyer, MD;  Location: [facility];  Service: Urology;  Laterality: Right;   GASTRIC ROUX-EN-Y N/A 05/28/2022   Procedure: LAPAROSCOPIC ROUX-EN-Y GASTRIC BYPASS WITH UPPER ENDOSCOPY;  Surgeon: Aldean Hummingbird, MD;  Location: [facility];  Service: General;  Laterality: N/A;   INGUINAL HERNIA REPAIR Left 12/08/2020   Procedure: OPEN HERNIA REPAIR INGUINAL ADULT;  Surgeon: Oza Blumenthal, MD;  Location: [facility];  Service: General;  Laterality: Left;  TAP BLOCK AND LMA.   INSERTION OF MESH Left 12/08/2020   Procedure: INSERTION OF MESH;  Surgeon: Oza Blumenthal, MD;  Location: [facility] OR;  Service: General;  Laterality: Left;   KNEE SURGERY     TENNIS ELBOW  RELEASE/NIRSCHEL PROCEDURE Left 08/07/2023   Procedure: LEFT TENNIS ELBOW RELEASE;  Surgeon: Wes Hamman, MD;  Location: [facility];  Service: Orthopedics;  Laterality: Left;   tubes in ears     UPPER GI ENDOSCOPY N/A 05/28/2022   Procedure: UPPER GI ENDOSCOPY;  Surgeon: Aldean Hummingbird, MD;  Location: [facility];  Service: General;  Laterality: N/A;   WISDOM TOOTH EXTRACTION     Patient Active Problem List   Diagnosis Date Noted   S/P gastric bypass 05/28/2022   Left tennis elbow 03/04/2022   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 [facility]) 08/30/2021   Pure hypertriglyceridemia 05/03/2021   OSA on CPAP 05/03/2021   Prediabetes 05/03/2021   Primary hypertension 09/07/2020   Morbid obesity with BMI of 50.0-59.9, adult (HCC) 09/07/2020   Loud snoring 09/07/2020   Encounter for general adult medical examination with abnormal findings 09/07/2020   Vitamin D deficiency disease 09/07/2020   OCD (obsessive compulsive disorder) 04/06/2018   Attention deficit hyperactivity disorder (ADHD) 04/06/2018   Hyperprolactinemia (HCC) 02/26/2018   Asthma 05/25/2009    ONSET DATE: DOS 08/07/23  REFERRING DIAG: M77.12 (ICD-10-CM) - Left tennis elbow   THERAPY DIAG:  Localized edema  Muscle weakness (generalized)  Other lack of coordination  Pain in left elbow  Stiffness of left elbow, not elsewhere classified  Rationale for Evaluation and Treatment: Rehabilitation  PERTINENT HISTORY:  Ot eval and treat S/p left tennis elbow release He states having no significant pain now, but some mild pain with work activities since he is weaned from his wrist brace.Frederick Snyder  He states having 1 to 2 years of pain and problems at the elbow before having the surgery  PRECAUTIONS: None  RED FLAGS: None   WEIGHT BEARING RESTRICTIONS: Yes limit to 5 to 10 pounds or anything that is painful.    SUBJECTIVE:   SUBJECTIVE STATEMENT: Now approx 8 weeks s/p Lt lateral epicondylectomy.   He states he had an  exacerbation over the past week, when he lifted up a stack of chairs.  He felt a sharp pain, it did resolve somewhat, but he is in more pain today than when met at evaluation.    PAIN:  Are you having pain? Yes: NPRS scale: 4-5/10 at rest now Pain location: Left lateral elbow Pain description: Mildly achy Aggravating factors: Heavy weightbearing Relieving factors: Rest   PATIENT GOALS: Safely return to full function with the left dominant hand and arm  NEXT MD VISIT: As needed    OBJECTIVE: (All objective assessments below are from initial evaluation on: 09/26/23 unless otherwise specified.)   HAND DOMINANCE: LEFT  ADLs: Overall ADLs: States decreased ability to grab, hold household objects, pain and difficulty to open containers, perform FMS tasks (manipulate fasteners on clothing), mild to moderate bathing problems as well.    FUNCTIONAL OUTCOME MEASURES: Eval: Patient Specific Functional Scale: 7 (open jars, typing, playing pool)  (Higher Score  =  Better Ability for the Selected Tasks)      UPPER EXTREMITY ROM     Shoulder to Wrist AROM Left eval  Shoulder flexion   Shoulder abduction   Shoulder extension   Shoulder internal rotation   Shoulder external rotation   Elbow flexion 141 tight  Elbow extension (-6)   Forearm supination 80  Forearm pronation  81  Wrist flexion 77  Wrist extension 69  Wrist ulnar deviation   Wrist radial deviation   Functional dart thrower's motion (F-DTM) in ulnar flexion   F-DTM in radial extension    (Blank rows = not tested)   Hand AROM Left eval  Full Fist Ability (or Gap to Distal Palmar Crease) full  Thumb Opposition  (Kapandji Scale)  9/10  Thumb MCP (0-60)   Thumb IP (0-80)   Thumb Radial Abduction Span    Thumb Palmar Abduction Span    Index MCP (0-90)    Index PIP (0-100)    Index DIP (0-70)    Long MCP (0-90)    Long PIP (0-100)    Long DIP (0-70)    Ring MCP (0-90)    Ring PIP (0-100)    Ring DIP (0-70)     Little MCP (0-90)    Little PIP (0-100)    Little DIP (0-70)    (Blank rows = not tested)   UPPER EXTREMITY MMT:    Eval:  NT at eval due to recent and still healing injuries. Will be tested when appropriate.   MMT Right TBD Left TBD  Shoulder flexion    Shoulder abduction    Shoulder adduction    Shoulder extension    Shoulder internal rotation    Shoulder external rotation    Middle trapezius    Lower trapezius    Elbow flexion    Elbow extension    Forearm supination    Forearm pronation    Wrist flexion    Wrist extension  Wrist ulnar deviation    Wrist radial deviation    (Blank rows = not tested)  HAND FUNCTION: Eval: No observed weakness in affected Lt hand.  Grip strength Right: 118 lbs, Left: 91 lbs   COORDINATION: Eval: No observed coordination impairments with affected Lt hand.  SENSATION: Eval: Light touch intact today, though diminished around sx area    EDEMA:   Eval:  Mildly swollen in Lt elbow   OBSERVATIONS:   Eval: Surgical area healing well, not overly tender or hypersensitive, states some slight numbness.   TODAY'S TREATMENT:  10/03/23: As he is coming in with more pain today than in the last session, we discussed what to do in an exacerbation.  OT puts him on ice for 5 minutes while discussing self-care, including using ice, doing light massages, avoiding lifting pushing and pulling, and doing gentle stretches.  OT then does manual therapy IASTM to the tricep, lateral elbow, dorsal forearm gently with him feeling some relief of tension throughout.  He states having no pain after this.  We then review and work on his stretches gently as listed below.  At the end of the session, he has no pain and feels much better.  Today, OT also discusses some supportive bracing options to help prevent exacerbation or overuse including counterforce strap and compressive elbow sleeve.  He states that he may purchase 1 of these, and leaves without pain, thanks  the therapist.      Exercises reviewed today: - Tricep Stretch- DO SEATED BY TABLE  - 3-4 x daily - 3 reps - 15 hold - Seated Elbow PROM Blocked Extension  - 3-4 x daily - 3-5 reps - 20 sec hold - Bend and Pull Back Wrist SLOWLY  - 4 x daily - 5-10 reps - Seated Wrist Flexion Stretch  - 3-4 x daily - 3 reps - 15 second  hold  PATIENT EDUCATION: Education details: See tx section above for details  Person educated: Patient Education method: Verbal Instruction, Teach back, Handouts  Education comprehension: States and demonstrates understanding, Additional Education required    HOME EXERCISE PROGRAM: Access Code: [email or web address] URL: [email or web address] Date: 09/26/2023 Prepared by: Leartis Proud   GOALS: Goals reviewed with patient? Yes   SHORT TERM GOALS: (STG required if POC>30 days) Target Date: 10/04/2023  Pt will demo/state understanding of initial HEP to improve pain levels and prerequisite motion. Goal status: 10/03/23: MET   LONG TERM GOALS: Target Date: 11/08/2023  Pt will improve functional ability by decreased impairment per PSFS  assessment from 7 to 9 or better, for better quality of life. Goal status: INITIAL  2.  Pt will improve A/ROM in left wrist extension from 69 degrees to at least 73 degrees, to have functional motion for tasks like reach and grasp.  Goal status: INITIAL  3.  Pt will improve strength in left elbow extension and wrist extension from apparent 3+/5 MMT to at least 4+/5 MMT to have increased functional ability to carry out selfcare and higher-level homecare tasks with less difficulty. Goal status: INITIAL  4.  Pt will decrease pain at worst from 3/10 to "none significant" to have better sleep and occupational participation in daily roles. Goal status: INITIAL   ASSESSMENT:  CLINICAL IMPRESSION: 10/03/23: Unfortunately he had a bit of an exacerbation, but fortunately therapy was able to reduce his pain to nothing.  He should  continue to be mindful and proceed cautiously for another few weeks.  We will try to get back on track  with light strengthening next session if he is feeling much better.  Eval: Patient is a 38 y.o. male who was seen today for occupational therapy evaluation for left lateral epicondylectomy and subsequent stiffness, weakness, decreased ability.  He will benefit from outpatient occupational therapy to decrease symptoms and increase quality of life.    PLAN:  OT FREQUENCY: 1x/week  OT DURATION: 6 weeks through 11/08/2023 and up to 7 total visits as needed  PLANNED INTERVENTIONS: 97168 OT Re-evaluation, 97535 self care/ADL training, 56213 therapeutic exercise, 97530 therapeutic activity, 97112 neuromuscular re-education, 97140 manual therapy, 97010 moist heat, 97010 cryotherapy, passive range of motion, compression bandaging, and patient/family education  RECOMMENDED OTHER SERVICES: None now  CONSULTED AND AGREED WITH PLAN OF CARE: Patient  PLAN FOR NEXT SESSION:    Upgrade HEP to include light eccentric or isometric training.  A week later shift to more liberal strengthening as tolerated.  Emphasize pain-free, building his body up cautiously and slowly   Leartis Proud, OTR/L, CHT 10/03/2023, 4:41 PM

## 2023-10-03 ENCOUNTER — Encounter: Payer: Self-pay | Admitting: Rehabilitative and Restorative Service Providers"

## 2023-10-03 ENCOUNTER — Ambulatory Visit (INDEPENDENT_AMBULATORY_CARE_PROVIDER_SITE_OTHER): Payer: PRIVATE HEALTH INSURANCE | Admitting: Rehabilitative and Restorative Service Providers"

## 2023-10-03 DIAGNOSIS — R600 Localized edema: Secondary | ICD-10-CM

## 2023-10-03 DIAGNOSIS — R278 Other lack of coordination: Secondary | ICD-10-CM | POA: Diagnosis not present

## 2023-10-03 DIAGNOSIS — M6281 Muscle weakness (generalized): Secondary | ICD-10-CM | POA: Diagnosis not present

## 2023-10-03 DIAGNOSIS — M25622 Stiffness of left elbow, not elsewhere classified: Secondary | ICD-10-CM

## 2023-10-03 DIAGNOSIS — M25522 Pain in left elbow: Secondary | ICD-10-CM | POA: Diagnosis not present

## 2023-10-04 ENCOUNTER — Other Ambulatory Visit (HOSPITAL_COMMUNITY): Payer: Self-pay

## 2023-10-04 MED ORDER — ALPRAZOLAM 0.5 MG PO TABS
0.5000 mg | ORAL_TABLET | Freq: Once | ORAL | 0 refills | Status: AC
  Filled 2023-10-04: qty 1, 1d supply, fill #0

## 2023-10-07 ENCOUNTER — Other Ambulatory Visit: Payer: Self-pay

## 2023-10-09 NOTE — Therapy (Signed)
 OUTPATIENT OCCUPATIONAL THERAPY TREATMENT NOTE   Patient Name: Frederick Snyder MRN: [id number] DOB:[birth year], 38 y.o., male Today's Date: 10/10/2023  PCP: Kathlene Paradise MD REFERRING PROVIDER: Sandie Cross, PA-C   END OF SESSION:  OT End of Session - 10/10/23 1554     Visit Number 3    Number of Visits 7    Date for OT Re-Evaluation 11/08/23    Authorization Type "generic"    OT Start Time 1554    OT Stop Time 1625    OT Time Calculation (min) 31 min    Activity Tolerance Patient tolerated treatment well;No increased pain;Patient limited by fatigue;Patient limited by pain    Behavior During Therapy [facility] for tasks assessed/performed               Past Medical History:  Diagnosis Date   Anemia    Anxiety    Asthma    Depression    GERD (gastroesophageal reflux disease)    hx of   History of kidney stones    Hyperlipidemia    diet controlled - no meds   Hypertension    Migraine    OSA on CPAP 05/03/2021   PONV (postoperative nausea and vomiting)    Vitamin D deficiency    Past Surgical History:  Procedure Laterality Date   CYSTOSCOPY/URETEROSCOPY/HOLMIUM LASER/STENT PLACEMENT Right 08/16/2023   Procedure: CYSTOSCOPY; RIGHT URETEROSCOPY; BASKET STONE EXTRACTION RIGHT URETERAL STENT PLACEMENT;  Surgeon: Christina Coyer, MD;  Location: [facility];  Service: Urology;  Laterality: Right;   GASTRIC ROUX-EN-Y N/A 05/28/2022   Procedure: LAPAROSCOPIC ROUX-EN-Y GASTRIC BYPASS WITH UPPER ENDOSCOPY;  Surgeon: Aldean Hummingbird, MD;  Location: [facility];  Service: General;  Laterality: N/A;   INGUINAL HERNIA REPAIR Left 12/08/2020   Procedure: OPEN HERNIA REPAIR INGUINAL ADULT;  Surgeon: Oza Blumenthal, MD;  Location: [facility];  Service: General;  Laterality: Left;  TAP BLOCK AND LMA.   INSERTION OF MESH Left 12/08/2020   Procedure: INSERTION OF MESH;  Surgeon: Oza Blumenthal, MD;  Location: [facility] OR;  Service: General;  Laterality: Left;   KNEE SURGERY     TENNIS ELBOW  RELEASE/NIRSCHEL PROCEDURE Left 08/07/2023   Procedure: LEFT TENNIS ELBOW RELEASE;  Surgeon: Wes Hamman, MD;  Location: [facility];  Service: Orthopedics;  Laterality: Left;   tubes in ears     UPPER GI ENDOSCOPY N/A 05/28/2022   Procedure: UPPER GI ENDOSCOPY;  Surgeon: Aldean Hummingbird, MD;  Location: [facility];  Service: General;  Laterality: N/A;   WISDOM TOOTH EXTRACTION     Patient Active Problem List   Diagnosis Date Noted   S/P gastric bypass 05/28/2022   Left tennis elbow 03/04/2022   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) 08/30/2021   Pure hypertriglyceridemia 05/03/2021   OSA on CPAP 05/03/2021   Prediabetes 05/03/2021   Primary hypertension 09/07/2020   Morbid obesity with BMI of 50.0-59.9, adult (HCC) 09/07/2020   Loud snoring 09/07/2020   Encounter for general adult medical examination with abnormal findings 09/07/2020   Vitamin D deficiency disease 09/07/2020   OCD (obsessive compulsive disorder) 04/06/2018   Attention deficit hyperactivity disorder (ADHD) 04/06/2018   Hyperprolactinemia (HCC) 02/26/2018   Asthma 05/25/2009    ONSET DATE: DOS 08/07/23  REFERRING DIAG: M77.12 (ICD-10-CM) - Left tennis elbow   THERAPY DIAG:  Localized edema  Muscle weakness (generalized)  Other lack of coordination  Stiffness of left elbow, not elsewhere classified  Pain in left elbow  Rationale for Evaluation and Treatment: Rehabilitation  PERTINENT  HISTORY: Ot eval and treat S/p left tennis elbow release He states having no significant pain now, but some mild pain with work activities since he is weaned from his wrist brace.Frederick Snyder  He states having 1 to 2 years of pain and problems at the elbow before having the surgery  PRECAUTIONS: None  RED FLAGS: None   WEIGHT BEARING RESTRICTIONS: Yes limit to 5 to 10 pounds or anything that is painful.    SUBJECTIVE:   SUBJECTIVE STATEMENT: Now approx 9 weeks s/p Lt lateral epicondylectomy.   He states not having  any recent exacerbations, pain has been controlled and is only very mild at worst now.    PAIN:  Are you having pain? Yes: NPRS scale: 1-2/10 at rest now, up to 3/10 at worst  Pain location: Left lateral elbow Pain description: Mildly achy Aggravating factors: Heavy weightbearing Relieving factors: Rest   PATIENT GOALS: Safely return to full function with the left dominant hand and arm  NEXT MD VISIT: As needed    OBJECTIVE: (All objective assessments below are from initial evaluation on: 09/26/23 unless otherwise specified.)   HAND DOMINANCE: LEFT  ADLs: Overall ADLs: States decreased ability to grab, hold household objects, pain and difficulty to open containers, perform FMS tasks (manipulate fasteners on clothing), mild to moderate bathing problems as well.    FUNCTIONAL OUTCOME MEASURES: Eval: Patient Specific Functional Scale: 7 (open jars, typing, playing pool)  (Higher Score  =  Better Ability for the Selected Tasks)      UPPER EXTREMITY ROM     Shoulder to Wrist AROM Left eval Lt 10/10/23  Shoulder flexion    Shoulder abduction    Shoulder extension    Shoulder internal rotation    Shoulder external rotation    Elbow flexion 141 tight 148  Elbow extension (-6)  (-2)   Forearm supination 80 83  Forearm pronation  81 85  Wrist flexion 77 75  Wrist extension 69 75 (80 in Rt)  Wrist ulnar deviation    Wrist radial deviation    Functional dart thrower's motion (F-DTM) in ulnar flexion    F-DTM in radial extension     (Blank rows = not tested)   Hand AROM Left eval  Full Fist Ability (or Gap to Distal Palmar Crease) full  Thumb Opposition  (Kapandji Scale)  9/10  Thumb MCP (0-60)   Thumb IP (0-80)   Thumb Radial Abduction Span    Thumb Palmar Abduction Span    Index MCP (0-90)    Index PIP (0-100)    Index DIP (0-70)    Long MCP (0-90)    Long PIP (0-100)    Long DIP (0-70)    Ring MCP (0-90)    Ring PIP (0-100)    Ring DIP (0-70)    Little MCP  (0-90)    Little PIP (0-100)    Little DIP (0-70)    (Blank rows = not tested)   UPPER EXTREMITY MMT:     MMT Left 10/10/23  Shoulder flexion   Shoulder abduction   Shoulder adduction   Shoulder extension   Shoulder internal rotation   Shoulder external rotation   Middle trapezius   Lower trapezius   Elbow flexion 5/5  Elbow extension 4/5  Forearm supination 5/5  Forearm pronation 5/5  Wrist flexion 5/5  Wrist extension 4-/5  Wrist ulnar deviation   Wrist radial deviation   (Blank rows = not tested)  HAND FUNCTION: Eval: No observed weakness in affected  Lt hand.  Grip strength Right: 118 lbs, Left: 91 lbs   COORDINATION: Eval: No observed coordination impairments with affected Lt hand.  SENSATION: Eval: Light touch intact today, though diminished around sx area    EDEMA:   Eval:  Mildly swollen in Lt elbow   OBSERVATIONS:   Eval: Surgical area healing well, not overly tender or hypersensitive, states some slight numbness.   TODAY'S TREATMENT:  10/10/23: He performs active range of motion for exercise as well as new measures showing excellent improvements through the arm and now within normal limit motion through the left arm.  As he does not have any major pain today or recent exacerbations, we upgraded to light functional endurance and strengthening with eccentric dynamic motions for the supinator and wrist extensors.  He also performs isotonic strengthening for the triceps with a light yellow therapy band.  These things are bolded below, and he tolerates them well after manual percussion therapy and reviewing and performing his stretch routine.  He states having some fatigue at the end of the session, but no pain.  He is asked to focus on repetitions and frequency versus increasing strength too quickly and possibly hurting himself.  He states understanding all directions   Exercises - Tricep Stretch- DO SEATED BY TABLE  - 3-4 x daily - 3 reps - 15 hold - Seated  Elbow PROM Blocked Extension  - 3-4 x daily - 3-5 reps - 20 sec hold - Wrist Prayer Stretch  - 4 x daily - 1-2 reps - 15 sec hold - Seated Wrist Flexion Stretch  - 3-4 x daily - 3 reps - 15 second  hold - Seated Eccentric Wrist Extension  - 4-6 x daily - 1 sets - 10-15 reps - Forearm Pronation and Supination with Hammer  - 1-3 x daily - 1-2 sets - 10-15 reps - CLX Tricep Pressdowns  - 4-6 x daily - 1 sets - 10-15 reps   PATIENT EDUCATION: Education details: See tx section above for details  Person educated: Patient Education method: Engineer, structural, Teach back, Handouts  Education comprehension: States and demonstrates understanding, Additional Education required    HOME EXERCISE PROGRAM: Access Code: [email or web address] URL: [email or web address] Date: 09/26/2023 Prepared by: Leartis Proud   GOALS: Goals reviewed with patient? Yes   SHORT TERM GOALS: (STG required if POC>30 days) Target Date: 10/04/2023  Pt will demo/state understanding of initial HEP to improve pain levels and prerequisite motion. Goal status: 10/03/23: MET   LONG TERM GOALS: Target Date: 11/08/2023  Pt will improve functional ability by decreased impairment per PSFS  assessment from 7 to 9 or better, for better quality of life. Goal status: INITIAL  2.  Pt will improve A/ROM in left wrist extension from 69 degrees to at least 73 degrees, to have functional motion for tasks like reach and grasp.  Goal status: INITIAL  3.  Pt will improve strength in left elbow extension and wrist extension from apparent 3+/5 MMT to at least 4+/5 MMT to have increased functional ability to carry out selfcare and higher-level homecare tasks with less difficulty. Goal status: INITIAL  4.  Pt will decrease pain at worst from 3/10 to "none significant" to have better sleep and occupational participation in daily roles. Goal status: INITIAL   ASSESSMENT:  CLINICAL IMPRESSION: 10/10/23: Now tolerating light eccentric  strengthening for the wrist extensors and concentric strengthening for the triceps.  Has not had a flareup recently, and his motion is within normal limits now.  He  may only need 1-2 more visits before he meets all his goals and can safely discharge.   PLAN:  OT FREQUENCY: 1x/week  OT DURATION: 6 weeks through 11/08/2023 and up to 7 total visits as needed  PLANNED INTERVENTIONS: 97168 OT Re-evaluation, 97535 self care/ADL training, 95621 therapeutic exercise, 97530 therapeutic activity, 97112 neuromuscular re-education, 97140 manual therapy, 97010 moist heat, 97010 cryotherapy, passive range of motion, compression bandaging, and patient/family education   CONSULTED AND AGREED WITH PLAN OF CARE: Patient  PLAN FOR NEXT SESSION:   Check eccentric strengthening and tricep strengthening, consider performing a progress note and discharging if all goals are met, though this would have to be done cautiously as he should still be careful with weightbearing until 3 months postop.   Leartis Proud, OTR/L, CHT 10/10/2023, 4:40 PM

## 2023-10-10 ENCOUNTER — Encounter: Payer: Self-pay | Admitting: Rehabilitative and Restorative Service Providers"

## 2023-10-10 ENCOUNTER — Ambulatory Visit: Payer: PRIVATE HEALTH INSURANCE | Admitting: Rehabilitative and Restorative Service Providers"

## 2023-10-10 DIAGNOSIS — R278 Other lack of coordination: Secondary | ICD-10-CM | POA: Diagnosis not present

## 2023-10-10 DIAGNOSIS — R600 Localized edema: Secondary | ICD-10-CM

## 2023-10-10 DIAGNOSIS — M25522 Pain in left elbow: Secondary | ICD-10-CM

## 2023-10-10 DIAGNOSIS — M25622 Stiffness of left elbow, not elsewhere classified: Secondary | ICD-10-CM

## 2023-10-10 DIAGNOSIS — M6281 Muscle weakness (generalized): Secondary | ICD-10-CM

## 2023-10-16 ENCOUNTER — Other Ambulatory Visit (HOSPITAL_COMMUNITY): Payer: Self-pay

## 2023-10-16 ENCOUNTER — Other Ambulatory Visit: Payer: Self-pay

## 2023-10-16 NOTE — Therapy (Signed)
 OUTPATIENT OCCUPATIONAL THERAPY TREATMENT NOTE   Patient Name: Frederick Snyder MRN: [id number] DOB:March 12, 1986, 37 y.o., male Today's Date: 10/16/2023  PCP: Kathlene Paradise MD REFERRING PROVIDER: Sandie Cross, PA-C   END OF SESSION:      Past Medical History:  Diagnosis Date   Anemia    Anxiety    Asthma    Depression    GERD (gastroesophageal reflux disease)    hx of   History of kidney stones    Hyperlipidemia    diet controlled - no meds   Hypertension    Migraine    OSA on CPAP 05/03/2021   PONV (postoperative nausea and vomiting)    Vitamin D deficiency    Past Surgical History:  Procedure Laterality Date   CYSTOSCOPY/URETEROSCOPY/HOLMIUM LASER/STENT PLACEMENT Right 08/16/2023   Procedure: CYSTOSCOPY; RIGHT URETEROSCOPY; BASKET STONE EXTRACTION RIGHT URETERAL STENT PLACEMENT;  Surgeon: Christina Coyer, MD;  Location: [facility];  Service: Urology;  Laterality: Right;   GASTRIC ROUX-EN-Y N/A 05/28/2022   Procedure: LAPAROSCOPIC ROUX-EN-Y GASTRIC BYPASS WITH UPPER ENDOSCOPY;  Surgeon: Aldean Hummingbird, MD;  Location: [facility];  Service: General;  Laterality: N/A;   INGUINAL HERNIA REPAIR Left 12/08/2020   Procedure: OPEN HERNIA REPAIR INGUINAL ADULT;  Surgeon: Oza Blumenthal, MD;  Location: [facility];  Service: General;  Laterality: Left;  TAP BLOCK AND LMA.   INSERTION OF MESH Left 12/08/2020   Procedure: INSERTION OF MESH;  Surgeon: Oza Blumenthal, MD;  Location: [facility] OR;  Service: General;  Laterality: Left;   KNEE SURGERY     TENNIS ELBOW RELEASE/NIRSCHEL PROCEDURE Left 08/07/2023   Procedure: LEFT TENNIS ELBOW RELEASE;  Surgeon: Wes Hamman, MD;  Location: [facility];  Service: Orthopedics;  Laterality: Left;   tubes in ears     UPPER GI ENDOSCOPY N/A 05/28/2022   Procedure: UPPER GI ENDOSCOPY;  Surgeon: Aldean Hummingbird, MD;  Location: [facility];  Service: General;  Laterality: N/A;   WISDOM TOOTH EXTRACTION     Patient Active Problem List   Diagnosis Date  Noted   S/P gastric bypass 05/28/2022   Left tennis elbow 03/04/2022   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 [facility]) 08/30/2021   Pure hypertriglyceridemia 05/03/2021   OSA on CPAP 05/03/2021   Prediabetes 05/03/2021   Primary hypertension 09/07/2020   Morbid obesity with BMI of 50.0-59.9, adult (HCC) 09/07/2020   Loud snoring 09/07/2020   Encounter for general adult medical examination with abnormal findings 09/07/2020   Vitamin D deficiency disease 09/07/2020   OCD (obsessive compulsive disorder) 04/06/2018   Attention deficit hyperactivity disorder (ADHD) 04/06/2018   Hyperprolactinemia (HCC) 02/26/2018   Asthma 05/25/2009    ONSET DATE: DOS 08/07/23  REFERRING DIAG: M77.12 (ICD-10-CM) - Left tennis elbow   THERAPY DIAG:  No diagnosis found.  Rationale for Evaluation and Treatment: Rehabilitation  PERTINENT HISTORY: Ot eval and treat S/p left tennis elbow release He states having no significant pain now, but some mild pain with work activities since he is weaned from his wrist brace.Frederick Snyder  He states having 1 to 2 years of pain and problems at the elbow before having the surgery  PRECAUTIONS: None  RED FLAGS: None   WEIGHT BEARING RESTRICTIONS: Yes limit to 5 to 10 pounds or anything that is painful.    SUBJECTIVE:   SUBJECTIVE STATEMENT: Now approx 10 weeks s/p Lt lateral epicondylectomy.   He states ***  not having any recent exacerbations, pain has been controlled and is only very mild at worst now.  PAIN:  Are you having pain? Yes: NPRS scale: *** 1-2/10 at rest now, up to 3/10 at worst  Pain location: Left lateral elbow Pain description: Mildly achy Aggravating factors: Heavy weightbearing Relieving factors: Rest   PATIENT GOALS: Safely return to full function with the left dominant hand and arm  NEXT MD VISIT: As needed    OBJECTIVE: (All objective assessments below are from initial evaluation on: 09/26/23 unless otherwise specified.)   HAND  DOMINANCE: LEFT  ADLs: Overall ADLs: States decreased ability to grab, hold household objects, pain and difficulty to open containers, perform FMS tasks (manipulate fasteners on clothing), mild to moderate bathing problems as well.    FUNCTIONAL OUTCOME MEASURES: Eval: Patient Specific Functional Scale: 7 (open jars, typing, playing pool)  (Higher Score  =  Better Ability for the Selected Tasks)      UPPER EXTREMITY ROM     Shoulder to Wrist AROM Left eval Lt 10/10/23  Shoulder flexion    Shoulder abduction    Shoulder extension    Shoulder internal rotation    Shoulder external rotation    Elbow flexion 141 tight 148  Elbow extension (-6)  (-2)   Forearm supination 80 83  Forearm pronation  81 85  Wrist flexion 77 75  Wrist extension 69 75 (80 in Rt)  Wrist ulnar deviation    Wrist radial deviation    Functional dart thrower's motion (F-DTM) in ulnar flexion    F-DTM in radial extension     (Blank rows = not tested)   Hand AROM Left eval  Full Fist Ability (or Gap to Distal Palmar Crease) full  Thumb Opposition  (Kapandji Scale)  9/10  Thumb MCP (0-60)   Thumb IP (0-80)   Thumb Radial Abduction Span    Thumb Palmar Abduction Span    Index MCP (0-90)    Index PIP (0-100)    Index DIP (0-70)    Long MCP (0-90)    Long PIP (0-100)    Long DIP (0-70)    Ring MCP (0-90)    Ring PIP (0-100)    Ring DIP (0-70)    Little MCP (0-90)    Little PIP (0-100)    Little DIP (0-70)    (Blank rows = not tested)   UPPER EXTREMITY MMT:     MMT Left 10/10/23  Shoulder flexion   Shoulder abduction   Shoulder adduction   Shoulder extension   Shoulder internal rotation   Shoulder external rotation   Middle trapezius   Lower trapezius   Elbow flexion 5/5  Elbow extension 4/5  Forearm supination 5/5  Forearm pronation 5/5  Wrist flexion 5/5  Wrist extension 4-/5  Wrist ulnar deviation   Wrist radial deviation   (Blank rows = not tested)  HAND FUNCTION: Eval: No  observed weakness in affected Lt hand.  Grip strength Right: 118 lbs, Left: 91 lbs   COORDINATION: Eval: No observed coordination impairments with affected Lt hand.  SENSATION: Eval: Light touch intact today, though diminished around sx area    EDEMA:   Eval:  Mildly swollen in Lt elbow   OBSERVATIONS:   Eval: Surgical area healing well, not overly tender or hypersensitive, states some slight numbness.   TODAY'S TREATMENT:  10/17/23: *** Check eccentric strengthening and tricep strengthening, consider performing a progress note and discharging if all goals are met, though this would have to be done cautiously as he should still be careful with weightbearing until 3 months postop.  10/10/23: He performs active range of motion for exercise as well as new measures showing excellent improvements through the arm and now within normal limit motion through the left arm.  As he does not have any major pain today or recent exacerbations, we upgraded to light functional endurance and strengthening with eccentric dynamic motions for the supinator and wrist extensors.  He also performs isotonic strengthening for the triceps with a light yellow therapy band.  These things are bolded below, and he tolerates them well after manual percussion therapy and reviewing and performing his stretch routine.  He states having some fatigue at the end of the session, but no pain.  He is asked to focus on repetitions and frequency versus increasing strength too quickly and possibly hurting himself.  He states understanding all directions   Exercises - Tricep Stretch- DO SEATED BY TABLE  - 3-4 x daily - 3 reps - 15 hold - Seated Elbow PROM Blocked Extension  - 3-4 x daily - 3-5 reps - 20 sec hold - Wrist Prayer Stretch  - 4 x daily - 1-2 reps - 15 sec hold - Seated Wrist Flexion Stretch  - 3-4 x daily - 3 reps - 15 second  hold - Seated Eccentric Wrist Extension  - 4-6 x daily - 1 sets - 10-15 reps - Forearm  Pronation and Supination with Hammer  - 1-3 x daily - 1-2 sets - 10-15 reps - CLX Tricep Pressdowns  - 4-6 x daily - 1 sets - 10-15 reps   PATIENT EDUCATION: Education details: See tx section above for details  Person educated: Patient Education method: Engineer, structural, Teach back, Handouts  Education comprehension: States and demonstrates understanding, Additional Education required    HOME EXERCISE PROGRAM: Access Code: [email or web address] URL: [email or web address] Date: 09/26/2023 Prepared by: Leartis Proud   GOALS: Goals reviewed with patient? Yes   SHORT TERM GOALS: (STG required if POC>30 days) Target Date: 10/04/2023  Pt will demo/state understanding of initial HEP to improve pain levels and prerequisite motion. Goal status: 10/03/23: MET   LONG TERM GOALS: Target Date: 11/08/2023  Pt will improve functional ability by decreased impairment per PSFS  assessment from 7 to 9 or better, for better quality of life. Goal status: 10/17/23: ***  2.  Pt will improve A/ROM in left wrist extension from 69 degrees to at least 73 degrees, to have functional motion for tasks like reach and grasp.  Goal status: INITIAL  3.  Pt will improve strength in left elbow extension and wrist extension from apparent 3+/5 MMT to at least 4+/5 MMT to have increased functional ability to carry out selfcare and higher-level homecare tasks with less difficulty. Goal status: INITIAL  4.  Pt will decrease pain at worst from 3/10 to "none significant" to have better sleep and occupational participation in daily roles. Goal status: INITIAL   ASSESSMENT:  CLINICAL IMPRESSION: 10/17/23: ***  10/10/23: Now tolerating light eccentric strengthening for the wrist extensors and concentric strengthening for the triceps.  Has not had a flareup recently, and his motion is within normal limits now.  He may only need 1-2 more visits before he meets all his goals and can safely discharge.   PLAN:  OT  FREQUENCY: 1x/week  OT DURATION: 6 weeks through 11/08/2023 and up to 7 total visits as needed  PLANNED INTERVENTIONS: 97168 OT Re-evaluation, 97535 self care/ADL training, 16109 therapeutic exercise, 97530 therapeutic activity, 97112 neuromuscular re-education, 97140 manual therapy, 97010 moist heat, 97010 cryotherapy, passive range of  motion, compression bandaging, and patient/family education   CONSULTED AND AGREED WITH PLAN OF CARE: Patient  PLAN FOR NEXT SESSION:   ***   Leartis Proud, OTR/L, CHT 10/16/2023, 9:28 AM

## 2023-10-17 ENCOUNTER — Ambulatory Visit (INDEPENDENT_AMBULATORY_CARE_PROVIDER_SITE_OTHER): Payer: PRIVATE HEALTH INSURANCE | Admitting: Rehabilitative and Restorative Service Providers"

## 2023-10-17 ENCOUNTER — Encounter: Payer: Self-pay | Admitting: Rehabilitative and Restorative Service Providers"

## 2023-10-17 DIAGNOSIS — R278 Other lack of coordination: Secondary | ICD-10-CM | POA: Diagnosis not present

## 2023-10-17 DIAGNOSIS — M25622 Stiffness of left elbow, not elsewhere classified: Secondary | ICD-10-CM | POA: Diagnosis not present

## 2023-10-17 DIAGNOSIS — M6281 Muscle weakness (generalized): Secondary | ICD-10-CM

## 2023-10-17 DIAGNOSIS — R600 Localized edema: Secondary | ICD-10-CM

## 2023-10-17 DIAGNOSIS — M25522 Pain in left elbow: Secondary | ICD-10-CM

## 2023-10-21 ENCOUNTER — Ambulatory Visit
Admission: RE | Admit: 2023-10-21 | Discharge: 2023-10-21 | Disposition: A | Discharge status: Home / Self Care | Payer: PRIVATE HEALTH INSURANCE | Source: Ambulatory Visit | Attending: Family Medicine | Admitting: Family Medicine

## 2023-10-21 ENCOUNTER — Other Ambulatory Visit (HOSPITAL_COMMUNITY): Payer: Self-pay

## 2023-10-21 ENCOUNTER — Other Ambulatory Visit: Payer: Self-pay

## 2023-10-21 VITALS — BP 123/85 | HR 69 | Temp 98.2°F | Resp 18

## 2023-10-21 DIAGNOSIS — K644 Residual hemorrhoidal skin tags: Secondary | ICD-10-CM | POA: Diagnosis not present

## 2023-10-21 MED ORDER — HYDROCORTISONE ACETATE 25 MG RE SUPP
25.0000 mg | Freq: Two times a day (BID) | RECTAL | 0 refills | Status: DC
  Filled 2023-10-21 (×2): qty 12, 6d supply, fill #0

## 2023-10-21 MED ORDER — HYDROCORTISONE (PERIANAL) 2.5 % EX CREA
1.0000 | TOPICAL_CREAM | Freq: Two times a day (BID) | CUTANEOUS | 0 refills | Status: DC
  Filled 2023-10-21 (×2): qty 30, 10d supply, fill #0

## 2023-10-21 NOTE — ED Provider Notes (Addendum)
 [facility]    CSN: [id number] Arrival date & time: 10/21/23  1243      History   Chief Complaint Chief Complaint  Patient presents with   Hemorrhoids    I have a large and painful hemorrhoid that appeared this past Friday and has been bothering me all weekend. I have been unsuccessful in being to able get lasting or effective pain relief. - Entered by patient    HPI Frederick Snyder is a 38 y.o. male presents for hemorrhoids.  Patient reports a history of hemorrhoids that typically resolve on their own or can be treated with OTC.  He states last week he had 1 that seemed to resolve but then 2 days ago it flared again.  States he has been having some discomfort but denies any bleeding or pain with defecation.  Denies constipation or straining.  He has been using over-the-counter hemorrhoid treatments with some improvement.  No other concerns at this time.  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/30/2021   Pure hypertriglyceridemia 05/03/2021   OSA on CPAP 05/03/2021   Prediabetes 05/03/2021   Primary hypertension 09/07/2020   Morbid obesity with BMI of 50.0-59.9, adult (HCC) 09/07/2020   Loud snoring 09/07/2020   Encounter for general adult medical examination with abnormal findings 09/07/2020   Vitamin D deficiency disease 09/07/2020   OCD (obsessive compulsive disorder) 04/06/2018   Attention deficit hyperactivity disorder (ADHD) 04/06/2018   Hyperprolactinemia (HCC) 02/26/2018   Asthma  05/25/2009    Past Surgical History:  Procedure Laterality Date   CYSTOSCOPY/URETEROSCOPY/HOLMIUM LASER/STENT PLACEMENT Right 08/16/2023   Procedure: CYSTOSCOPY; RIGHT URETEROSCOPY; BASKET STONE EXTRACTION RIGHT URETERAL STENT PLACEMENT;  Surgeon: Christina Coyer, MD;  Location: [facility];  Service: Urology;  Laterality: Right;   GASTRIC ROUX-EN-Y N/A 05/28/2022   Procedure: LAPAROSCOPIC ROUX-EN-Y GASTRIC BYPASS WITH UPPER ENDOSCOPY;  Surgeon: Aldean Hummingbird, MD;  Location: [facility];  Service: General;  Laterality: N/A;   INGUINAL HERNIA REPAIR Left 12/08/2020   Procedure: OPEN HERNIA REPAIR INGUINAL ADULT;  Surgeon: Oza Blumenthal, MD;  Location: [facility];  Service: General;  Laterality: Left;  TAP BLOCK AND LMA.   INSERTION OF MESH Left 12/08/2020   Procedure: INSERTION OF MESH;  Surgeon: Oza Blumenthal, MD;  Location: [facility] OR;  Service: General;  Laterality: Left;   KNEE SURGERY     TENNIS ELBOW RELEASE/NIRSCHEL PROCEDURE Left 08/07/2023   Procedure: LEFT TENNIS ELBOW RELEASE;  Surgeon: Wes Hamman, MD;  Location: [facility];  Service: Orthopedics;  Laterality: Left;   tubes in ears     UPPER GI ENDOSCOPY N/A 05/28/2022   Procedure: UPPER GI ENDOSCOPY;  Surgeon: Aldean Hummingbird, MD;  Location: [facility];  Service: General;  Laterality: N/A;   WISDOM TOOTH EXTRACTION         Home Medications    Prior to Admission medications   Medication Sig  Start Date End Date Taking? Authorizing Provider  hydrocortisone (ANUSOL-HC) 2.5 % rectal cream Place 1 Application rectally 2 (two) times daily. 10/21/23  Yes Dow Blahnik, Jodi R, NP  hydrocortisone (ANUSOL-HC) 25 MG suppository Place 1 suppository (25 mg total) rectally 2 (two) times daily. 10/21/23  Yes Chelsia Serres, Jodi R, NP  amphetamine -dextroamphetamine  (ADDERALL) 30 MG tablet Take 1 tablet by mouth 2 (two) times daily. 09/18/23   Lincoln Renshaw, NP  CALCIUM PO Take 1 tablet by mouth in the morning, at noon, and at bedtime.    [provider]   cephALEXin  (KEFLEX ) 500 MG capsule Take 1 capsule (500 mg total) by mouth 3 (three) times daily. 08/16/23   Christina Coyer, MD  HYDROcodone -acetaminophen  (NORCO) 5-325 MG tablet Take 1 tablet by mouth 3 (three) times daily as needed. To be taken after surgery Patient not taking: Reported on 08/16/2023 07/18/23   Sandie Cross, PA-C  Multiple Vitamin (MULTIVITAMIN) tablet Take 1 tablet by mouth daily.    [provider]  ondansetron  (ZOFRAN ) 4 MG tablet Take 1 tablet (4 mg total) by mouth every 8 (eight) hours as needed for nausea or vomiting. Patient taking differently: Take 4 mg by mouth daily as needed for nausea or vomiting. 07/18/23   Sandie Cross, PA-C  ondansetron  (ZOFRAN -ODT) 4 MG disintegrating tablet Dissolve 1 tablet (4 mg total) by mouth every 6 (six) hours as needed for nausea/vomiting. Patient taking differently: Take 4 mg by mouth daily as needed for nausea or vomiting. 08/14/23     oxyCODONE  (OXY IR/ROXICODONE ) 5 MG immediate release tablet Take 1 tablet (5 mg total) by mouth every 6 (six) hours as needed. Patient taking differently: Take 5 mg by mouth daily as needed for moderate pain (pain score 4-6) or severe pain (pain score 7-10). 08/14/23     sertraline  (ZOLOFT ) 100 MG tablet Take 1 tablet (100 mg total) by mouth daily. 08/28/23   Lincoln Renshaw, NP  tamsulosin  (FLOMAX ) 0.4 MG CAPS capsule Take 1 capsule (0.4 mg total) by mouth daily. 08/14/23     valsartan  (DIOVAN ) 320 MG tablet Take 1 tablet (320 mg total) by mouth daily. 08/12/23   Arcadio Knuckles, MD    Family History Family History  Problem Relation Age of Onset   Diabetes Mother    Heart disease Mother    Sleep apnea Mother    Diabetes Father    Heart disease Father    Hypertension Father    Sleep apnea Father    Diabetes Other    Hyperlipidemia Other    Hypertension Other    Other Neg Hx        hypogonadism    Social History Social History   Tobacco Use   Smoking status: Never   Smokeless tobacco:  Never   Tobacco comments:    Quit in approx 2007  Vaping Use   Vaping status: Never Used  Substance Use Topics   Alcohol use: No   Drug use: No     Allergies   Nitrous oxide and Nsaids   Review of Systems Review of Systems  Gastrointestinal:  Positive for rectal pain.     Physical Exam Triage Vital Signs ED Triage Vitals  Encounter Vitals Group     BP 10/21/23 1309 123/85     Systolic BP Percentile --      Diastolic BP Percentile --      Pulse Rate 10/21/23 1309 69     Resp 10/21/23 1309 18  Temp 10/21/23 1309 98.2 F (36.8 C)     Temp Source 10/21/23 1309 Oral     SpO2 10/21/23 1309 97 %     Weight --      Height --      Head Circumference --      Peak Flow --      Pain Score 10/21/23 1308 8     Pain Loc --      Pain Education --      Exclude from Growth Chart --    No data found.  Updated Vital Signs BP 123/85 (BP Location: Left Arm)   Pulse 69   Temp 98.2 F (36.8 C) (Oral)   Resp 18   SpO2 97%   Visual Acuity Right Eye Distance:   Left Eye Distance:   Bilateral Distance:    Right Eye Near:   Left Eye Near:    Bilateral Near:     Physical Exam Vitals and nursing note reviewed. Exam conducted with a chaperone present Editor, commissioning).  Constitutional:      General: He is not in acute distress.    Appearance: Normal appearance. He is not ill-appearing.  HENT:     Head: Normocephalic and atraumatic.  Eyes:     Pupils: Pupils are equal, round, and reactive to light.  Cardiovascular:     Rate and Rhythm: Normal rate.  Pulmonary:     Effort: Pulmonary effort is normal.  Genitourinary:    Rectum: External hemorrhoid present.       Comments: Nonthrombosed external hemorrhoid mildly tender to palpation Skin:    General: Skin is warm and dry.  Neurological:     General: No focal deficit present.     Mental Status: He is alert and oriented to person, place, and time.  Psychiatric:        Mood and Affect: Mood normal.        Behavior:  Behavior normal.      UC Treatments / Results  Labs (all labs ordered are listed, but only abnormal results are displayed) Labs Reviewed - No data to display  EKG   Radiology No results found.  Procedures Procedures (including critical care time)  Medications Ordered in UC Medications - No data to display  Initial Impression / Assessment and Plan / UC Course  I have reviewed the triage vital signs and the nursing notes.  Pertinent labs & imaging results that were available during my care of the patient were reviewed by me and considered in my medical decision making (see chart for details).     Reviewed exam and symptoms with patient.  No red flags.  Will start Anusol suppositories and topical as needed.  Advised witch hazel pads and to avoid straining/increasing fiber.  Will have him contact gastroenterology for further treatment options.  ER precautions reviewed and patient verbalized understanding. Final Clinical Impressions(s) / UC Diagnoses   Final diagnoses:  Inflamed external hemorrhoid     Discharge Instructions      Start Anusol suppositories as well as Anusol topical cream.  You may continue over-the-counter lidocaine  as needed.  May also use witch hazel pads for comfort.  Avoid straining and eat lots of fiber.  Please contact gastroenterology to set up an appointment for further evaluation and treatment of your hemorrhoid.  Please go to the ER if you develop any worsening symptoms.  Hope you feel better soon!  ED Prescriptions     Medication Sig Dispense Auth. Provider   hydrocortisone (ANUSOL-HC)  25 MG suppository Place 1 suppository (25 mg total) rectally 2 (two) times daily. 12 suppository Dearra Myhand, Jodi R, NP   hydrocortisone (ANUSOL-HC) 2.5 % rectal cream Place 1 Application rectally 2 (two) times daily. 30 g Gianny Killman, Jodi R, NP      PDMP not reviewed this encounter.   Alleen Arbour, NP 10/21/23 1337    Alleen Arbour, NP 10/21/23 [phone]

## 2023-10-21 NOTE — Discharge Instructions (Signed)
 Start Anusol suppositories as well as Anusol topical cream.  You may continue over-the-counter lidocaine  as needed.  May also use witch hazel pads for comfort.  Avoid straining and eat lots of fiber.  Please contact gastroenterology to set up an appointment for further evaluation and treatment of your hemorrhoid.  Please go to the ER if you develop any worsening symptoms.  Hope you feel better soon!

## 2023-10-21 NOTE — ED Triage Notes (Signed)
 Pt present with c/o rectal pain. Onset of possible hemorrhoid Friday. C/o pain and itching. Has not been able to get relief.

## 2023-10-22 ENCOUNTER — Encounter: Payer: Self-pay | Admitting: Gastroenterology

## 2023-11-11 ENCOUNTER — Other Ambulatory Visit: Payer: Self-pay

## 2023-11-11 ENCOUNTER — Other Ambulatory Visit: Payer: Self-pay | Admitting: Internal Medicine

## 2023-11-11 ENCOUNTER — Other Ambulatory Visit (HOSPITAL_COMMUNITY): Payer: Self-pay

## 2023-11-11 ENCOUNTER — Other Ambulatory Visit: Payer: Self-pay | Admitting: Behavioral Health

## 2023-11-11 DIAGNOSIS — I10 Essential (primary) hypertension: Secondary | ICD-10-CM

## 2023-11-11 DIAGNOSIS — F902 Attention-deficit hyperactivity disorder, combined type: Secondary | ICD-10-CM

## 2023-11-12 ENCOUNTER — Other Ambulatory Visit (HOSPITAL_COMMUNITY): Payer: Self-pay

## 2023-11-12 ENCOUNTER — Other Ambulatory Visit: Payer: Self-pay

## 2023-11-12 MED ORDER — AMPHETAMINE-DEXTROAMPHETAMINE 30 MG PO TABS
30.0000 mg | ORAL_TABLET | Freq: Two times a day (BID) | ORAL | 0 refills | Status: DC
  Filled 2023-11-12: qty 60, 30d supply, fill #0

## 2023-11-26 ENCOUNTER — Ambulatory Visit: Payer: PRIVATE HEALTH INSURANCE | Admitting: Gastroenterology

## 2023-11-26 NOTE — Progress Notes (Deleted)
 Frederick Snyder [id number] 16-Aug-1985   Chief Complaint: hemorrhoids  Referring Provider: Arcadio Knuckles, MD Primary GI MD: Para Bold  HPI: Frederick Snyder is a 38 y.o. 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    10/21/2023 patient seen at urgent care for complaint of painful hemorrhoid.  He reported history of hemorrhoids that typically resolve on their own or can be treated with OTC remedies, but this time had minimal improvement.  He denied any bleeding or pain with defecation, no constipation or straining.  On exam he was found to have a nonthrombosed external hemorrhoid which was mildly tender to palpation.  He was prescribed Anusol  suppositories and topical hydrocortisone  cream to use as needed.   Previous GI Procedures/Imaging   CT A/P 08/16/2023 1. Mild-to-moderate interval worsened right hydroureteronephrosis above a 1 mm distal ureteral stone just under 1 cm from the UVJ. The position of the stone has not changed since 3 days ago. 2. Small amount of fluid developed underneath the right kidney which could be due to the obstructive uropathy or could indicate an early caliceal leak. 3. Asymmetric right cortical contrast retention and faint hypoenhancement over portions of the right renal cortex, the latter concerning for pyelonephritis. 4. Hepatosplenomegaly with moderate hepatic steatosis. 5. Aortic atherosclerosis. 6. 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 7. Small sliding hiatus hernia.   EGD 05/28/2022 (Dr. Aldean Hummingbird)   Past Medical History:  Diagnosis Date   Anemia    Anxiety    Asthma    Depression    GERD (gastroesophageal reflux disease)    hx of   History of kidney stones    Hyperlipidemia    diet controlled -  no meds   Hypertension    Migraine    OSA on CPAP 05/03/2021   PONV (postoperative nausea and vomiting)    Vitamin D deficiency     Past Surgical History:  Procedure Laterality Date   CYSTOSCOPY/URETEROSCOPY/HOLMIUM LASER/STENT PLACEMENT Right 08/16/2023   Procedure: CYSTOSCOPY; RIGHT URETEROSCOPY; BASKET STONE EXTRACTION RIGHT URETERAL STENT PLACEMENT;  Surgeon: Christina Coyer, MD;  Location: [facility];  Service: Urology;  Laterality: Right;   GASTRIC ROUX-EN-Y N/A 05/28/2022   Procedure: LAPAROSCOPIC ROUX-EN-Y GASTRIC BYPASS WITH UPPER ENDOSCOPY;  Surgeon: Aldean Hummingbird, MD;  Location: [facility];  Service: General;  Laterality: N/A;   INGUINAL HERNIA REPAIR Left 12/08/2020   Procedure: OPEN HERNIA REPAIR INGUINAL ADULT;  Surgeon: Oza Blumenthal, MD;  Location: [facility];  Service: General;  Laterality: Left;  TAP BLOCK AND LMA.   INSERTION OF MESH Left 12/08/2020   Procedure: INSERTION OF MESH;  Surgeon: Oza Blumenthal, MD;  Location[facility];  Service: General;  Laterality: Left;   KNEE SURGERY     TENNIS ELBOW RELEASE/NIRSCHEL PROCEDURE Left 08/07/2023   Procedure: LEFT TENNIS ELBOW RELEASE;  Surgeon: Wes Hamman, MD;  Location: [facility];  Service: Orthopedics;  Laterality: Left;   tubes in ears     UPPER GI ENDOSCOPY N/A 05/28/2022   Procedure: UPPER GI ENDOSCOPY;  Surgeon: Aldean Hummingbird, MD;  Location: [facility];  Service: General;  Laterality: N/A;   WISDOM TOOTH EXTRACTION      Current Outpatient Medications  Medication Sig Dispense Refill   amphetamine -dextroamphetamine  (ADDERALL) 30 MG tablet Take 1 tablet by mouth 2 (two) times daily. 60  tablet 0   CALCIUM PO Take 1 tablet by mouth in the morning, at noon, and at bedtime.     cephALEXin  (KEFLEX ) 500 MG capsule Take 1 capsule (500 mg total) by mouth 3 (three) times daily. 21 capsule 0   HYDROcodone -acetaminophen  (NORCO) 5-325 MG tablet Take 1 tablet by mouth 3 (three) times daily as needed. To be taken after surgery  (Patient not taking: Reported on 08/16/2023) 20 tablet 0   hydrocortisone  (ANUSOL -HC) 2.5 % rectal cream Place rectally 2 (two) times daily as directed 30 g 0   hydrocortisone  (ANUSOL -HC) 25 MG suppository Place 1 suppository (25 mg total) rectally 2 (two) times daily. 12 suppository 0   Multiple Vitamin (MULTIVITAMIN) tablet Take 1 tablet by mouth daily.     ondansetron  (ZOFRAN ) 4 MG tablet Take 1 tablet (4 mg total) by mouth every 8 (eight) hours as needed for nausea or vomiting. (Patient taking differently: Take 4 mg by mouth daily as needed for nausea or vomiting.) 40 tablet 0   ondansetron  (ZOFRAN -ODT) 4 MG disintegrating tablet Dissolve 1 tablet (4 mg total) by mouth every 6 (six) hours as needed for nausea/vomiting. (Patient taking differently: Take 4 mg by mouth daily as needed for nausea or vomiting.) 30 tablet 0   oxyCODONE  (OXY IR/ROXICODONE ) 5 MG immediate release tablet Take 1 tablet (5 mg total) by mouth every 6 (six) hours as needed. (Patient taking differently: Take 5 mg by mouth daily as needed for moderate pain (pain score 4-6) or severe pain (pain score 7-10).) 30 tablet 0   sertraline  (ZOLOFT ) 100 MG tablet Take 1 tablet (100 mg total) by mouth daily. 90 tablet 1   tamsulosin  (FLOMAX ) 0.4 MG CAPS capsule Take 1 capsule (0.4 mg total) by mouth daily. 20 capsule 0   valsartan  (DIOVAN ) 320 MG tablet Take 1 tablet (320 mg total) by mouth daily. 90 tablet 0   No current facility-administered medications for this visit.    Allergies as of 11/26/2023 - Review Complete 10/21/2023  Allergen Reaction Noted   Nitrous oxide Nausea And Vomiting 07/16/2023   Nsaids  08/07/2023    Family History  Problem Relation Age of Onset   Diabetes Mother    Heart disease Mother    Sleep apnea Mother    Diabetes Father    Heart disease Father    Hypertension Father    Sleep apnea Father    Diabetes Other    Hyperlipidemia Other    Hypertension Other    Other Neg Hx        hypogonadism     Social History   Tobacco Use   Smoking status: Never   Smokeless tobacco: Never   Tobacco comments:    Quit in approx 2007  Vaping Use   Vaping status: Never Used  Substance Use Topics   Alcohol use: No   Drug use: No     Review of Systems:    Constitutional: No weight loss, fever, chills, weakness or fatigue Eyes: No change in vision Ears, Nose, Throat:  No change in hearing or congestion Skin: No rash or itching Cardiovascular: No chest pain, chest pressure or palpitations   Respiratory: No SOB or cough Gastrointestinal: See HPI and otherwise negative Genitourinary: No dysuria or change in urinary frequency Neurological: No headache, dizziness or syncope Musculoskeletal: No new muscle or joint pain Hematologic: No bleeding or bruising    Physical Exam:  Vital signs: There were no vitals taken for this visit.  Constitutional: NAD, Well developed, Well  nourished, alert and cooperative Head:  Normocephalic and atraumatic.  Eyes: No scleral icterus. Conjunctiva pink. Mouth: No oral lesions. Respiratory: Respirations even and unlabored. Lungs clear to auscultation bilaterally.  No wheezes, crackles, or rhonchi.  Cardiovascular:  Regular rate and rhythm. No murmurs. No peripheral edema. Gastrointestinal:  Soft, nondistended, nontender. No rebound or guarding. Normal bowel sounds. No appreciable masses or hepatomegaly. Rectal:  Not performed.  Neurologic:  Alert and oriented x4;  grossly normal neurologically.  Skin:   Dry and intact without significant lesions or rashes. Psychiatric: Oriented to person, place and time. Demonstrates good judgement and reason without abnormal affect or behaviors.   RELEVANT LABS AND IMAGING: CBC    Component Value Date/Time   WBC 12.3 (H) 08/15/2023 2325   RBC 4.80 08/15/2023 2325   HGB 13.8 08/15/2023 2325   HCT 41.5 08/15/2023 2325   PLT 250 08/15/2023 2325   MCV 86.5 08/15/2023 2325   MCH 28.8 08/15/2023 2325   MCHC 33.3  08/15/2023 2325   RDW 13.0 08/15/2023 2325   LYMPHSABS 2.6 05/29/2022 0521   MONOABS 0.7 05/29/2022 0521   EOSABS 0.0 05/29/2022 0521   BASOSABS 0.0 05/29/2022 0521    CMP     Component Value Date/Time   NA 131 (L) 08/15/2023 2325   K 3.5 08/15/2023 2325   CL 99 08/15/2023 2325   CO2 22 08/15/2023 2325   GLUCOSE 114 (H) 08/15/2023 2325   BUN 14 08/15/2023 2325   CREATININE 1.46 (H) 08/15/2023 2325   CALCIUM 8.6 (L) 08/15/2023 2325   PROT 7.0 08/16/2023 0049   ALBUMIN 3.8 08/16/2023 0049   AST 18 08/16/2023 0049   ALT <5 08/16/2023 0049   ALKPHOS 75 08/16/2023 0049   BILITOT 1.6 (H) 08/16/2023 0049   GFRNONAA >60 08/15/2023 2325     Assessment/Plan:   External hemorrhoids  -  Hepatic steatosis  - CBC, CMP  Valiant Gaul, PA-C [facility] 11/26/2023, 8:04 AM  Patient Care Team: Arcadio Knuckles, MD as PCP - General (Internal Medicine) Donovan Gallant, NP (Inactive) as Nurse Practitioner (Nurse Practitioner)

## 2023-11-28 ENCOUNTER — Encounter: Payer: Self-pay | Admitting: Behavioral Health

## 2023-11-28 ENCOUNTER — Ambulatory Visit: Payer: PRIVATE HEALTH INSURANCE | Admitting: Behavioral Health

## 2023-11-28 ENCOUNTER — Other Ambulatory Visit (HOSPITAL_COMMUNITY): Payer: Self-pay

## 2023-11-28 ENCOUNTER — Other Ambulatory Visit: Payer: Self-pay

## 2023-11-28 DIAGNOSIS — F411 Generalized anxiety disorder: Secondary | ICD-10-CM | POA: Diagnosis not present

## 2023-11-28 DIAGNOSIS — F902 Attention-deficit hyperactivity disorder, combined type: Secondary | ICD-10-CM

## 2023-11-28 DIAGNOSIS — F331 Major depressive disorder, recurrent, moderate: Secondary | ICD-10-CM | POA: Diagnosis not present

## 2023-11-28 MED ORDER — AMPHETAMINE-DEXTROAMPHETAMINE 30 MG PO TABS
30.0000 mg | ORAL_TABLET | Freq: Two times a day (BID) | ORAL | 0 refills | Status: DC
  Filled 2024-01-12: qty 60, 30d supply, fill #0

## 2023-11-28 MED ORDER — SERTRALINE HCL 100 MG PO TABS
100.0000 mg | ORAL_TABLET | Freq: Every day | ORAL | 1 refills | Status: DC
  Filled 2023-11-28: qty 90, 90d supply, fill #0
  Filled 2023-12-18: qty 30, 30d supply, fill #0
  Filled 2024-01-12: qty 30, 30d supply, fill #1
  Filled 2024-02-20: qty 30, 30d supply, fill #2

## 2023-11-28 NOTE — Progress Notes (Signed)
 [facility]  Patient ID: CASSANDRA MCMANAMAN,  MRN: [id number]  PCP: Arcadio Knuckles, MD  Date of Evaluation: 11/28/2023 Time spent:30 minutes  Chief Complaint:  Chief Complaint   Depression; Anxiety; ADHD; Stress; Follow-up; Patient Education     HISTORY/CURRENT STATUS: HPI 38 year old male presents to this office for follow up and medication management. 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 Maintaining weight loss. Reports having increase in work related stressors. Is looking for another job.  Overall continues to be happy with current medication regimen. Anxiety 2/10, Depression is 2/10. Says he is sleeping 7-8 hours per night. Requesting no medication changes today. Denies mania, no psychosis, no SI/HI.   Previous psychiatric medications: Ritalin Vyvanse  Adderall  Zoloft    Individual Medical History/ Review of Systems: Changes? :No   Allergies: Nitrous oxide and Nsaids  Current Medications:  Current Outpatient Medications:    [START ON 12/11/2023] amphetamine -dextroamphetamine  (ADDERALL) 30 MG tablet, Take 1 tablet by mouth 2 (two) times daily., Disp: 60 tablet, Rfl: 0   CALCIUM PO, Take 1 tablet by mouth in the morning, at noon, and at bedtime., Disp: , Rfl:    cephALEXin  (KEFLEX ) 500 MG capsule, Take 1 capsule (500 mg total) by mouth 3 (three) times daily., Disp: 21 capsule, Rfl: 0   HYDROcodone -acetaminophen  (NORCO) 5-325 MG tablet, Take 1 tablet by mouth 3 (three) times daily as needed. To be taken after surgery (Patient not taking: Reported on 08/16/2023), Disp: 20 tablet, Rfl: 0   hydrocortisone  (ANUSOL -HC) 2.5 % rectal cream, Place rectally 2 (two) times daily as directed, Disp: 30 g, Rfl: 0   hydrocortisone  (ANUSOL -HC) 25 MG suppository, Place 1 suppository (25 mg total) rectally 2 (two) times daily., Disp: 12 suppository, Rfl: 0   Multiple Vitamin (MULTIVITAMIN) tablet, Take 1 tablet by mouth daily., Disp: , Rfl:    ondansetron  (ZOFRAN ) 4 MG tablet, Take  1 tablet (4 mg total) by mouth every 8 (eight) hours as needed for nausea or vomiting. (Patient taking differently: Take 4 mg by mouth daily as needed for nausea or vomiting.), Disp: 40 tablet, Rfl: 0   ondansetron  (ZOFRAN -ODT) 4 MG disintegrating tablet, Dissolve 1 tablet (4 mg total) by mouth every 6 (six) hours as needed for nausea/vomiting. (Patient taking differently: Take 4 mg by mouth daily as needed for nausea or vomiting.), Disp: 30 tablet, Rfl: 0   oxyCODONE  (OXY IR/ROXICODONE ) 5 MG immediate release tablet, Take 1 tablet (5 mg total) by mouth every 6 (six) hours as needed. (Patient taking differently: Take 5 mg by mouth daily as needed for moderate pain (pain score 4-6) or severe pain (pain score 7-10).), Disp: 30 tablet, Rfl: 0   sertraline  (ZOLOFT ) 100 MG tablet, Take 1 tablet (100 mg total) by mouth daily., Disp: 90 tablet, Rfl: 1   tamsulosin  (FLOMAX ) 0.4 MG CAPS capsule, Take 1 capsule (0.4 mg total) by mouth daily., Disp: 20 capsule, Rfl: 0   valsartan  (DIOVAN ) 320 MG tablet, Take 1 tablet (320 mg total) by mouth daily., Disp: 90 tablet, Rfl: 0 Medication Side Effects: none  Family Medical/ Social History: Changes? No  MENTAL HEALTH EXAM:  There were no vitals taken for this visit.There is no height or weight on file to calculate BMI.  General Appearance: Casual, Neat, and Well Groomed  Eye Contact:  Good  Speech:  Clear and Coherent  Volume:  Normal  Mood:  NA  Affect:  Appropriate  Thought Process:  Coherent  Orientation:  Full (Time, Place, and Person)  Thought Content: Logical   Suicidal Thoughts:  No  Homicidal Thoughts:  No  Memory:  WNL  Judgement:  Good  Insight:  Good  Psychomotor Activity:  Normal  Concentration:  Concentration: Good  Recall:  Good  Fund of Knowledge: Good  Language: Good  Assets:  Desire for Improvement  ADL's:  Intact  Cognition: WNL  Prognosis:  Good    DIAGNOSES:    ICD-10-CM   1. Attention deficit hyperactivity disorder (ADHD),  combined type  F90.2 amphetamine -dextroamphetamine  (ADDERALL) 30 MG tablet    2. Major depressive disorder, recurrent episode, moderate (HCC)  F33.1 sertraline  (ZOLOFT ) 100 MG tablet    3. Generalized anxiety disorder  F41.1 sertraline  (ZOLOFT ) 100 MG tablet      Receiving Psychotherapy: No    RECOMMENDATIONS:   Greater than 50% of 30  min face to face time with patient was spent on counseling and coordination of care. Discussed his continued stability with anxiety and depression but increased work related stressors.  ADHD symptoms are well controlled. Happy with current medication and requesting no med changes this visit.   To continue Adderall to 30 mg IR twice daily To continue  Zoloft  to 100 mg daily.  To follow up in 3 months to reassess Will monitor BP and HR regularly and log to provide to PCP Will report side effects or worsening symptoms and seek medical attention immediately if BP rises above 180.  Discussed potential benefits, risks, and side effects of stimulants with patient to include increased heart rate, palpitations, insomnia, increased anxiety, increased irritability, or decreased appetite.  Instructed patient to contact office if experiencing any significant tolerability issues.  Reviewed PDMP     Lincoln Renshaw, NP

## 2023-12-18 ENCOUNTER — Other Ambulatory Visit: Payer: Self-pay

## 2023-12-18 ENCOUNTER — Other Ambulatory Visit: Payer: Self-pay | Admitting: Internal Medicine

## 2023-12-18 ENCOUNTER — Other Ambulatory Visit (HOSPITAL_COMMUNITY): Payer: Self-pay

## 2023-12-18 DIAGNOSIS — I10 Essential (primary) hypertension: Secondary | ICD-10-CM

## 2023-12-27 ENCOUNTER — Other Ambulatory Visit (HOSPITAL_COMMUNITY): Payer: Self-pay

## 2024-01-12 ENCOUNTER — Other Ambulatory Visit: Payer: Self-pay | Admitting: Internal Medicine

## 2024-01-12 DIAGNOSIS — I10 Essential (primary) hypertension: Secondary | ICD-10-CM

## 2024-01-13 ENCOUNTER — Other Ambulatory Visit: Payer: Self-pay

## 2024-01-13 ENCOUNTER — Other Ambulatory Visit (HOSPITAL_COMMUNITY): Payer: Self-pay

## 2024-01-15 NOTE — Telephone Encounter (Signed)
 LVM to schedule pt for this refill..  JUST A FYI.SABRA

## 2024-01-15 NOTE — Telephone Encounter (Signed)
 Last OV 07/02/22  Pt has not been seen in > 1 year, needs OV.

## 2024-01-23 ENCOUNTER — Other Ambulatory Visit (HOSPITAL_COMMUNITY): Payer: Self-pay

## 2024-01-23 ENCOUNTER — Encounter (HOSPITAL_COMMUNITY): Payer: Self-pay

## 2024-01-23 IMAGING — MR MR HEAD W/O CM
12 series · 48 of 48 positions shown · non-contrast
Comparison: None.

CLINICAL DATA: Headache, hyperprolactinemia

EXAM:
MRI HEAD WITHOUT CONTRAST
TECHNIQUE: Multiplanar, multiecho pulse sequences of the brain and surrounding
structures were obtained without intravenous contrast.

[Series 9: T1 · sagittal · 4.0mm · 0.75mm/px · 1 of 31 slices shown (1 of 3)]
[im 1/31]
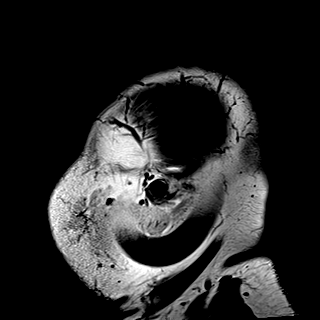

[Series 10: DWI · axial · 3.0mm · 0.94mm/px · z∈[-68,+71]mm · 10 of 168 slices shown (1 of 3)]
[im 1/168]
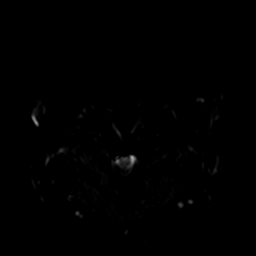
[im 19/168]
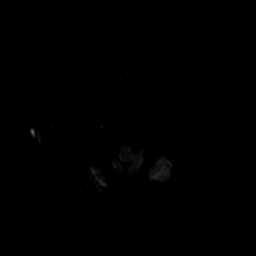
[im 38/168]
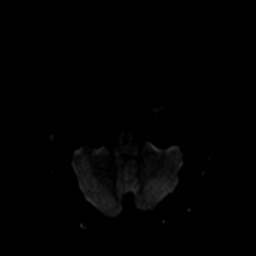
[im 56/168]
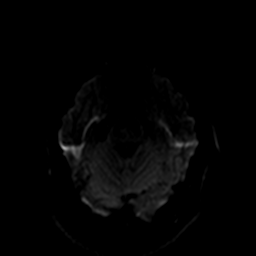
[im 75/168]
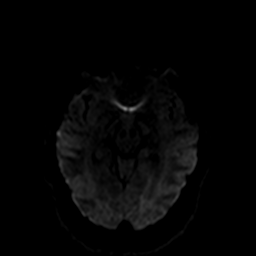
[im 93/168]
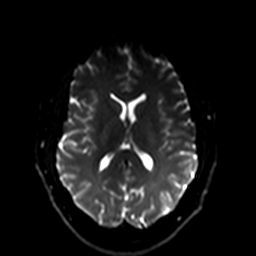
[im 112/168]
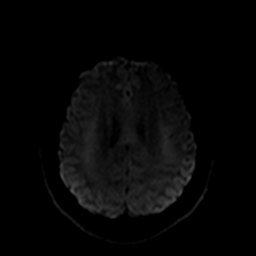
[im 130/168]
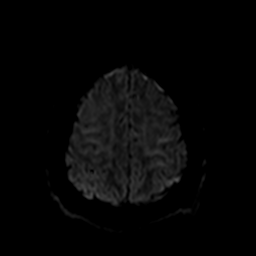
[im 149/168]
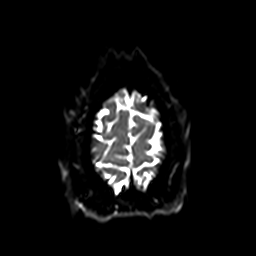
[im 168/168]
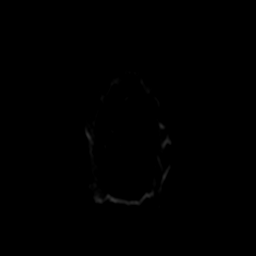

[Series 11: ax dwi_tracew · axial · 3.0mm · 0.94mm/px · z∈[-68,+71]mm · 5 of 84 slices shown]
[im 1/84]
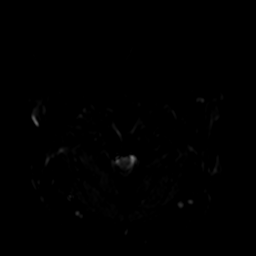
[im 21/84]
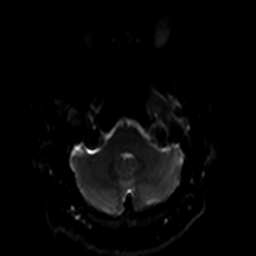
[im 42/84]
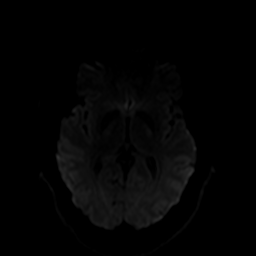
[im 63/84]
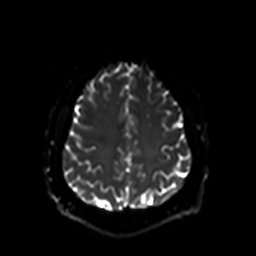
[im 84/84]
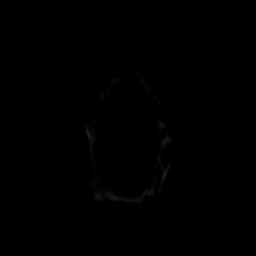

[Series 12: ax dwi_adc · axial · 3.0mm · 0.94mm/px · z∈[-68,+71]mm · 3 of 42 slices shown]
[im 1/42]
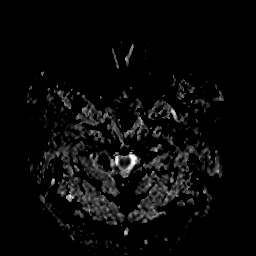
[im 21/42]
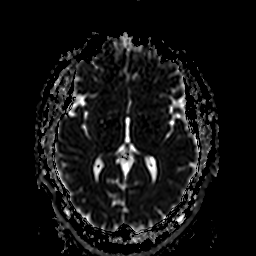
[im 42/42]
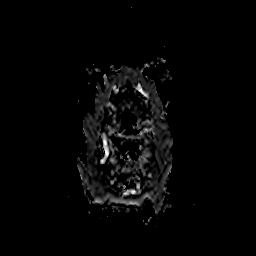

[Series 13: DWI · coronal · 5.0mm · 1.44mm/px · 4 of 66 slices shown (2 of 3)]
[im 1/66]
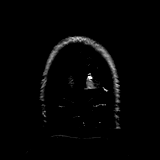
[im 22/66]
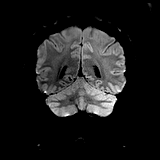
[im 44/66]
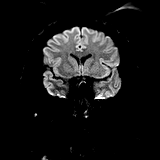
[im 66/66]
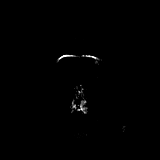

[Series 14: DWI · coronal · 5.0mm · 1.44mm/px · 2 of 33 slices shown (3 of 3)]
[im 1/33]
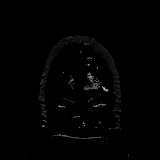
[im 33/33]
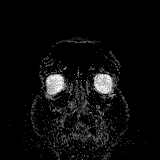

[Series 15: T2 · axial · 4.0mm · 0.36mm/px · z∈[-68,+74]mm · 2 of 30 slices shown (1 of 2)]
[im 1/30]
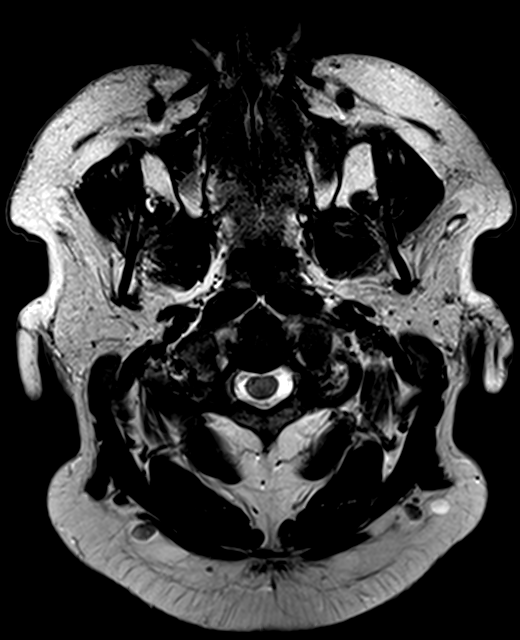
[im 30/30]
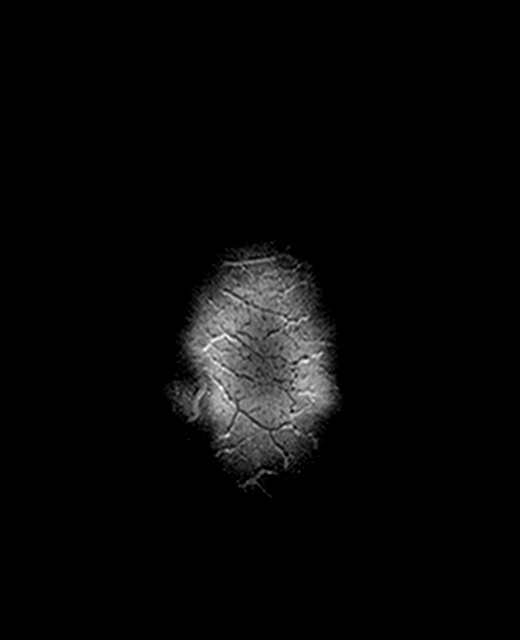

[Series 16: FLAIR · axial · 3.0mm · 0.72mm/px · z∈[-68,+74]mm · 2 of 26 slices shown]
[im 1/26]
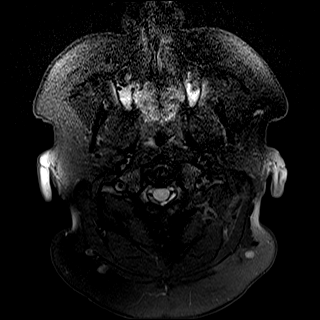
[im 26/26]
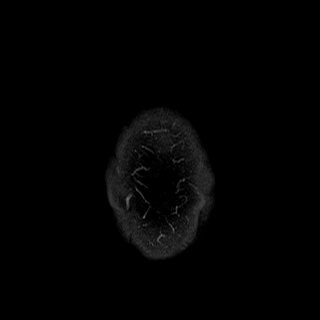

[Series 17: swi_images · axial · 1.5mm · 0.90mm/px · z∈[-64,+70]mm · 6 of 96 slices shown]
[im 1/96]
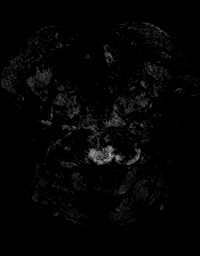
[im 20/96]
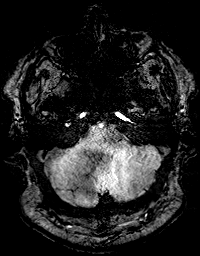
[im 39/96]
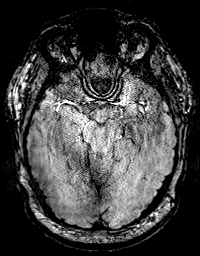
[im 58/96]
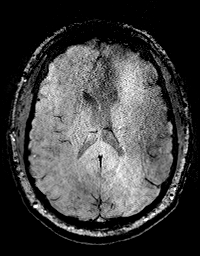
[im 77/96]
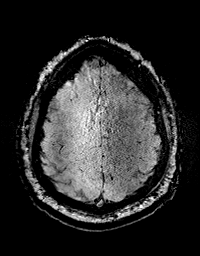
[im 96/96]
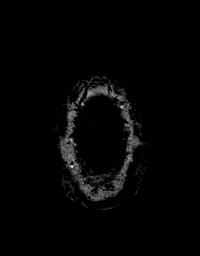

[Series 19: T1 · axial · 1.0mm · 0.94mm/px · z∈[-78,+75]mm · 10 of 160 slices shown (2 of 3)]
[im 1/160]
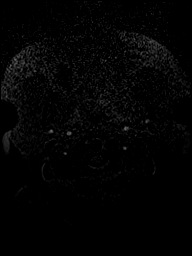
[im 18/160]
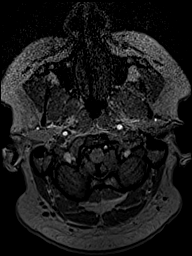
[im 36/160]
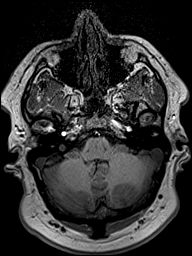
[im 54/160]
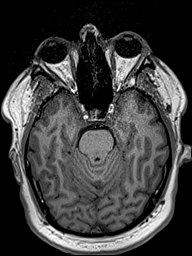
[im 71/160]
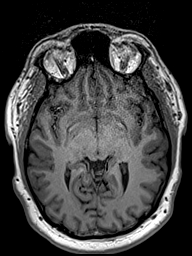
[im 89/160]
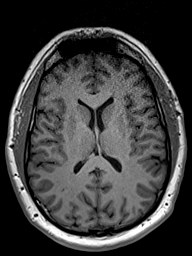
[im 107/160]
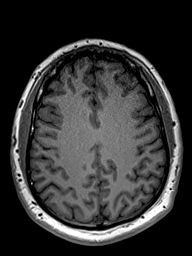
[im 124/160]
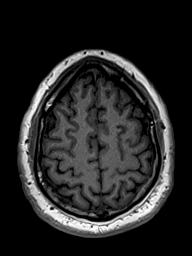
[im 142/160]
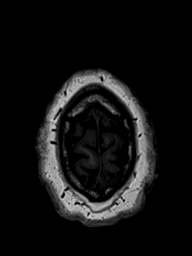
[im 160/160]
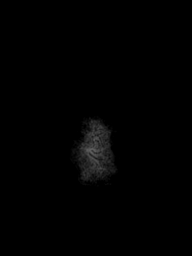

[Series 20: T2 · coronal · 4.0mm · 0.36mm/px · 2 of 37 slices shown (2 of 2)]
[im 1/37]
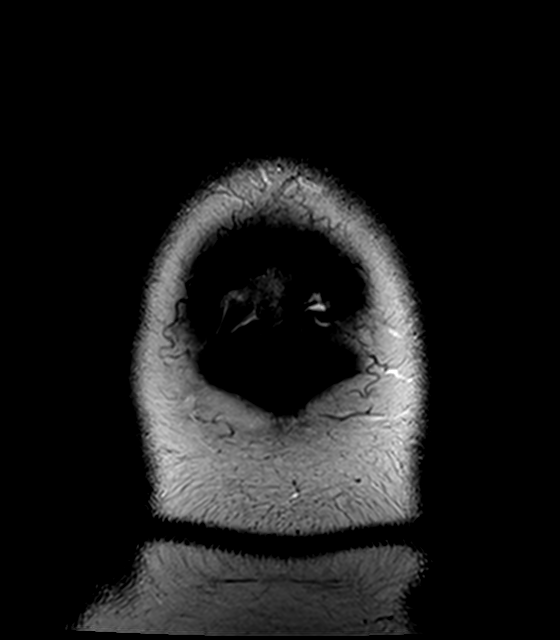
[im 37/37]
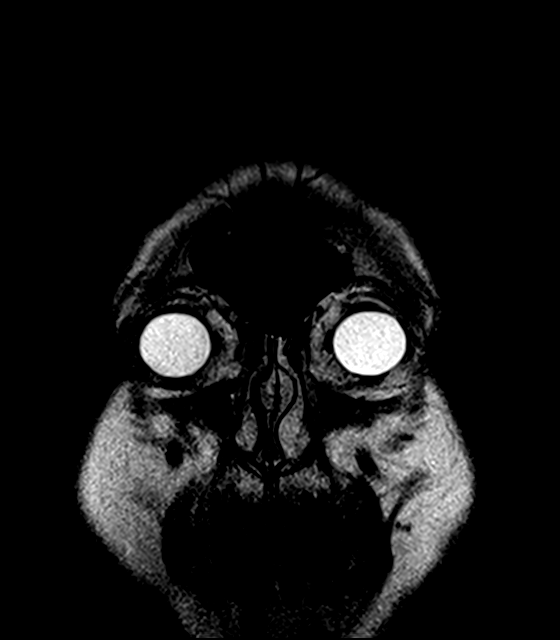

[Series 21: T1 · sagittal · 3.0mm · 0.42mm/px · 1 of 12 slices shown (3 of 3)]
[im 1/12]
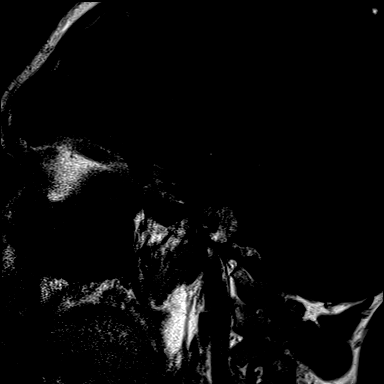

[48 of 48 positions shown; findings below may reference images not displayed]

FINDINGS: Brain: No restricted diffusion to suggest acute or subacute infarct.
No acute hemorrhage, mass, mass effect, or midline shift. No
hydrocephalus or extra-axial collection. Normal noncontrast
appearance of the pituitary gland. The infundibulum is midline.
Normal craniocervical junction.

Vascular: Normal flow voids.

Skull and upper cervical spine: Normal marrow signal.

Sinuses/Orbits: Minimal mucosal thickening in the ethmoid air cells.
Possible exophthalmos. The orbits are otherwise unremarkable.

Other: Trace fluid in right mastoid air cells.
IMPRESSION: No acute intracranial process. No etiology seen for the patient's
headache.

Normal noncontrast appearance of the pituitary gland.

## 2024-01-29 ENCOUNTER — Other Ambulatory Visit: Payer: Self-pay | Admitting: Medical Genetics

## 2024-02-05 ENCOUNTER — Ambulatory Visit (INDEPENDENT_AMBULATORY_CARE_PROVIDER_SITE_OTHER): Payer: PRIVATE HEALTH INSURANCE | Admitting: Internal Medicine

## 2024-02-05 ENCOUNTER — Other Ambulatory Visit (HOSPITAL_COMMUNITY): Payer: Self-pay

## 2024-02-05 ENCOUNTER — Ambulatory Visit: Payer: Self-pay | Admitting: Internal Medicine

## 2024-02-05 VITALS — BP 146/94 | HR 72 | Temp 98.7°F | Resp 16 | Ht 72.0 in | Wt 319.0 lb

## 2024-02-05 DIAGNOSIS — I10 Essential (primary) hypertension: Secondary | ICD-10-CM | POA: Diagnosis not present

## 2024-02-05 DIAGNOSIS — E230 Hypopituitarism: Secondary | ICD-10-CM | POA: Diagnosis not present

## 2024-02-05 DIAGNOSIS — G4733 Obstructive sleep apnea (adult) (pediatric): Secondary | ICD-10-CM

## 2024-02-05 DIAGNOSIS — E871 Hypo-osmolality and hyponatremia: Secondary | ICD-10-CM

## 2024-02-05 DIAGNOSIS — E781 Pure hyperglyceridemia: Secondary | ICD-10-CM | POA: Diagnosis not present

## 2024-02-05 DIAGNOSIS — Z0001 Encounter for general adult medical examination with abnormal findings: Secondary | ICD-10-CM

## 2024-02-05 DIAGNOSIS — E221 Hyperprolactinemia: Secondary | ICD-10-CM

## 2024-02-05 DIAGNOSIS — R7303 Prediabetes: Secondary | ICD-10-CM

## 2024-02-05 LAB — LIPID PANEL
Cholesterol: 236 mg/dL — ABNORMAL HIGH (ref 0–200)
HDL: 37.7 mg/dL — ABNORMAL LOW (ref >39.00)
LDL Cholesterol: 159 mg/dL — ABNORMAL HIGH (ref 0–99)
NonHDL: 198.46
Total CHOL/HDL Ratio: 6
Triglycerides: 196 mg/dL — ABNORMAL HIGH (ref 0.0–149.0)
VLDL: 39.2 mg/dL (ref 0.0–40.0)

## 2024-02-05 LAB — BASIC METABOLIC PANEL WITH GFR
BUN: 11 mg/dL (ref 6–23)
CO2: 27 meq/L (ref 19–32)
Calcium: 10.7 mg/dL — ABNORMAL HIGH (ref 8.4–10.5)
Chloride: 99 meq/L (ref 96–112)
Creatinine, Ser: 0.98 mg/dL (ref 0.40–1.50)
GFR: 98.29 mL/min (ref >60.00)
Glucose, Bld: 101 mg/dL — ABNORMAL HIGH (ref 70–99)
Potassium: 4.2 meq/L (ref 3.5–5.1)
Sodium: 139 meq/L (ref 135–145)

## 2024-02-05 LAB — URINALYSIS, ROUTINE W REFLEX MICROSCOPIC
Bilirubin Urine: NEGATIVE
Hgb urine dipstick: NEGATIVE
Ketones, ur: NEGATIVE
Leukocytes,Ua: NEGATIVE
Nitrite: NEGATIVE
RBC / HPF: NONE SEEN (ref 0–?)
Specific Gravity, Urine: 1.015 (ref 1.000–1.030)
Total Protein, Urine: NEGATIVE
Urine Glucose: NEGATIVE
Urobilinogen, UA: 0.2 (ref 0.0–1.0)
WBC, UA: NONE SEEN (ref 0–?)
pH: 6 (ref 5.0–8.0)

## 2024-02-05 LAB — CBC WITH DIFFERENTIAL/PLATELET
Basophils Absolute: 0.1 K/uL (ref 0.0–0.1)
Basophils Relative: 0.7 % (ref 0.0–3.0)
Eosinophils Absolute: 0.2 K/uL (ref 0.0–0.7)
Eosinophils Relative: 2.2 % (ref 0.0–5.0)
HCT: 46.4 % (ref 39.0–52.0)
Hemoglobin: 15.8 g/dL (ref 13.0–17.0)
Lymphocytes Relative: 39.2 % (ref 12.0–46.0)
Lymphs Abs: 2.8 K/uL (ref 0.7–4.0)
MCHC: 34 g/dL (ref 30.0–36.0)
MCV: 85.7 fl (ref 78.0–100.0)
Monocytes Absolute: 0.4 K/uL (ref 0.1–1.0)
Monocytes Relative: 5.5 % (ref 3.0–12.0)
Neutro Abs: 3.7 K/uL (ref 1.4–7.7)
Neutrophils Relative %: 52.4 % (ref 43.0–77.0)
Platelets: 283 K/uL (ref 150.0–400.0)
RBC: 5.41 Mil/uL (ref 4.22–5.81)
RDW: 13.6 % (ref 11.5–15.5)
WBC: 7.1 K/uL (ref 4.0–10.5)

## 2024-02-05 LAB — TSH: TSH: 0.97 u[IU]/mL (ref 0.35–5.50)

## 2024-02-05 LAB — HEMOGLOBIN A1C: Hgb A1c MFr Bld: 5.7 % (ref 4.6–6.5)

## 2024-02-05 LAB — PSA: PSA: 0.92 ng/mL (ref 0.10–4.00)

## 2024-02-05 MED ORDER — ZEPBOUND 2.5 MG/0.5ML ~~LOC~~ SOAJ
2.5000 mg | SUBCUTANEOUS | 0 refills | Status: DC
  Filled 2024-02-05: qty 4, 56d supply, fill #0
  Filled 2024-02-07 (×2): qty 2, 28d supply, fill #0

## 2024-02-05 MED ORDER — TORSEMIDE 20 MG PO TABS
20.0000 mg | ORAL_TABLET | Freq: Every day | ORAL | 0 refills | Status: DC
  Filled 2024-02-05: qty 31, 31d supply, fill #0
  Filled 2024-03-04: qty 31, 31d supply, fill #1
  Filled 2024-05-12: qty 28, 28d supply, fill #2

## 2024-02-05 MED ORDER — VALSARTAN 320 MG PO TABS
320.0000 mg | ORAL_TABLET | Freq: Every day | ORAL | 0 refills | Status: DC
  Filled 2024-02-05: qty 31, 31d supply, fill #0
  Filled 2024-03-04: qty 31, 31d supply, fill #1
  Filled 2024-05-12 – 2024-05-28 (×2): qty 28, 28d supply, fill #2

## 2024-02-05 NOTE — Progress Notes (Signed)
 Subjective:  Patient ID: Frederick Snyder, male    DOB: 03/03/1986  Age: 38 y.o. MRN: [id number]  CC: Hypertension, Hyperlipidemia, and Annual Exam   HPI Frederick Snyder presents for a CPX and f/up  ---  Discussed the use of AI scribe software for clinical note transcription with the patient, who gave verbal consent to proceed.  History of Present Illness Frederick Snyder is a 38 year old male with hypertension who presents with concerns about blood pressure fluctuations.  He has been experiencing fluctuations in his blood pressure, noting it was elevated when checked at home last night but returned to normal after resting. He suspects his blood pressure may be elevated throughout the day. He experiences headaches and blurred vision. No chest pain or shortness of breath. He is currently taking valsartan  for blood pressure control.  He is taking Adderall for ADHD without side effects. Due to his gastric bypass, he sometimes requires an additional dose in the afternoon.  He had a significant hemorrhoid issue in the past, treated with topical cream and suppositories, which reduced the size. There was some bleeding noted during wiping, but it was not excessive. He stopped using Anusol  after the hemorrhoid reduced in size.  He passed a kidney stone earlier this year and experienced severe pain again a week or two later, which resolved after rest. He is not currently experiencing any kidney stone symptoms.  He reports symptoms of low testosterone , including low libido and fatigue. No erectile dysfunction. He has had an elevated prolactin level in the past.     Outpatient Medications Prior to Visit  Medication Sig Dispense Refill   amphetamine -dextroamphetamine  (ADDERALL) 30 MG tablet Take 1 tablet by mouth 2 (two) times daily. 60 tablet 0   Multiple Vitamin (MULTIVITAMIN) tablet Take 1 tablet by mouth daily.     sertraline  (ZOLOFT ) 100 MG tablet Take 1 tablet (100 mg total)  by mouth daily. 90 tablet 1   CALCIUM PO Take 1 tablet by mouth in the morning, at noon, and at bedtime.     HYDROcodone -acetaminophen  (NORCO) 5-325 MG tablet Take 1 tablet by mouth 3 (three) times daily as needed. To be taken after surgery (Patient not taking: Reported on 08/16/2023) 20 tablet 0   hydrocortisone  (ANUSOL -HC) 2.5 % rectal cream Place rectally 2 (two) times daily as directed 30 g 0   hydrocortisone  (ANUSOL -HC) 25 MG suppository Place 1 suppository (25 mg total) rectally 2 (two) times daily. 12 suppository 0   oxyCODONE  (OXY IR/ROXICODONE ) 5 MG immediate release tablet Take 1 tablet (5 mg total) by mouth every 6 (six) hours as needed. (Patient taking differently: Take 5 mg by mouth daily as needed for moderate pain (pain score 4-6) or severe pain (pain score 7-10).) 30 tablet 0   tamsulosin  (FLOMAX ) 0.4 MG CAPS capsule Take 1 capsule (0.4 mg total) by mouth daily. 20 capsule 0   valsartan  (DIOVAN ) 320 MG tablet Take 1 tablet (320 mg total) by mouth daily. 90 tablet 0   No facility-administered medications prior to visit.    ROS Review of Systems  Constitutional:  Positive for unexpected weight change (wt gain). Negative for appetite change, chills, diaphoresis, fatigue and fever.  HENT: Negative.  Negative for trouble swallowing.   Eyes:  Positive for visual disturbance.  Respiratory:  Positive for apnea. Negative for cough, choking, shortness of breath and wheezing.   Cardiovascular:  Negative for chest pain, palpitations and leg swelling.  Gastrointestinal: Negative.  Negative for abdominal pain,  constipation, diarrhea, nausea and vomiting.  Endocrine: Negative.   Genitourinary: Negative.  Negative for difficulty urinating.  Musculoskeletal: Negative.  Negative for arthralgias and myalgias.  Skin: Negative.   Neurological:  Positive for headaches. Negative for dizziness, weakness and light-headedness.  Hematological:  Negative for adenopathy. Does not bruise/bleed easily.   Psychiatric/Behavioral: Negative.  Negative for decreased concentration.     Objective:  BP (!) 146/94 (BP Location: Left Arm, Patient Position: Sitting, Cuff Size: Normal) Comment: BP (R) 148/96  Pulse 72   Temp 98.7 F (37.1 C) (Oral)   Resp 16   Ht 6' (1.829 m)   Wt (!) 319 lb (144.7 kg)   SpO2 96%   BMI 43.26 kg/m   BP Readings from Last 3 Encounters:  02/05/24 (!) 146/94  10/21/23 123/85  08/16/23 118/79    Wt Readings from Last 3 Encounters:  02/05/24 (!) 319 lb (144.7 kg)  08/15/23 300 lb (136.1 kg)  08/07/23 (!) 303 lb 9.2 oz (137.7 kg)    Physical Exam Vitals reviewed.  Constitutional:      General: He is not in acute distress.    Appearance: He is obese. He is not toxic-appearing or diaphoretic.  HENT:     Nose: Nose normal.     Mouth/Throat:     Mouth: Mucous membranes are moist.  Eyes:     General: No scleral icterus.    Conjunctiva/sclera: Conjunctivae normal.  Cardiovascular:     Rate and Rhythm: Regular rhythm. Tachycardia present.     Heart sounds: No murmur heard.    No friction rub. No gallop.  Pulmonary:     Effort: Pulmonary effort is normal.     Breath sounds: No stridor. No wheezing, rhonchi or rales.  Abdominal:     General: Abdomen is protuberant. Bowel sounds are normal. There is no distension.     Palpations: There is splenomegaly. There is no hepatomegaly or mass.     Tenderness: There is no abdominal tenderness. There is no guarding.     Hernia: No hernia is present.  Musculoskeletal:        General: Normal range of motion.     Cervical back: Neck supple.     Right lower leg: No edema.     Left lower leg: No edema.  Lymphadenopathy:     Cervical: No cervical adenopathy.  Skin:    General: Skin is warm and dry.  Neurological:     Mental Status: He is alert.  Psychiatric:        Mood and Affect: Mood normal.        Behavior: Behavior normal.     Lab Results  Component Value Date   WBC 7.1 02/05/2024   HGB 15.8  02/05/2024   HCT 46.4 02/05/2024   PLT 283.0 02/05/2024   GLUCOSE 101 (H) 02/05/2024   CHOL 236 (H) 02/05/2024   TRIG 196.0 (H) 02/05/2024   HDL 37.70 (L) 02/05/2024   LDLDIRECT 114.0 09/07/2020   LDLCALC 159 (H) 02/05/2024   ALT <5 08/16/2023   AST 18 08/16/2023   NA 139 02/05/2024   K 4.2 02/05/2024   CL 99 02/05/2024   CREATININE 0.98 02/05/2024   BUN 11 02/05/2024   CO2 27 02/05/2024   TSH 0.97 02/05/2024   PSA 0.92 02/05/2024   INR 1.1 08/16/2023   HGBA1C 5.7 02/05/2024    No results found.  Assessment & Plan:   Primary hypertension- He has not achieved his BP goal and is symptomatic. Ca++  is mildly elevated. Will add a loop diuretic to the ARB. -     Basic metabolic panel with GFR; Future -     CBC with Differential/Platelet; Future -     Urinalysis, Routine w reflex microscopic; Future -     TSH; Future -     Torsemide ; Take 1 tablet (20 mg total) by mouth daily.  Dispense: 90 tablet; Refill: 0 -     Valsartan ; Take 1 tablet (320 mg total) by mouth daily.  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 -     Testosterone  Total,Free,Bio, Males; Future  Encounter for general adult medical examination with abnormal findings- Exam completed, labs reviewed, vaccines reviewed, no cancer screenings indicated, pt ed material was given.  -     PSA; Future  Hyperprolactinemia (HCC) -     Prolactin; Future  Hyponatremia -     Sodium, urine, random; Future  Hypercalcemia -     VITAMIN D  25 Hydroxy (Vit-D Deficiency, Fractures); Future -     PTH, intact and calcium; Future  OSA on CPAP -     Zepbound ; Inject 2.5 mg into the skin once a week.  Dispense: 4 mL; Refill: 0     Follow-up: Return in about 6 months (around 08/07/2024).  Debby Molt, MD

## 2024-02-05 NOTE — Patient Instructions (Signed)
 Health Maintenance, Male  Adopting a healthy lifestyle and getting preventive care are important in promoting health and wellness. Ask your health care provider about:  The right schedule for you to have regular tests and exams.  Things you can do on your own to prevent diseases and keep yourself healthy.  What should I know about diet, weight, and exercise?  Eat a healthy diet    Eat a diet that includes plenty of vegetables, fruits, low-fat dairy products, and lean protein.  Do not eat a lot of foods that are high in solid fats, added sugars, or sodium.  Maintain a healthy weight  Body mass index (BMI) is a measurement that can be used to identify possible weight problems. It estimates body fat based on height and weight. Your health care provider can help determine your BMI and help you achieve or maintain a healthy weight.  Get regular exercise  Get regular exercise. This is one of the most important things you can do for your health. Most adults should:  Exercise for at least 150 minutes each week. The exercise should increase your heart rate and make you sweat (moderate-intensity exercise).  Do strengthening exercises at least twice a week. This is in addition to the moderate-intensity exercise.  Spend less time sitting. Even light physical activity can be beneficial.  Watch cholesterol and blood lipids  Have your blood tested for lipids and cholesterol at 38 years of age, then have this test every 5 years.  You may need to have your cholesterol levels checked more often if:  Your lipid or cholesterol levels are high.  You are older than 38 years of age.  You are at high risk for heart disease.  What should I know about cancer screening?  Many types of cancers can be detected early and may often be prevented. Depending on your health history and family history, you may need to have cancer screening at various ages. This may include screening for:  Colorectal cancer.  Prostate cancer.  Skin cancer.  Lung  cancer.  What should I know about heart disease, diabetes, and high blood pressure?  Blood pressure and heart disease  High blood pressure causes heart disease and increases the risk of stroke. This is more likely to develop in people who have high blood pressure readings or are overweight.  Talk with your health care provider about your target blood pressure readings.  Have your blood pressure checked:  Every 3-5 years if you are 24-52 years of age.  Every year if you are 3 years old or older.  If you are between the ages of 60 and 72 and are a current or former smoker, ask your health care provider if you should have a one-time screening for abdominal aortic aneurysm (AAA).  Diabetes  Have regular diabetes screenings. This checks your fasting blood sugar level. Have the screening done:  Once every three years after age 66 if you are at a normal weight and have a low risk for diabetes.  More often and at a younger age if you are overweight or have a high risk for diabetes.  What should I know about preventing infection?  Hepatitis B  If you have a higher risk for hepatitis B, you should be screened for this virus. Talk with your health care provider to find out if you are at risk for hepatitis B infection.  Hepatitis C  Blood testing is recommended for:  Everyone born from [birth year] through 1965.  Anyone  with known risk factors for hepatitis C.  Sexually transmitted infections (STIs)  You should be screened each year for STIs, including gonorrhea and chlamydia, if:  You are sexually active and are younger than 38 years of age.  You are older than 38 years of age and your health care provider tells you that you are at risk for this type of infection.  Your sexual activity has changed since you were last screened, and you are at increased risk for chlamydia or gonorrhea. Ask your health care provider if you are at risk.  Ask your health care provider about whether you are at high risk for HIV. Your health care provider  may recommend a prescription medicine to help prevent HIV infection. If you choose to take medicine to prevent HIV, you should first get tested for HIV. You should then be tested every 3 months for as long as you are taking the medicine.  Follow these instructions at home:  Alcohol use  Do not drink alcohol if your health care provider tells you not to drink.  If you drink alcohol:  Limit how much you have to 0-2 drinks a day.  Know how much alcohol is in your drink. In the [place] one drink equals one 12 oz bottle of beer (355 mL), one 5 oz glass of wine (148 mL), or one 1 oz glass of hard liquor (44 mL).  Lifestyle  Do not use any products that contain nicotine or tobacco. These products include cigarettes, chewing tobacco, and vaping devices, such as e-cigarettes. If you need help quitting, ask your health care provider.  Do not use street drugs.  Do not share needles.  Ask your health care provider for help if you need support or information about quitting drugs.  General instructions  Schedule regular health, dental, and eye exams.  Stay current with your vaccines.  Tell your health care provider if:  You often feel depressed.  You have ever been abused or do not feel safe at home.  Summary  Adopting a healthy lifestyle and getting preventive care are important in promoting health and wellness.  Follow your health care provider's instructions about healthy diet, exercising, and getting tested or screened for diseases.  Follow your health care provider's instructions on monitoring your cholesterol and blood pressure.  This information is not intended to replace advice given to you by your health care provider. Make sure you discuss any questions you have with your health care provider.  Document Revised: 10/17/2020 Document Reviewed: 10/17/2020  Elsevier Patient Education  2024 [employer].

## 2024-02-06 ENCOUNTER — Other Ambulatory Visit (HOSPITAL_COMMUNITY): Payer: Self-pay

## 2024-02-06 ENCOUNTER — Other Ambulatory Visit (INDEPENDENT_AMBULATORY_CARE_PROVIDER_SITE_OTHER): Payer: PRIVATE HEALTH INSURANCE

## 2024-02-06 LAB — PROLACTIN: Prolactin: 5 ng/mL (ref 2.0–18.0)

## 2024-02-06 LAB — TESTOSTERONE TOTAL,FREE,BIO, MALES
Albumin: 4.9 g/dL (ref 3.6–5.1)
Sex Hormone Binding: 19 nmol/L (ref 10–50)
Testosterone: 200 ng/dL — ABNORMAL LOW (ref 250–827)

## 2024-02-06 LAB — VITAMIN D 25 HYDROXY (VIT D DEFICIENCY, FRACTURES): VITD: 32.68 ng/mL (ref 30.00–100.00)

## 2024-02-06 LAB — SODIUM, URINE, RANDOM: Sodium, Ur: 76 mmol/L (ref 28–272)

## 2024-02-06 MED ORDER — XYOSTED 75 MG/0.5ML ~~LOC~~ SOAJ: 75.0000 mg | SUBCUTANEOUS | 1 refills | Status: DC

## 2024-02-07 ENCOUNTER — Encounter: Payer: Self-pay | Admitting: Internal Medicine

## 2024-02-07 ENCOUNTER — Encounter (HOSPITAL_COMMUNITY): Payer: Self-pay

## 2024-02-07 ENCOUNTER — Other Ambulatory Visit: Payer: Self-pay

## 2024-02-07 ENCOUNTER — Other Ambulatory Visit (HOSPITAL_COMMUNITY): Payer: Self-pay

## 2024-02-07 NOTE — Telephone Encounter (Signed)
**Note De-identified  [place] Obfuscation** Please advise 

## 2024-02-11 ENCOUNTER — Telehealth: Payer: Self-pay

## 2024-02-11 ENCOUNTER — Other Ambulatory Visit (HOSPITAL_COMMUNITY): Payer: Self-pay

## 2024-02-11 NOTE — Telephone Encounter (Signed)
 Pharmacy Patient Advocate Encounter   Received notification from Patient Advice Request messages that prior authorization for Zepbound  2.5mg /0.55ml is required/requested.   Insurance verification completed.   The patient is insured through [employer] .   Per test claim: Zepbound  is not covered. Plan/Service not covered. Plan/Benefit exclusion. Weigh loss drugs are excluded from coverage.

## 2024-02-12 LAB — TIQ-NTM

## 2024-02-12 LAB — PTH, INTACT AND CALCIUM
Calcium: 10.5 mg/dL — ABNORMAL HIGH (ref 8.6–10.3)
PTH: 29 pg/mL (ref 16–77)

## 2024-02-13 NOTE — Telephone Encounter (Signed)
 Patient has been made aware Via Voice Message.

## 2024-02-14 NOTE — Telephone Encounter (Signed)
 This has been handled via telephone call. Please see other encounter for information.

## 2024-02-17 ENCOUNTER — Telehealth: Payer: Self-pay

## 2024-02-17 NOTE — Telephone Encounter (Signed)
 Copied from CRM [id number]. Topic: Clinical - Medication Prior Auth >> Feb 17, 2024  3:29 PM [place] M wrote: Reason for CRM: Patient was told from pharmacy that he needs a prior authorization for XYOSTED 

## 2024-02-18 ENCOUNTER — Telehealth: Payer: Self-pay | Admitting: Radiology

## 2024-02-18 NOTE — Telephone Encounter (Signed)
 Copied from CRM [id number]. Topic: Clinical - Medication Prior Auth >> Feb 17, 2024  4:55 PM Sophia H wrote: Reason for CRM: Pharmacy is calling to let clinic know that PA is needed for XYOSTED  75 MG/0.5ML SOAJ. States if clinic uses Cover my meds for PA they can start it on their end and send it to the clinic for completion. They will be using this process going forward if so. If any questions can call [facility] - # [phone]

## 2024-02-21 ENCOUNTER — Other Ambulatory Visit (HOSPITAL_COMMUNITY): Payer: Self-pay

## 2024-02-21 ENCOUNTER — Telehealth: Payer: Self-pay

## 2024-02-21 ENCOUNTER — Other Ambulatory Visit: Payer: Self-pay

## 2024-02-21 ENCOUNTER — Other Ambulatory Visit: Payer: Self-pay | Admitting: Internal Medicine

## 2024-02-21 DIAGNOSIS — E230 Hypopituitarism: Secondary | ICD-10-CM

## 2024-02-21 MED ORDER — NEEDLE (DISP) 21G X 1-1/2" MISC
1.0000 | 0 refills | Status: DC
  Filled 2024-02-21: qty 50, fill #0

## 2024-02-21 MED ORDER — NEEDLE (DISP) 21G X 1-1/2" MISC: 1.0000 | 0 refills | Status: AC

## 2024-02-21 MED ORDER — SYRINGE 2-3 ML 3 ML MISC
1.0000 | 0 refills | Status: DC
  Filled 2024-02-21: qty 100, fill #0

## 2024-02-21 MED ORDER — NEEDLE (DISP) 18G X 1-1/2" MISC: 1.0000 | 0 refills | Status: AC

## 2024-02-21 MED ORDER — SYRINGE 2-3 ML 3 ML MISC: 1.0000 | 0 refills | Status: AC

## 2024-02-21 MED ORDER — NEEDLE (DISP) 18G X 1-1/2" MISC
1.0000 | 0 refills | Status: DC
  Filled 2024-02-21: qty 50, fill #0

## 2024-02-21 MED ORDER — TESTOSTERONE CYPIONATE 200 MG/ML IM SOLN
200.0000 mg | INTRAMUSCULAR | 0 refills | Status: AC
  Filled 2024-02-21: qty 2, 28d supply, fill #0
  Filled 2024-05-12: qty 2, 28d supply, fill #1
  Filled 2024-05-28: qty 2, 28d supply, fill #2

## 2024-02-21 MED ORDER — TESTOSTERONE 20.25 MG/1.25GM (1.62%) TD GEL
2.5000 g | Freq: Every day | TRANSDERMAL | 1 refills | Status: DC
  Filled 2024-02-21: qty 75, 30d supply, fill #0

## 2024-02-21 NOTE — Telephone Encounter (Signed)
 This is a duplicate message. Another message has already been sent to the PA team. I will close this encounter.

## 2024-02-21 NOTE — Telephone Encounter (Signed)
**Note De-identified  [place] Obfuscation** Please advise 

## 2024-02-21 NOTE — Telephone Encounter (Signed)
 Good Morning wonderful people,  Can somebody please help me with this request?

## 2024-02-21 NOTE — Telephone Encounter (Signed)
 Pharmacy Patient Advocate Encounter   Received notification from Pt Calls Messages that prior authorization for Xyosted  75mg /0.8ml is required/requested.   Insurance verification completed.   The patient is insured through [employer] .   Per test claim:  Testosterone  Gel and Testosterone  cypionate - is preferred by the insurance.  If suggested medication is appropriate, Please send in a new RX and discontinue this one. If not, please advise as to why it's not appropriate so that we may request a Prior Authorization. Please note, some preferred medications may still require a PA.  If the suggested medications have not been trialed and there are no contraindications to their use, the PA will not be submitted, as it will not be approved.   Xyosted  is not covered by the plan.

## 2024-02-21 NOTE — Telephone Encounter (Signed)
 Thank you. Will forward to TJ.

## 2024-02-21 NOTE — Telephone Encounter (Signed)
 Patient advised me that he can't use the gel it causes him to break out. Can he use the injection?

## 2024-02-25 ENCOUNTER — Other Ambulatory Visit: Payer: Self-pay

## 2024-02-27 ENCOUNTER — Other Ambulatory Visit (HOSPITAL_COMMUNITY): Payer: Self-pay

## 2024-02-28 ENCOUNTER — Other Ambulatory Visit (HOSPITAL_COMMUNITY): Payer: Self-pay

## 2024-02-28 ENCOUNTER — Other Ambulatory Visit: Payer: Self-pay

## 2024-02-28 ENCOUNTER — Encounter: Payer: Self-pay | Admitting: Behavioral Health

## 2024-02-28 ENCOUNTER — Ambulatory Visit (INDEPENDENT_AMBULATORY_CARE_PROVIDER_SITE_OTHER): Payer: PRIVATE HEALTH INSURANCE | Admitting: Behavioral Health

## 2024-02-28 DIAGNOSIS — F411 Generalized anxiety disorder: Secondary | ICD-10-CM | POA: Diagnosis not present

## 2024-02-28 DIAGNOSIS — F902 Attention-deficit hyperactivity disorder, combined type: Secondary | ICD-10-CM

## 2024-02-28 DIAGNOSIS — F331 Major depressive disorder, recurrent, moderate: Secondary | ICD-10-CM | POA: Diagnosis not present

## 2024-02-28 MED ORDER — SERTRALINE HCL 100 MG PO TABS
100.0000 mg | ORAL_TABLET | Freq: Every day | ORAL | 1 refills | Status: DC
  Filled 2024-02-28 – 2024-03-17 (×2): qty 90, 90d supply, fill #0
  Filled 2024-05-12 – 2024-06-19 (×3): qty 90, 90d supply, fill #1

## 2024-02-28 MED ORDER — AMPHETAMINE-DEXTROAMPHETAMINE 30 MG PO TABS
30.0000 mg | ORAL_TABLET | Freq: Two times a day (BID) | ORAL | 0 refills | Status: DC
  Filled 2024-02-28 – 2024-05-12 (×2): qty 60, 30d supply, fill #0

## 2024-02-28 NOTE — Progress Notes (Signed)
 [facility]  Patient ID: Frederick Snyder,  MRN: [id number]  PCP: Joshua Debby CROME, MD  Date of Evaluation: 02/28/2024 Time spent:20 minutes  Chief Complaint:   HISTORY/CURRENT STATUS: HPI 38 year old male presents to this office for follow up and medication management. 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 Maintaining weight loss. Starting a new job with increased pay and better benefits. Reports having increase in work related stressors. Is looking for another job.  Overall continues to be happy with current medication regimen. Anxiety 2/10, Depression is 2/10. Says he is sleeping 7-8 hours per night. Requesting no medication changes today. Denies mania, no psychosis, no SI/HI.   Previous psychiatric medications: Ritalin Vyvanse  Adderall  Zoloft  Individual Medical History/ Review of Systems: Changes? :No   Allergies: Nitrous oxide and Nsaids  Current Medications:  Current Outpatient Medications:  .  amphetamine -dextroamphetamine  (ADDERALL) 30 MG tablet, Take 1 tablet by mouth 2 (two) times daily., Disp: 60 tablet, Rfl: 0 .  Multiple Vitamin (MULTIVITAMIN) tablet, Take 1 tablet by mouth daily., Disp: , Rfl:  .  NEEDLE, DISP, 18 G 18G X 1-1/2 MISC, 1 Act by Does not apply route every 14 (fourteen) days., Disp: 50 each, Rfl: 0 .  NEEDLE, DISP, 21 G 21G X 1-1/2 MISC, 1 Act by Does not apply route every 14 (fourteen) days., Disp: 50 each, Rfl: 0 .  sertraline  (ZOLOFT ) 100 MG tablet, Take 1 tablet (100 mg total) by mouth daily., Disp: 90 tablet, Rfl: 1 .  Syringe, Disposable, (2-3CC SYRINGE) 3 ML MISC, 1 Act by Does not apply route every 14 (fourteen) days., Disp: 100 each, Rfl: 0 .  testosterone  cypionate (DEPOTESTOSTERONE CYPIONATE) 200 MG/ML injection, Inject 1 mL (200 mg total) into the muscle every 14 (fourteen) days., Disp: 10 mL, Rfl: 0 .  torsemide  (DEMADEX ) 20 MG tablet, Take 1 tablet (20 mg total) by mouth daily., Disp: 90 tablet, Rfl: 0 .  valsartan   (DIOVAN ) 320 MG tablet, Take 1 tablet (320 mg total) by mouth daily., Disp: 90 tablet, Rfl: 0 .  ZEPBOUND  2.5 MG/0.5ML Pen, Inject 2.5 mg into the skin once a week., Disp: 2 mL, Rfl: 0 Medication Side Effects: none  Family Medical/ Social History: Changes? No  MENTAL HEALTH EXAM:  There were no vitals taken for this visit.There is no height or weight on file to calculate BMI.  General Appearance: Casual, Neat, and Well Groomed  Eye Contact:  Good  Speech:  Clear and Coherent  Volume:  Normal  Mood:  NA  Affect:  Appropriate  Thought Process:  Coherent  Orientation:  Full (Time, Place, and Person)  Thought Content: Logical   Suicidal Thoughts:  No  Homicidal Thoughts:  No  Memory:  WNL  Judgement:  Good  Insight:  Good  Psychomotor Activity:  Normal  Concentration:  Concentration: Good  Recall:  Good  Fund of Knowledge: Good  Language: Good  Assets:  Desire for Improvement  ADL's:  Intact  Cognition: WNL  Prognosis:  Good    DIAGNOSES: No diagnosis found.  Receiving Psychotherapy: No    RECOMMENDATIONS:  Greater than 50% of 30  min face to face time with patient was spent on counseling and coordination of care. Discussed his new job. He is excited with the positive impact on life. Managing stressors much better.   ADHD symptoms are well controlled. Happy with current medication and requesting no med changes this visit.   To continue Adderall to 30 mg IR twice daily To continue  Zoloft  to 100 mg daily.  To follow up in 4 months to reassess Will monitor BP and HR regularly and log to provide to PCP Will report side effects or worsening symptoms and seek medical attention immediately if BP rises above 180.  Discussed potential benefits, risks, and side effects of stimulants with patient to include increased heart rate, palpitations, insomnia, increased anxiety, increased irritability, or decreased appetite.  Instructed patient to contact office if experiencing any  significant tolerability issues.  Reviewed PDMP         Redell DELENA Pizza, NP

## 2024-03-04 ENCOUNTER — Other Ambulatory Visit (HOSPITAL_COMMUNITY): Payer: Self-pay

## 2024-03-06 ENCOUNTER — Other Ambulatory Visit (HOSPITAL_COMMUNITY): Payer: Self-pay

## 2024-03-16 NOTE — Telephone Encounter (Signed)
 Provider changed to the Testosterone  injection

## 2024-03-17 ENCOUNTER — Other Ambulatory Visit (HOSPITAL_COMMUNITY): Payer: Self-pay

## 2024-04-06 ENCOUNTER — Other Ambulatory Visit (HOSPITAL_COMMUNITY)

## 2024-04-07 ENCOUNTER — Other Ambulatory Visit (HOSPITAL_COMMUNITY)
Admission: RE | Admit: 2024-04-07 | Discharge: 2024-04-07 | Disposition: A | Discharge status: Home / Self Care | Payer: Self-pay | Source: Ambulatory Visit | Attending: Medical Genetics | Admitting: Medical Genetics

## 2024-04-13 ENCOUNTER — Encounter: Payer: Self-pay | Admitting: Radiology

## 2024-04-14 LAB — GENECONNECT MOLECULAR SCREEN: Genetic Analysis Overall Interpretation: NEGATIVE

## 2024-05-12 ENCOUNTER — Other Ambulatory Visit: Payer: Self-pay | Admitting: Internal Medicine

## 2024-05-12 ENCOUNTER — Other Ambulatory Visit: Payer: Self-pay

## 2024-05-12 ENCOUNTER — Other Ambulatory Visit (HOSPITAL_COMMUNITY): Payer: Self-pay

## 2024-05-12 DIAGNOSIS — G4733 Obstructive sleep apnea (adult) (pediatric): Secondary | ICD-10-CM

## 2024-05-13 ENCOUNTER — Other Ambulatory Visit (HOSPITAL_COMMUNITY): Payer: Self-pay

## 2024-05-13 ENCOUNTER — Other Ambulatory Visit: Payer: Self-pay

## 2024-05-13 NOTE — Telephone Encounter (Signed)
 Lvm to schedule f/u for refill

## 2024-05-18 ENCOUNTER — Other Ambulatory Visit: Payer: Self-pay

## 2024-05-18 ENCOUNTER — Encounter (HOSPITAL_COMMUNITY): Payer: Self-pay | Admitting: Surgery

## 2024-05-21 ENCOUNTER — Other Ambulatory Visit: Payer: Self-pay

## 2024-05-28 ENCOUNTER — Other Ambulatory Visit: Payer: Self-pay

## 2024-06-01 ENCOUNTER — Encounter: Payer: Self-pay | Admitting: Internal Medicine

## 2024-06-03 ENCOUNTER — Other Ambulatory Visit: Payer: Self-pay | Admitting: Internal Medicine

## 2024-06-03 DIAGNOSIS — I10 Essential (primary) hypertension: Secondary | ICD-10-CM

## 2024-06-05 ENCOUNTER — Other Ambulatory Visit: Payer: Self-pay

## 2024-06-05 ENCOUNTER — Other Ambulatory Visit: Payer: Self-pay | Admitting: Internal Medicine

## 2024-06-05 ENCOUNTER — Other Ambulatory Visit (HOSPITAL_COMMUNITY): Payer: Self-pay

## 2024-06-05 ENCOUNTER — Other Ambulatory Visit (HOSPITAL_BASED_OUTPATIENT_CLINIC_OR_DEPARTMENT_OTHER): Payer: Self-pay

## 2024-06-05 DIAGNOSIS — G4733 Obstructive sleep apnea (adult) (pediatric): Secondary | ICD-10-CM

## 2024-06-05 DIAGNOSIS — E230 Hypopituitarism: Secondary | ICD-10-CM

## 2024-06-05 MED ORDER — XYOSTED 75 MG/0.5ML ~~LOC~~ SOAJ: 75.0000 mg | SUBCUTANEOUS | 0 refills | Status: DC

## 2024-06-05 MED ORDER — TORSEMIDE 20 MG PO TABS
20.0000 mg | ORAL_TABLET | Freq: Every day | ORAL | 0 refills | Status: AC
  Filled 2024-06-05: qty 90, 90d supply, fill #0

## 2024-06-05 MED ORDER — ZEPBOUND 2.5 MG/0.5ML ~~LOC~~ SOAJ
2.5000 mg | SUBCUTANEOUS | 0 refills | Status: AC
  Filled 2024-06-05: qty 2, 28d supply, fill #0

## 2024-06-08 ENCOUNTER — Other Ambulatory Visit (HOSPITAL_COMMUNITY): Payer: Self-pay

## 2024-06-08 ENCOUNTER — Other Ambulatory Visit: Payer: Self-pay

## 2024-06-09 ENCOUNTER — Other Ambulatory Visit: Payer: Self-pay

## 2024-06-09 ENCOUNTER — Other Ambulatory Visit (HOSPITAL_COMMUNITY): Payer: Self-pay

## 2024-06-09 DIAGNOSIS — E230 Hypopituitarism: Secondary | ICD-10-CM

## 2024-06-09 MED ORDER — XYOSTED 75 MG/0.5ML ~~LOC~~ SOAJ
75.0000 mg | SUBCUTANEOUS | 0 refills | Status: AC
  Filled 2024-06-09: qty 6, 84d supply, fill #0

## 2024-06-15 ENCOUNTER — Other Ambulatory Visit: Payer: Self-pay

## 2024-06-15 ENCOUNTER — Other Ambulatory Visit (HOSPITAL_COMMUNITY): Payer: Self-pay

## 2024-06-16 ENCOUNTER — Other Ambulatory Visit: Payer: Self-pay

## 2024-06-19 ENCOUNTER — Other Ambulatory Visit: Payer: Self-pay | Admitting: Internal Medicine

## 2024-06-19 ENCOUNTER — Other Ambulatory Visit: Payer: Self-pay

## 2024-06-19 DIAGNOSIS — I10 Essential (primary) hypertension: Secondary | ICD-10-CM

## 2024-06-19 MED ORDER — VALSARTAN 320 MG PO TABS
320.0000 mg | ORAL_TABLET | Freq: Every day | ORAL | 0 refills | Status: AC
  Filled 2024-06-19: qty 90, 90d supply, fill #0

## 2024-06-29 ENCOUNTER — Ambulatory Visit (INDEPENDENT_AMBULATORY_CARE_PROVIDER_SITE_OTHER): Payer: PRIVATE HEALTH INSURANCE | Admitting: Behavioral Health

## 2024-06-29 ENCOUNTER — Other Ambulatory Visit (HOSPITAL_COMMUNITY): Payer: Self-pay

## 2024-06-29 ENCOUNTER — Other Ambulatory Visit: Payer: Self-pay

## 2024-06-29 ENCOUNTER — Encounter: Payer: Self-pay | Admitting: Behavioral Health

## 2024-06-29 DIAGNOSIS — F331 Major depressive disorder, recurrent, moderate: Secondary | ICD-10-CM

## 2024-06-29 DIAGNOSIS — F411 Generalized anxiety disorder: Secondary | ICD-10-CM | POA: Diagnosis not present

## 2024-06-29 DIAGNOSIS — F902 Attention-deficit hyperactivity disorder, combined type: Secondary | ICD-10-CM | POA: Diagnosis not present

## 2024-06-29 MED ORDER — SERTRALINE HCL 100 MG PO TABS
100.0000 mg | ORAL_TABLET | Freq: Every day | ORAL | 1 refills | Status: AC
  Filled 2024-06-29: qty 90, 90d supply, fill #0

## 2024-06-29 MED ORDER — AMPHETAMINE-DEXTROAMPHETAMINE 30 MG PO TABS
30.0000 mg | ORAL_TABLET | Freq: Two times a day (BID) | ORAL | 0 refills | Status: AC
  Filled 2024-06-29: qty 60, 30d supply, fill #0

## 2024-06-29 NOTE — Progress Notes (Signed)
 "     [facility]  Patient ID: Frederick Snyder,  MRN: [id number]  PCP: Joshua Debby CROME, MD  Date of Evaluation: 06/29/2024 Time spent:30 minutes  Chief Complaint:  Chief Complaint   ADHD; Depression; Anxiety; Follow-up; Medication Refill; Patient Education     HISTORY/CURRENT STATUS: HPI Frederick Snyder, 39 year old male presents to this office for follow up and medication management.  Smiling and very pleasant today.  Would like to discuss weaning off his sertraline .  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 Maintaining weight loss.  Overall continues to be happy with current medication regimen. Anxiety 2/10, Depression is 2/10. Says he is sleeping 7-8 hours per night. Requesting no medication changes today. Denies mania, no psychosis, no SI/HI.   Previous psychiatric medications: Ritalin Vyvanse  Adderall  Zoloft  Individual Medical History/ Review of Systems: Changes? :No   Allergies: Nitrous oxide and Nsaids  Current Medications: Current Medications[1] Medication Side Effects: none  Family Medical/ Social History: Changes? No  MENTAL HEALTH EXAM:  There were no vitals taken for this visit.There is no height or weight on file to calculate BMI.  General Appearance: Casual, Neat, and Well Groomed  Eye Contact:  Good  Speech:  Clear and Coherent  Volume:  Normal  Mood:  NA  Affect:  Appropriate  Thought Process:  Coherent  Orientation:  Full (Time, Place, and Person)  Thought Content: Logical   Suicidal Thoughts:  No  Homicidal Thoughts:  No  Memory:  WNL  Judgement:  Good  Insight:  Good  Psychomotor Activity:  Normal  Concentration:  Concentration: Good  Recall:  Good  Fund of Knowledge: Good  Language: Good  Assets:  Desire for Improvement  ADL's:  Intact  Cognition: WNL  Prognosis:  Good    DIAGNOSES:    ICD-10-CM   1. Major depressive disorder, recurrent episode, moderate (HCC)  F33.1 sertraline  (ZOLOFT ) 100 MG tablet    2. Generalized anxiety  disorder  F41.1 sertraline  (ZOLOFT ) 100 MG tablet    3. Attention deficit hyperactivity disorder (ADHD), combined type  F90.2 amphetamine -dextroamphetamine  (ADDERALL) 30 MG tablet      Receiving Psychotherapy: No    RECOMMENDATIONS:  Greater than 50% of 30  min face to face time with patient was spent on counseling and coordination of care.  We discussed his good stability since last visit.  He had questions about wanting to wean off sertraline .  Encouraged him to do it very slowly over time since he has been very stable with anxiety and depression.  He agrees to reduce dose for the next few months and then reassess.   To continue Adderall to 30 mg IR twice daily To reduce Zoloft  to 50 mg daily.  To follow up in 3 months to reassess Will monitor BP and HR regularly and log to provide to PCP Provided emergency contact information Will report side effects or worsening symptoms and seek medical attention immediately if BP rises above 180.  Discussed potential benefits, risks, and side effects of stimulants with patient to include increased heart rate, palpitations, insomnia, increased anxiety, increased irritability, or decreased appetite.  Instructed patient to contact office if experiencing any significant tolerability issues.  Reviewed PDMP   Redell DELENA Pizza, NP     [1]  Current Outpatient Medications:    amphetamine -dextroamphetamine  (ADDERALL) 30 MG tablet, Take 1 tablet by mouth 2 (two) times daily., Disp: 60 tablet, Rfl: 0   Multiple Vitamin (MULTIVITAMIN) tablet, Take 1 tablet by mouth daily., Disp: , Rfl:  NEEDLE, DISP, 18 G 18G X 1-1/2 MISC, 1 Act by Does not apply route every 14 (fourteen) days., Disp: 50 each, Rfl: 0   NEEDLE, DISP, 21 G 21G X 1-1/2 MISC, 1 Act by Does not apply route every 14 (fourteen) days., Disp: 50 each, Rfl: 0   sertraline  (ZOLOFT ) 100 MG tablet, Take 1 tablet (100 mg total) by mouth daily., Disp: 90 tablet, Rfl: 1   Syringe, Disposable, (2-3CC  SYRINGE) 3 ML MISC, 1 Act by Does not apply route every 14 (fourteen) days., Disp: 100 each, Rfl: 0   testosterone  cypionate (DEPOTESTOSTERONE CYPIONATE) 200 MG/ML injection, Inject 1 mL (200 mg total) into the muscle every 14 (fourteen) days., Disp: 10 mL, Rfl: 0   torsemide  (DEMADEX ) 20 MG tablet, Take 1 tablet (20 mg total) by mouth daily., Disp: 90 tablet, Rfl: 0   valsartan  (DIOVAN ) 320 MG tablet, Take 1 tablet (320 mg total) by mouth daily., Disp: 90 tablet, Rfl: 0   XYOSTED  75 MG/0.5ML SOAJ, Inject 75 mg into the skin once a week., Disp: 6 mL, Rfl: 0   ZEPBOUND  2.5 MG/0.5ML Pen, Inject 2.5 mg into the skin once a week., Disp: 2 mL, Rfl: 0  "

## 2024-06-30 ENCOUNTER — Encounter: Payer: Self-pay | Admitting: Pharmacist

## 2024-06-30 ENCOUNTER — Other Ambulatory Visit: Payer: Self-pay

## 2024-06-30 NOTE — Progress Notes (Unsigned)
 SABRA

## 2024-07-01 ENCOUNTER — Encounter: Payer: Self-pay | Admitting: Adult Health

## 2024-07-01 ENCOUNTER — Ambulatory Visit: Payer: Self-pay | Admitting: Adult Health

## 2024-07-03 ENCOUNTER — Other Ambulatory Visit: Payer: Self-pay

## 2024-09-21 ENCOUNTER — Ambulatory Visit: Admitting: Behavioral Health
